# Patient Record
Sex: Male | Born: 1985
Health system: Southern US, Community
[De-identification: ages and names within clinical notes are randomized; demographics above are authoritative.]

## PROBLEM LIST (undated history)

## (undated) DIAGNOSIS — F250 Schizoaffective disorder, bipolar type: Secondary | ICD-10-CM

## (undated) DIAGNOSIS — F259 Schizoaffective disorder, unspecified: Secondary | ICD-10-CM

## (undated) DIAGNOSIS — K069 Disorder of gingiva and edentulous alveolar ridge, unspecified: Secondary | ICD-10-CM

## (undated) DIAGNOSIS — F329 Major depressive disorder, single episode, unspecified: Secondary | ICD-10-CM

## (undated) DIAGNOSIS — F32A Depression, unspecified: Secondary | ICD-10-CM

## (undated) HISTORY — PX: NO PAST SURGERIES: SHX2092

---
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
## 2005-12-18 ENCOUNTER — Emergency Department: Payer: Self-pay | Admitting: Emergency Medicine

## 2009-02-27 ENCOUNTER — Emergency Department (HOSPITAL_COMMUNITY): Admission: EM | Admit: 2009-02-27 | Discharge: 2009-02-27 | Payer: Self-pay | Admitting: Emergency Medicine

## 2009-09-02 ENCOUNTER — Emergency Department (HOSPITAL_COMMUNITY): Admission: EM | Admit: 2009-09-02 | Discharge: 2009-09-02 | Payer: Self-pay | Admitting: Emergency Medicine

## 2009-09-13 ENCOUNTER — Emergency Department (HOSPITAL_COMMUNITY): Admission: EM | Admit: 2009-09-13 | Discharge: 2009-09-13 | Payer: Self-pay | Admitting: Emergency Medicine

## 2011-11-03 ENCOUNTER — Emergency Department (HOSPITAL_COMMUNITY)
Admission: EM | Admit: 2011-11-03 | Discharge: 2011-11-03 | Disposition: A | Discharge status: Home / Self Care | Payer: Self-pay | Attending: Emergency Medicine | Admitting: Emergency Medicine

## 2011-11-03 ENCOUNTER — Emergency Department (HOSPITAL_COMMUNITY): Payer: Self-pay

## 2011-11-03 ENCOUNTER — Encounter (HOSPITAL_COMMUNITY): Payer: Self-pay | Admitting: *Deleted

## 2011-11-03 DIAGNOSIS — Z888 Allergy status to other drugs, medicaments and biological substances status: Secondary | ICD-10-CM | POA: Insufficient documentation

## 2011-11-03 DIAGNOSIS — L02519 Cutaneous abscess of unspecified hand: Secondary | ICD-10-CM | POA: Insufficient documentation

## 2011-11-03 DIAGNOSIS — L03019 Cellulitis of unspecified finger: Secondary | ICD-10-CM | POA: Insufficient documentation

## 2011-11-03 DIAGNOSIS — F17200 Nicotine dependence, unspecified, uncomplicated: Secondary | ICD-10-CM | POA: Insufficient documentation

## 2011-11-03 MED ORDER — HYDROCODONE-IBUPROFEN 7.5-200 MG PO TABS: 1.0000 | ORAL_TABLET | Freq: Four times a day (QID) | ORAL | Status: AC | PRN

## 2011-11-03 MED ORDER — CEPHALEXIN 500 MG PO CAPS: 500.0000 mg | ORAL_CAPSULE | Freq: Four times a day (QID) | ORAL | Status: AC

## 2011-11-03 MED ORDER — LIDOCAINE HCL (PF) 1 % IJ SOLN
INTRAMUSCULAR | Status: AC
  Filled 2011-11-03: qty 5

## 2011-11-03 NOTE — ED Notes (Signed)
Pt states that a piece of wood fell on left index finger on Sunday, pt entire left index finger swollen, redness noted to extend from the tip of finger to first knuckle area. Pt able to move finger slightly due to pain. Cap refill noted.

## 2011-11-03 NOTE — ED Provider Notes (Signed)
History     CSN: [id number]  Arrival date & time 11/03/11  1453   First MD Initiated Contact with Patient 11/03/11 1712      Chief Complaint  Patient presents with  . Finger Injury    (Consider location/radiation/quality/duration/timing/severity/associated sxs/prior treatment) HPI Comments: Dropped piece of wood on finger 2 days ago.  Now is red and swollen.  No fevers or chills.    The history is provided by the patient.    History reviewed. No pertinent past medical history.  History reviewed. No pertinent past surgical history.  No family history on file.  History  Substance Use Topics  . Smoking status: Current Everyday Smoker  . Smokeless tobacco: Not on file  . Alcohol Use: No      Review of Systems  All other systems reviewed and are negative.    Allergies  Other and Tylenol with codeine #3  Home Medications   Current Outpatient Rx  Name Route Sig Dispense Refill  . IBUPROFEN 200 MG PO TABS Oral Take 200-400 mg by mouth daily as needed. For pain      BP 140/83  Pulse 79  Temp 98.3 F (36.8 C)  Resp 20  Ht 5\' 9"  (1.753 m)  Wt 205 lb (92.987 kg)  BMI 30.27 kg/m2  SpO2 99%  Physical Exam  Nursing note and vitals reviewed. Constitutional: He is oriented to person, place, and time. He appears well-developed and well-nourished.  HENT:  Head: Normocephalic and atraumatic.  Musculoskeletal: Normal range of motion.       The left index finger is markedly erythematous and swollen.  There is a small scab on the dorsal aspect of the dip joint, but no obvious splinter or fb.  Neurological: He is alert and oriented to person, place, and time.  Skin: Skin is warm and dry.    ED Course  Procedures (including critical care time)  Labs Reviewed - No data to display Dg Finger Index Left  11/03/2011  *RADIOLOGY REPORT*  Clinical Data: Pain, swelling, redness, injury  LEFT INDEX FINGER 2+V  Comparison: None.  Findings: Diffuse left index finger soft  tissue swelling.  Normal alignment.  No fracture evident.  Preserved joint spaces.  No radiopaque foreign body.  IMPRESSION: Soft tissue swelling.  No acute osseous finding   Original Report Authenticated By: Judie Petit. Ruel Favors, M.D.      No diagnosis found.    MDM  I attempted to drain pus with lidocaine and a #11 blade, however none was expressed.  I doubt a foreign body, but there is definite cellulitis.  Will treat with keflex, lortab, return prn if worsens.        Geoffery Lyons, MD 11/03/11 [phone]

## 2012-06-29 LAB — SALICYLATE LEVEL: Salicylates, Serum: 3.4 mg/dL — ABNORMAL HIGH

## 2012-06-29 LAB — COMPREHENSIVE METABOLIC PANEL
Albumin: 4.4 g/dL (ref 3.4–5.0)
BUN: 13 mg/dL (ref 7–18)
Bilirubin,Total: 0.5 mg/dL (ref 0.2–1.0)
Calcium, Total: 9.4 mg/dL (ref 8.5–10.1)
Chloride: 107 mmol/L (ref 98–107)
EGFR (Non-African Amer.): >60
Potassium: 3.7 mmol/L (ref 3.5–5.1)
SGOT(AST): 33 U/L (ref 15–37)
Sodium: 139 mmol/L (ref 136–145)
Total Protein: 7.7 g/dL (ref 6.4–8.2)

## 2012-06-29 LAB — ETHANOL
Ethanol %: <0.003 % (ref 0.000–0.080)
Ethanol: <3 mg/dL

## 2012-06-29 LAB — CBC
HCT: 43 % (ref 40.0–52.0)
MCH: 31 pg (ref 26.0–34.0)
MCHC: 33.9 g/dL (ref 32.0–36.0)
MCV: 92 fL (ref 80–100)
WBC: 12.5 10*3/uL — ABNORMAL HIGH (ref 3.8–10.6)

## 2012-06-29 LAB — ACETAMINOPHEN LEVEL: Acetaminophen: <2 ug/mL

## 2012-06-29 LAB — TSH: Thyroid Stimulating Horm: 0.44 u[IU]/mL — ABNORMAL LOW

## 2012-06-30 ENCOUNTER — Inpatient Hospital Stay: Payer: Self-pay | Admitting: Psychiatry

## 2012-06-30 LAB — DRUG SCREEN, URINE
Amphetamines, Ur Screen: NEGATIVE (ref ?–1000)
Barbiturates, Ur Screen: NEGATIVE (ref ?–200)
Benzodiazepine, Ur Scrn: NEGATIVE (ref ?–200)
Cannabinoid 50 Ng, Ur ~~LOC~~: NEGATIVE (ref ?–50)
Cocaine Metabolite,Ur ~~LOC~~: NEGATIVE (ref ?–300)
Methadone, Ur Screen: NEGATIVE (ref ?–300)
Phencyclidine (PCP) Ur S: NEGATIVE (ref ?–25)
Tricyclic, Ur Screen: NEGATIVE (ref ?–1000)

## 2012-06-30 LAB — URINALYSIS, COMPLETE
Blood: NEGATIVE
Glucose,UR: NEGATIVE mg/dL (ref 0–75)
Leukocyte Esterase: NEGATIVE
Nitrite: NEGATIVE
Protein: 30
RBC,UR: NONE SEEN /HPF (ref 0–5)
Specific Gravity: 1.035 (ref 1.003–1.030)
Squamous Epithelial: 2

## 2012-07-01 LAB — BEHAVIORAL MEDICINE 1 PANEL
Albumin: 3.8 g/dL (ref 3.4–5.0)
Alkaline Phosphatase: 66 U/L (ref 50–136)
Anion Gap: 7 (ref 7–16)
BUN: 15 mg/dL (ref 7–18)
Basophil #: 0.1 10*3/uL (ref 0.0–0.1)
Basophil %: 1 %
Bilirubin,Total: 0.8 mg/dL (ref 0.2–1.0)
Calcium, Total: 9.1 mg/dL (ref 8.5–10.1)
Chloride: 104 mmol/L (ref 98–107)
Co2: 27 mmol/L (ref 21–32)
Creatinine: 0.78 mg/dL (ref 0.60–1.30)
EGFR (African American): >60
EGFR (Non-African Amer.): >60
Eosinophil #: 0.2 10*3/uL (ref 0.0–0.7)
Eosinophil %: 2.9 %
Glucose: 90 mg/dL (ref 65–99)
HCT: 42.6 % (ref 40.0–52.0)
HGB: 15 g/dL (ref 13.0–18.0)
Lymphocyte #: 2.5 10*3/uL (ref 1.0–3.6)
Lymphocyte %: 35.7 %
MCH: 32.2 pg (ref 26.0–34.0)
MCHC: 35.1 g/dL (ref 32.0–36.0)
MCV: 92 fL (ref 80–100)
Monocyte #: 0.8 x10 3/mm (ref 0.2–1.0)
Monocyte %: 12.3 %
Neutrophil #: 3.3 10*3/uL (ref 1.4–6.5)
Neutrophil %: 48.1 %
Osmolality: 276 (ref 275–301)
Platelet: 232 10*3/uL (ref 150–440)
Potassium: 4.2 mmol/L (ref 3.5–5.1)
RBC: 4.65 10*6/uL (ref 4.40–5.90)
RDW: 13.3 % (ref 11.5–14.5)
SGOT(AST): 42 U/L — ABNORMAL HIGH (ref 15–37)
SGPT (ALT): 24 U/L (ref 12–78)
Sodium: 138 mmol/L (ref 136–145)
Thyroid Stimulating Horm: 0.34 u[IU]/mL — ABNORMAL LOW
Total Protein: 6.9 g/dL (ref 6.4–8.2)
WBC: 6.9 10*3/uL (ref 3.8–10.6)

## 2012-07-01 LAB — URINALYSIS, COMPLETE
Bilirubin,UR: NEGATIVE
Blood: NEGATIVE
Glucose,UR: NEGATIVE mg/dL (ref 0–75)
Ketone: NEGATIVE
Leukocyte Esterase: NEGATIVE
Nitrite: NEGATIVE
Ph: 6 (ref 4.5–8.0)
Protein: 30
RBC,UR: 1 /HPF (ref 0–5)
Specific Gravity: 1.033 (ref 1.003–1.030)
Squamous Epithelial: NONE SEEN
WBC UR: 1 /HPF (ref 0–5)

## 2012-12-28 ENCOUNTER — Emergency Department (HOSPITAL_COMMUNITY)
Admission: EM | Admit: 2012-12-28 | Discharge: 2012-12-28 | Disposition: A | Discharge status: Home / Self Care | Payer: Self-pay | Attending: Emergency Medicine | Admitting: Emergency Medicine

## 2012-12-28 ENCOUNTER — Emergency Department (HOSPITAL_COMMUNITY): Payer: Self-pay

## 2012-12-28 ENCOUNTER — Encounter (HOSPITAL_COMMUNITY): Payer: Self-pay | Admitting: Emergency Medicine

## 2012-12-28 DIAGNOSIS — J3489 Other specified disorders of nose and nasal sinuses: Secondary | ICD-10-CM | POA: Insufficient documentation

## 2012-12-28 DIAGNOSIS — M542 Cervicalgia: Secondary | ICD-10-CM | POA: Insufficient documentation

## 2012-12-28 DIAGNOSIS — R0989 Other specified symptoms and signs involving the circulatory and respiratory systems: Secondary | ICD-10-CM

## 2012-12-28 DIAGNOSIS — F458 Other somatoform disorders: Secondary | ICD-10-CM | POA: Insufficient documentation

## 2012-12-28 DIAGNOSIS — F17200 Nicotine dependence, unspecified, uncomplicated: Secondary | ICD-10-CM | POA: Insufficient documentation

## 2012-12-28 DIAGNOSIS — Z8659 Personal history of other mental and behavioral disorders: Secondary | ICD-10-CM | POA: Insufficient documentation

## 2012-12-28 DIAGNOSIS — R09A2 Foreign body sensation, throat: Secondary | ICD-10-CM

## 2012-12-28 MED ORDER — LORAZEPAM 1 MG PO TABS
1.0000 mg | ORAL_TABLET | Freq: Once | ORAL | Status: AC
  Administered 2012-12-28: 1 mg via ORAL
  Filled 2012-12-28: qty 1

## 2012-12-28 MED ORDER — LORAZEPAM 1 MG PO TABS: 1.0000 mg | ORAL_TABLET | Freq: Two times a day (BID) | ORAL | Status: DC

## 2012-12-28 NOTE — ED Notes (Signed)
Pt brought to room, pt placed in gown, on monitor, continuous pulse oximetry and blood pressure cuff; family at bedside

## 2012-12-28 NOTE — ED Provider Notes (Signed)
CSN: [id number]     Arrival date & time 12/28/12  1348 History   First MD Initiated Contact with Patient 12/28/12 1506     Chief Complaint  Patient presents with  . Shortness of Breath  . Anxiety  . Neck Pain   (Consider location/radiation/quality/duration/timing/severity/associated sxs/prior Treatment) HPI Comments: Patient presents emergency department with chief complaint of neck tightness. Patient states the symptoms began approximately 3 days ago. States that he has had some shortness of breath. Says he feels like someone has a hand on his throat. Denies fever, cough, chest pain.  Endorses nasal congestion.  He has a history of depression and psychosis.  Believes the sensation in his throat is related to his medications.  The history is provided by the patient. No language interpreter was used.    History reviewed. No pertinent past medical history. History reviewed. No pertinent past surgical history. History reviewed. No pertinent family history. History  Substance Use Topics  . Smoking status: Current Every Day Smoker  . Smokeless tobacco: Not on file  . Alcohol Use: No    Review of Systems  All other systems reviewed and are negative.    Allergies  Other and Tylenol with codeine #3  Home Medications   Current Outpatient Rx  Name  Route  Sig  Dispense  Refill  . bismuth subsalicylate (PEPTO BISMOL) 262 MG/15ML suspension   Oral   Take 30 mLs by mouth every 6 (six) hours as needed for indigestion.         [name] ibuprofen (ADVIL,MOTRIN) 200 MG tablet   Oral   Take 200-400 mg by mouth daily as needed. For pain          BP 140/100  Pulse 75  Temp(Src) 97.9 F (36.6 C) (Oral)  Resp 20  Ht 5\' 9"  (1.753 m)  Wt 230 lb (104.327 kg)  BMI 33.95 kg/m2  SpO2 100% Physical Exam  Nursing note and vitals reviewed. Constitutional: He is oriented to person, place, and time. He appears well-developed and well-nourished.  HENT:  Head: Normocephalic and atraumatic.   Right Ear: External ear normal.  Left Ear: External ear normal.  Nose: Nose normal.  Mouth/Throat: Oropharynx is clear and moist. No oropharyngeal exudate.  Airway is clear No exudate or signs of abscess  Eyes: Conjunctivae and EOM are normal. Pupils are equal, round, and reactive to light. Right eye exhibits no discharge. Left eye exhibits no discharge. No scleral icterus.  Neck: Normal range of motion. Neck supple. No JVD present.  Cardiovascular: Normal rate, regular rhythm, normal heart sounds and intact distal pulses.  Exam reveals no gallop and no friction rub.   No murmur heard. Pulmonary/Chest: Effort normal and breath sounds normal. No respiratory distress. He has no wheezes. He has no rales. He exhibits no tenderness.  Abdominal: Soft. He exhibits no distension and no mass. There is no tenderness. There is no rebound and no guarding.  Musculoskeletal: Normal range of motion. He exhibits no edema and no tenderness.  Neurological: He is alert and oriented to person, place, and time. He has normal reflexes.  CN 3-12 intact  Skin: Skin is warm and dry.  Psychiatric: He has a normal mood and affect. His behavior is normal. Judgment and thought content normal.    ED Course  Procedures (including critical care time) No results found for this or any previous visit. Dg Chest 2 View  12/28/2012   CLINICAL DATA:  Difficulty breathing  EXAM: CHEST  2 VIEW  COMPARISON:  None.  FINDINGS: Lungs are clear. Heart size and pulmonary vascularity are normal. No adenopathy. No bone lesions.  IMPRESSION: No abnormality noted.   Electronically Signed   By: Bretta Bang M.D.   On: 12/28/2012 16:02      EKG Interpretation   None     ED ECG REPORT  I personally interpreted this EKG   Date: 12/28/2012   Rate: 73  Rhythm: normal sinus rhythm  QRS Axis: normal  Intervals: normal  ST/T Wave abnormalities: normal  Conduction Disutrbances:none  Narrative Interpretation:   Old EKG Reviewed:  none available    MDM   1. Globus sensation     Patient with throat tightness.  Believe this to be globus.  Will check CXR and give ativan.  Will re-evaluate.  4:20 PM Patient states that he feels very much improved.  Will discharge with a few ativan.  Recommend follow-up with [facility].  Patient and mother understand and agree with the plan.    Roxy Horseman, PA-C 12/28/12 [phone]

## 2012-12-28 NOTE — ED Notes (Signed)
Per pt sts for the last few days since stopping his depression meds he has been feeling tightness in neck, trouble swallowing and SOB. sts that when he feels like this it builds up anxiety. Pt in no acute distress at triage.

## 2012-12-28 NOTE — ED Notes (Signed)
Pt was seen at [facility] last week and placed on meds  Was sseen again on Monday and meds were changed when pt took the meds together like he was suppose to ,  Last  Night ,he felt tight in chest and got sob . Now he has not taken anything and mom was concerned .

## 2012-12-29 NOTE — ED Provider Notes (Signed)
Medical screening examination/treatment/procedure(s) were performed by non-physician practitioner and as supervising physician I was immediately available for consultation/collaboration.  Flint Melter, MD 12/29/12 [phone]

## 2014-01-24 ENCOUNTER — Emergency Department (HOSPITAL_COMMUNITY)
Admission: EM | Admit: 2014-01-24 | Discharge: 2014-01-25 | Disposition: A | Discharge status: Home / Self Care | Payer: Self-pay | Attending: Emergency Medicine | Admitting: Emergency Medicine

## 2014-01-24 ENCOUNTER — Encounter (HOSPITAL_COMMUNITY): Payer: Self-pay | Admitting: *Deleted

## 2014-01-24 DIAGNOSIS — Z79899 Other long term (current) drug therapy: Secondary | ICD-10-CM | POA: Insufficient documentation

## 2014-01-24 DIAGNOSIS — Z720 Tobacco use: Secondary | ICD-10-CM | POA: Insufficient documentation

## 2014-01-24 DIAGNOSIS — K029 Dental caries, unspecified: Secondary | ICD-10-CM | POA: Insufficient documentation

## 2014-01-24 DIAGNOSIS — T1490XA Injury, unspecified, initial encounter: Secondary | ICD-10-CM

## 2014-01-24 DIAGNOSIS — Y9389 Activity, other specified: Secondary | ICD-10-CM | POA: Insufficient documentation

## 2014-01-24 DIAGNOSIS — Y9289 Other specified places as the place of occurrence of the external cause: Secondary | ICD-10-CM | POA: Insufficient documentation

## 2014-01-24 DIAGNOSIS — L03031 Cellulitis of right toe: Secondary | ICD-10-CM | POA: Insufficient documentation

## 2014-01-24 DIAGNOSIS — W228XXA Striking against or struck by other objects, initial encounter: Secondary | ICD-10-CM | POA: Insufficient documentation

## 2014-01-24 DIAGNOSIS — Y998 Other external cause status: Secondary | ICD-10-CM | POA: Insufficient documentation

## 2014-01-24 NOTE — ED Notes (Signed)
The pt is c/o rt foot pain and swelling he injured it 2 days ago,.  Hyperventilating and nauseated.  He was wearing steel toed shoes when he was injured

## 2014-01-25 ENCOUNTER — Emergency Department (HOSPITAL_COMMUNITY): Payer: Self-pay

## 2014-01-25 MED ORDER — OXYCODONE-ACETAMINOPHEN 5-325 MG PO TABS: 2.0000 | ORAL_TABLET | ORAL | Status: DC | PRN

## 2014-01-25 MED ORDER — CEPHALEXIN 500 MG PO CAPS: 1000.0000 mg | ORAL_CAPSULE | Freq: Two times a day (BID) | ORAL | Status: DC

## 2014-01-25 MED ORDER — OXYCODONE-ACETAMINOPHEN 5-325 MG PO TABS
1.0000 | ORAL_TABLET | Freq: Once | ORAL | Status: AC
  Administered 2014-01-25: 1 via ORAL
  Filled 2014-01-25: qty 1

## 2014-01-25 MED ORDER — SULFAMETHOXAZOLE-TRIMETHOPRIM 800-160 MG PO TABS: 1.0000 | ORAL_TABLET | Freq: Two times a day (BID) | ORAL | Status: AC

## 2014-01-25 MED ORDER — KETOROLAC TROMETHAMINE 60 MG/2ML IM SOLN
60.0000 mg | Freq: Once | INTRAMUSCULAR | Status: AC
  Administered 2014-01-25: 60 mg via INTRAMUSCULAR
  Filled 2014-01-25: qty 2

## 2014-01-25 NOTE — ED Provider Notes (Signed)
CSN: [id number]     Arrival date & time 01/24/14  2338 History   First MD Initiated Contact with Patient 01/24/14 2355     Chief Complaint  Patient presents with  . Foot Injury     (Consider location/radiation/quality/duration/timing/severity/associated sxs/prior Treatment) HPI   28 year old male who presents for evaluation of right foot injury. Patient states 2 weeks ago he accidentally stubbed his right great toe against a dresser. He was wearing steel toe shoes at that time.  He denies any significant pain initially.  He has been outside chopping woods for the past few days.  Sts he is getting over a cold.  2 days ago he was slump down when his R feet was pressing against a wall when he notice pain to R foot.  Report sharp throbbing pain worsening with palpation or movement.  He has tried OTC medication but pain is worsen.  He denies fever, chills, numbness.  Denies hx of diabetes or gout.  No ankle or knee pain.   History reviewed. No pertinent past medical history. History reviewed. No pertinent past surgical history. No family history on file. History  Substance Use Topics  . Smoking status: Current Every Day Smoker  . Smokeless tobacco: Not on file  . Alcohol Use: No    Review of Systems  Constitutional: Negative for fever.  Musculoskeletal: Positive for arthralgias.  Skin: Positive for rash.  Neurological: Negative for numbness.      Allergies  Other and Tylenol with codeine #3  Home Medications   Prior to Admission medications   Medication Sig Start Date End Date Taking? Authorizing Provider  ARIPiprazole (ABILIFY) 15 MG tablet Take 15 mg by mouth daily.    Historical Provider, MD  benztropine (COGENTIN) 1 MG tablet Take 1 mg by mouth daily.    Historical Provider, MD  bismuth subsalicylate (PEPTO BISMOL) 262 MG/15ML suspension Take 30 mLs by mouth every 6 (six) hours as needed for indigestion.    Historical Provider, MD  ibuprofen (ADVIL,MOTRIN) 200 MG tablet Take  200-400 mg by mouth daily as needed. For pain    Historical Provider, MD  LORazepam (ATIVAN) 1 MG tablet Take 1 tablet (1 mg total) by mouth 2 (two) times daily. 12/28/12   Roxy Horsemanobert Browning, PA-C  lurasidone (LATUDA) 40 MG TABS tablet Take 40 mg by mouth daily with breakfast.    Historical Provider, MD  traZODone (DESYREL) 100 MG tablet Take 100 mg by mouth at bedtime.    Historical Provider, MD   BP 157/101 mmHg  Pulse 94  Temp(Src) 97.5 F (36.4 C)  Resp 16  Ht 5\' 9"  (1.753 m)  Wt 240 lb (108.863 kg)  BMI 35.43 kg/m2  SpO2 98% Physical Exam  Constitutional: He appears well-developed and well-nourished. No distress.  HENT:  Head: Atraumatic.  Poor dentition with significant dental decay throughout  Eyes: Conjunctivae are normal.  Neck: Normal range of motion. Neck supple.  Musculoskeletal: He exhibits tenderness (R foot: erythema and warmth noted to 1st-2nd-3rd toes involving metatarsals, exquisitely tender to palpation.  no nail involvement,  no gross deformity.  brisk cap refill).  Pedal pulses palpable bilat.  R ankle with FROM.   Neurological: He is alert.  Skin: No rash noted.  Psychiatric: He has a normal mood and affect.    ED Course  Procedures (including critical care time)  12:17 AM R foot infection, suspicious of cellulitis.  Small fissure noted to web of toe between great toe and second toe, likely portal of  entry.  Since pt report prior injury, i will obtain xray.  Pain medication given. Doubt gout or infected joint.    1:39 AM X-rays shows mild soft tissue swelling but no osteomyelitis or acute bony pathology.  Area of cellulitis was marked and dated.  Care instruction provide.  abx include keflex and bactrim along with pain medication were prescribed. Pt to f/u in 48 hrs for wound recheck, return sooner if worsen.    Labs Review Labs Reviewed - No data to display  Imaging Review Dg Foot Complete Right  01/25/2014   CLINICAL DATA:  Right foot injury with  subacute right foot pain and swelling. Initial encounter.  EXAM: RIGHT FOOT COMPLETE - 3+ VIEW  COMPARISON:  None  FINDINGS: There is no evidence of fracture, subluxation or dislocation.  There is no evidence of osteomyelitis.  Medial and dorsal soft tissue swelling is present.  IMPRESSION: Soft tissue swelling without bony abnormality.   Electronically Signed   By: Laveda AbbeJeff  Hu M.D.   On: 01/25/2014 01:27     EKG Interpretation None      MDM   Final diagnoses:  Cellulitis of toe of right foot    BP 157/101 mmHg  Pulse 94  Temp(Src) 97.5 F (36.4 C)  Resp 16  Ht 5\' 9"  (1.753 m)  Wt 240 lb (108.863 kg)  BMI 35.43 kg/m2  SpO2 98%  I have reviewed nursing notes and vital signs. I personally reviewed the imaging tests through PACS system  I reviewed available ER/hospitalization records thought the EMR     Fayrene HelperBowie Terrica Duecker, PA-C 01/25/14 0141  Loren Raceravid Yelverton, MD 01/25/14 [phone]

## 2014-01-25 NOTE — Discharge Instructions (Signed)
Please take both antibiotics for the full Duration. Take pain medication as needed. Keep foot elevated and soak in warm water with Dial antibacterial soap. Follow-up at urgent care center in 2 days for a wound recheck. Return sooner if you notice condition is worsening.  Cellulitis Cellulitis is an infection of the skin and the tissue beneath it. The infected area is usually red and tender. Cellulitis occurs most often in the arms and lower legs.  CAUSES  Cellulitis is caused by bacteria that enter the skin through cracks or cuts in the skin. The most common types of bacteria that cause cellulitis are staphylococci and streptococci. SIGNS AND SYMPTOMS   Redness and warmth.  Swelling.  Tenderness or pain.  Fever. DIAGNOSIS  Your health care provider can usually determine what is wrong based on a physical exam. Blood tests may also be done. TREATMENT  Treatment usually involves taking an antibiotic medicine. HOME CARE INSTRUCTIONS   Take your antibiotic medicine as directed by your health care provider. Finish the antibiotic even if you start to feel better.  Keep the infected arm or leg elevated to reduce swelling.  Apply a warm cloth to the affected area up to 4 times per day to relieve pain.  Take medicines only as directed by your health care provider.  Keep all follow-up visits as directed by your health care provider. SEEK MEDICAL CARE IF:   You notice red streaks coming from the infected area.  Your red area gets larger or turns dark in color.  Your bone or joint underneath the infected area becomes painful after the skin has healed.  Your infection returns in the same area or another area.  You notice a swollen bump in the infected area.  You develop new symptoms.  You have a fever. SEEK IMMEDIATE MEDICAL CARE IF:   You feel very sleepy.  You develop vomiting or diarrhea.  You have a general ill feeling (malaise) with muscle aches and pains. MAKE SURE YOU:     Understand these instructions.  Will watch your condition.  Will get help right away if you are not doing well or get worse. Document Released: 11/19/2004 Document Revised: 06/26/2013 Document Reviewed: 04/27/2011 [facility] Patient Information 2015 [employer], [place]. This information is not intended to replace advice given to you by your health care provider. Make sure you discuss any questions you have with your health care provider.

## 2014-02-10 ENCOUNTER — Emergency Department (HOSPITAL_COMMUNITY)
Admission: EM | Admit: 2014-02-10 | Discharge: 2014-02-10 | Disposition: A | Discharge status: Home / Self Care | Payer: Self-pay | Attending: Emergency Medicine | Admitting: Emergency Medicine

## 2014-02-10 ENCOUNTER — Encounter (HOSPITAL_COMMUNITY): Payer: Self-pay | Admitting: Emergency Medicine

## 2014-02-10 ENCOUNTER — Emergency Department (HOSPITAL_COMMUNITY): Payer: Self-pay

## 2014-02-10 DIAGNOSIS — R52 Pain, unspecified: Secondary | ICD-10-CM

## 2014-02-10 DIAGNOSIS — M109 Gout, unspecified: Secondary | ICD-10-CM

## 2014-02-10 DIAGNOSIS — M79674 Pain in right toe(s): Secondary | ICD-10-CM | POA: Insufficient documentation

## 2014-02-10 DIAGNOSIS — Z720 Tobacco use: Secondary | ICD-10-CM | POA: Insufficient documentation

## 2014-02-10 DIAGNOSIS — M79671 Pain in right foot: Secondary | ICD-10-CM | POA: Insufficient documentation

## 2014-02-10 DIAGNOSIS — R110 Nausea: Secondary | ICD-10-CM | POA: Insufficient documentation

## 2014-02-10 DIAGNOSIS — Z792 Long term (current) use of antibiotics: Secondary | ICD-10-CM | POA: Insufficient documentation

## 2014-02-10 DIAGNOSIS — M10071 Idiopathic gout, right ankle and foot: Secondary | ICD-10-CM | POA: Insufficient documentation

## 2014-02-10 DIAGNOSIS — Z79899 Other long term (current) drug therapy: Secondary | ICD-10-CM | POA: Insufficient documentation

## 2014-02-10 LAB — I-STAT CHEM 8, ED
BUN: 10 mg/dL (ref 6–23)
CALCIUM ION: 1.23 mmol/L (ref 1.12–1.23)
CREATININE: 0.9 mg/dL (ref 0.50–1.35)
Chloride: 104 mEq/L (ref 96–112)
Glucose, Bld: 120 mg/dL — ABNORMAL HIGH (ref 70–99)
HCT: 46 % (ref 39.0–52.0)
HEMOGLOBIN: 15.6 g/dL (ref 13.0–17.0)
Potassium: 4.1 mEq/L (ref 3.7–5.3)
Sodium: 142 mEq/L (ref 137–147)
TCO2: 22 mmol/L (ref 0–100)

## 2014-02-10 MED ORDER — COLCHICINE 0.6 MG PO TABS
1.2000 mg | ORAL_TABLET | ORAL | Status: AC
  Administered 2014-02-10: 1.2 mg via ORAL
  Filled 2014-02-10: qty 2

## 2014-02-10 MED ORDER — INDOMETHACIN 25 MG PO CAPS: 50.0000 mg | ORAL_CAPSULE | Freq: Three times a day (TID) | ORAL | Status: DC | PRN

## 2014-02-10 NOTE — ED Notes (Signed)
Pt. Stated, I have an infected foot.  I had a sore underneath my toe and the sweat and all caused it to get infected according to the doctor that was here.  Now its really hurting and infected. Its affected my whole foot.

## 2014-02-10 NOTE — ED Provider Notes (Signed)
CSN: [id number]     Arrival date & time 02/10/14  1434 History  This chart was scribed for non-physician practitioner working with Nelia Shiobert L Beaton, MD by Elveria Risingimelie Horne, ED Scribe. This patient was seen in room TR09C/TR09C and the patient's care was started at 2:48 PM.   Chief Complaint  Patient presents with  . Foot Pain   The history is provided by the patient. No language interpreter was used.   HPI Comments: Sheppard EvensJonathan L Kenealy is a 28 y.o. male who presents to the Emergency Department complaining of right foot pain. Patient reports a past callus that developed into an open wound. Patient states he was evaluated three weeks ago and treated for MRSA infection; patient discharged with 10 day course of Keflex. Patient endorses taking the antibiotics are directed. Today patient reports pain primarily located at his right great toe. Patient reports exacerbated pain with applied pressure and presents with his shoe removed. Patient denies heel, leg or calf involvement. Patient reports nausea, but denies vomiting or fever. Patient denies known kidney problems. Patient denies history of HTN or taking any medications regularly.  History reviewed. No pertinent past medical history. History reviewed. No pertinent past surgical history. No family history on file. History  Substance Use Topics  . Smoking status: Current Every Day Smoker  . Smokeless tobacco: Not on file  . Alcohol Use: No    Review of Systems  Constitutional: Negative for fever and chills.  Gastrointestinal: Positive for nausea. Negative for vomiting and diarrhea.  Musculoskeletal: Positive for arthralgias.       Joint pain  Skin: Positive for color change and wound.  Neurological: Negative for weakness and numbness.    Allergies  Other and Tylenol with codeine #3  Home Medications   Prior to Admission medications   Medication Sig Start Date End Date Taking? Authorizing Provider  ARIPiprazole (ABILIFY) 15 MG tablet Take 15 mg  by mouth daily.    Historical Provider, MD  benztropine (COGENTIN) 1 MG tablet Take 1 mg by mouth daily.    Historical Provider, MD  bismuth subsalicylate (PEPTO BISMOL) 262 MG/15ML suspension Take 30 mLs by mouth every 6 (six) hours as needed for indigestion.    Historical Provider, MD  cephALEXin (KEFLEX) 500 MG capsule Take 2 capsules (1,000 mg total) by mouth 2 (two) times daily. 01/25/14   Fayrene HelperBowie Tran, PA-C  ibuprofen (ADVIL,MOTRIN) 200 MG tablet Take 200-400 mg by mouth daily as needed. For pain    Historical Provider, MD  LORazepam (ATIVAN) 1 MG tablet Take 1 tablet (1 mg total) by mouth 2 (two) times daily. 12/28/12   Roxy Horsemanobert Browning, PA-C  lurasidone (LATUDA) 40 MG TABS tablet Take 40 mg by mouth daily with breakfast.    Historical Provider, MD  oxyCODONE-acetaminophen (PERCOCET/ROXICET) 5-325 MG per tablet Take 2 tablets by mouth every 4 (four) hours as needed for severe pain. 01/25/14   Fayrene HelperBowie Tran, PA-C  traZODone (DESYREL) 100 MG tablet Take 100 mg by mouth at bedtime.    Historical Provider, MD   Triage Vitals: BP 134/95 mmHg  Pulse 99  Temp(Src) 97.4 F (36.3 C) (Oral)  Resp 17  Ht 5\' 9"  (1.753 m)  Wt 240 lb (108.863 kg)  BMI 35.43 kg/m2  SpO2 100% Physical Exam  Constitutional: He is oriented to person, place, and time. He appears well-developed and well-nourished. No distress.  HENT:  Head: Normocephalic and atraumatic.  Eyes: EOM are normal.  Neck: Neck supple. No tracheal deviation present.  Cardiovascular: Normal  rate.   Pulmonary/Chest: Effort normal. No respiratory distress.  Musculoskeletal:       Feet:  Neurological: He is alert and oriented to person, place, and time.  Skin: Skin is warm and dry.  Psychiatric: He has a normal mood and affect. His behavior is normal.  Nursing note and vitals reviewed.   ED Course  Procedures (including critical care time)  COORDINATION OF CARE: 3:15 PM- Patient's pain consistent with gout, not an infection. patient will be  treated accordingly. Discussed treatment plan with patient at bedside and patient agreed to plan.   Labs Review Labs Reviewed - No data to display  Imaging Review Dg Foot Complete Right  02/10/2014   CLINICAL DATA:  Three-week history of pain and redness  EXAM: RIGHT FOOT COMPLETE - 3+ VIEW  COMPARISON:  January 25, 2014  FINDINGS: Frontal, oblique, and lateral views were obtained. There is no fracture or dislocation. Joint spaces appear intact. No erosive change or bony destruction. There is apparent soft tissue edema along the volar aspect of the proximal to mid foot. No demonstrable soft tissue abscess. There is a small bone island in the distal aspect of the first distal phalanx.  IMPRESSION: Soft tissue swelling along the volar aspect of the proximal to midfoot region. No abscess appreciable. No bony abnormality. In particular, no fracture or dislocation. No bony destruction. Joint spaces appear intact.   Electronically Signed   By: Bretta BangWilliam  Woodruff M.D.   On: 02/10/2014 16:32     EKG Interpretation None      MDM   Final diagnoses:  Pain  Acute gout of right foot, unspecified cause   28 yo with monoarticular pain, but only minimal swelling and erythema.  He was treated for cellulitis in the past but this seems resolved.  Due to the amount of pain ellicted in the right great toe without evidence of fracture on imaging, will treat as gout as I doubt any joint infection.  He is afebrile and stable. He has good renal function and no known peptic ulcer disease and not receiving concurrent treatment on warfarin. Colchicine given in the ED with instructions to start indomethacin. Discussed that pt should respond to treatment with in 24 hour of begining treatment & likely resolve in 2-3 days. Resources provided to establish care with PCP for follow-up.   I personally performed the services described in this documentation, which was scribed in my presence. The recorded information has been  reviewed and is accurate.  Filed Vitals:   02/10/14 1443 02/10/14 1628  BP: 134/95 138/86  Pulse: 99 86  Temp: 97.4 F (36.3 C) 98.9 F (37.2 C)  TempSrc: Oral Oral  Resp: 17 18  Height: 5\' 9"  (1.753 m)   Weight: 240 lb (108.863 kg)   SpO2: 100% 97%   Meds given in ED:  Medications  colchicine tablet 1.2 mg (1.2 mg Oral Given 02/10/14 1628)    Discharge Medication List as of 02/10/2014  4:34 PM     02/10/14 0000  indomethacin (INDOCIN) 25 MG capsule 3 times daily PRN Discontinue Reprint 02/10/14 1636           Harle BattiestElizabeth Rosalio Catterton, NP 02/12/14 [id number]  Nelia Shiobert L Beaton, MD 02/19/14 1511

## 2014-02-10 NOTE — Discharge Instructions (Signed)
Please follow directions provided. Use the referral or the resource guide provided to establish care with a primary care doctor so you can follow-up on this pain. Take the indomethacin as directed. Avoid aspirin, alcohol, or red meats as this may cause another flareup of this pain. Don't hesitate to return for any new, worsening, or concerning symptoms.  SEEK MEDICAL CARE IF:  You develop diarrhea, vomiting, or any side effects from medicines.  You do not feel better in 24 hours, or you are getting worse. SEEK IMMEDIATE MEDICAL CARE IF:  Your joint becomes suddenly more tender, and you have chills or a fever.   Emergency Department Resource Guide 1) Find a Doctor and Pay Out of Pocket Although you won't have to find out who is covered by your insurance plan, it is a good idea to ask around and get recommendations. You will then need to call the office and see if the doctor you have chosen will accept you as a new patient and what types of options they offer for patients who are self-pay. Some doctors offer discounts or will set up payment plans for their patients who do not have insurance, but you will need to ask so you aren't surprised when you get to your appointment.  2) Contact Your Local Health Department Not all health departments have doctors that can see patients for sick visits, but many do, so it is worth a call to see if yours does. If you don't know where your local health department is, you can check in your phone book. The CDC also has a tool to help you locate your state's health department, and many state websites also have listings of all of their local health departments.  3) Find a Walk-in Clinic If your illness is not likely to be very severe or complicated, you may want to try a walk in clinic. These are popping up all over the country in pharmacies, drugstores, and shopping centers. They're usually staffed by nurse practitioners or physician assistants that have been trained  to treat common illnesses and complaints. They're usually fairly quick and inexpensive. However, if you have serious medical issues or chronic medical problems, these are probably not your best option.  No Primary Care Doctor: - Call Health Connect at  [phone] - they can help you locate a primary care doctor that  accepts your insurance, provides certain services, etc. - Physician Referral Service- [phone]  Chronic Pain Problems: Organization         Address  Phone   Notes  [facility]  [phone] Patients need to be referred by their primary care doctor.   Medication Assistance: Organization         Address  Phone   Notes  [facility] Medication [facility] [address] [place], [place] [address] [phone] --Must be a resident of [facility] [place] -- Must have NO insurance coverage whatsoever (no Medicaid/ Medicare, etc.) -- The pt. MUST have a primary care doctor that directs their care regularly and follows them in the community   MedAssist  [phone]   [employer]  [phone]    Agencies that provide inexpensive medical care: Organization         Address  Phone   Notes  [facility]  [phone]   [facility]    [phone]   [facility] [address] [place], [place] [address] [phone]  [facility] [address] [place] [phone]   Planned Parenthood    [phone]   [facility]    [phone]   [place] [address], [place] Phone:  [phone], Fax:  [phone] Hours of Operation:  9 am - 6 pm, M-F.  Also accepts Medicaid/Medicare and self-pay.  [facility] for [place], [address], [place] Phone: [phone], Fax: [phone]. Hours of Operation:  8:30 am - 5:30 pm, M-F.  Also accepts Medicaid and self-pay.   [place] [address], [place] Phone: [phone]   [place], [place], [place] [phone], Ext. [phone] Mondays & Thursdays: 7-9 AM.  First 15 patients are seen on a first come, first serve basis.    [place] Providers:  Organization         Address  Phone   Notes  [facility] [address], [place] [phone] Also accepts self-pay patients.  [facility] [address], [place]  [phone]   [place], [address], [place] [phone]   [facility] [address] [place] [phone]   Lucianne Lei [address], [place]   [phone] Only accepts [place] Access [place] patients after they have their name applied to their card.   Self-Pay (no insurance) in [facility]:  Organization         Address  Phone   Notes  Sickle Cell Patients, [facility] [place] [phone]   [facility] [place] [phone]   [facility] [place]  [place], [place], [place] [phone]   [facility]/Dr. Osei-Bonsu  [address], [place] or [address], [place] [phone] Phone number for both [place] and [place] locations is the same.  [facility]gent [facility] and [facility] [address] [place] [phone]   [facility] [address], [place] or [address] [phone]   [facility] [address], [place] [phone], phone; [phone], fax Sees patients 1st and 3rd Saturday of every month.  Must not qualify for public or private insurance (i.e. Medicaid, Medicare, [place] Health Choice, Veterans' Benefits)  Household income should be no  more than 200% of the poverty level The clinic cannot treat you if you are pregnant or think you are pregnant  Sexually transmitted diseases are not treated at the clinic.    Dental Care: Organization         Address  Phone  Notes  [facility] of [facility] [phone] Accepts children up to age 58 who are enrolled in [place] or [place]; pregnant women with a Medicaid card; and children who have applied for Medicaid or  Health Choice, but were declined, whose parents can pay a reduced fee at time of service.  [facility] Department of [facility]  [address], [place] [phone] Accepts children up to age 85 who are enrolled in [place] or [place]; pregnant women with a Medicaid card; and  children who have applied for Medicaid or [place] Health Choice, but were declined, whose parents can pay a reduced fee at time of service.  [place] [facility] PRO[facility]  [place] [phone] Patients are seen by appointment only. Walk-ins are not accepted. [place] will see patients 78 years of age and older. Monday - Tuesday (8am-5pm) Most Wednesdays (8:30-5pm) $30 per visit, cash only  [facility] Access PROGRAM  [address], [facility] [phone] Patients are seen by appointment only. Walk-ins are not accepted. [place] will see patients 50 years of age and older. One Wednesday Evening (Monthly: Volunteer Based).  $30 per visit, cash only  [place]  [phone] for adults; Children under age 19, call Graduate Pediatric Dentistry at [phone]. Children aged 73-14, please call [phone] to request a pediatric application.  Dental services are provided in all areas of dental care including fillings, crowns and bridges, complete and partial dentures, implants, gum treatment, root canals,  and extractions. Preventive care is also provided. Treatment is provided to both adults and children. Patients are selected via a lottery and there is often a waiting list.   [facility] [address], [place]  [phone] [email or web address]   [facility] [address] [place], [place] [phone], Ext. [phone] Second and Fourth Thursday of each month, opens at 6:30 AM; Clinic ends at 9 AM.  Patients are seen on a first-come first-served basis, and a limited number are seen during each clinic.   [facility]  [address] Hillard Danker [place], [place] [phone]   Eligibility Requirements You must have lived in [place], [place], or [place] counties for at least the last three months.   You cannot be eligible for state or federal sponsored [employer], including [employer], [place], or [employer].   You generally cannot be eligible for healthcare insurance through your employer.    How to apply: Eligibility screenings are held every Tuesday and Wednesday afternoon from 1:00 pm until 4:00 pm. You do not need an appointment for the interview!  [facility] [address], [place], [place]   [place]  [place] Department  [place]  [phone]    Behavioral Health Resources in the Community: Intensive Outpatient Programs Organization         Address  Phone  Notes  [place]. [address] [place], [place] [phone]   [facility]patient [address] [place], [place]   ADS: Alcohol & Drug Svcs [address], [place], [place]   [place] [address]  [place], [place] or [phone]   Substance Abuse Resources Organization         Address  Phone  Notes  Alcohol and Drug Services  [phone]    [place]  [phone]   [facility] [place]   Chinita Pester  [phone]   Residential & Outpatient Substance Abuse Program  [phone]   Psychological Services Organization         Address  Phone  Notes  [facility] [place]  [place]  [phone]   [place] [address], [place] or [phone]    Mobile Crisis Teams Organization  Address  Phone  Notes  [facility], Mobile Crisis Care Unit  [phone]   [facility]  [address] [place], [place]   [facility] [address], [place] [phone]    Self-Help/Support Groups Organization         Address  Phone             Notes  [place]. of [place] - variety of support groups  [place] Call for more information  Narcotics Anonymous (NA), [facility] [address], [employer] [place]  2 meetings at this location   Special educational needs teacher         Address  Phone  Notes  [facility] Residential Treatment [place],    [place]  [phone]   [facility]  [address], [place] [address], [place], [place]   [place], [place] [phone] Admissions: 8am-3pm M-F  Incentives Substance [place] [address]    [place], [place] [phone]   [facility] [address] [place], [place], [place]   [facility] [address],  [place], [place]   [facility] - Intensive Outpatient [address], [place], [place]   [facility] ([place]  [place], [place] [phone] or [phone]   [facility] Services (RTS) [address], [place], [place] Accepts Medicaid  [facility] [place] [address],    [place] [phone] Substance Abuse/Addiction Treatment   [facility] Organization         Address  Phone  Notes  [facility]  [phone]   Domenic Schwab, PhD [address] Arlis [place], [place]   [phone] or [phone]   [place], [place] [phone]   [facility] [address], [place], [place] [phone] Insurance/Medicaid/sponsorship through [facility] and Families [address], [address] [place]                                    [place], [place] [phone] [place] [address], [place] [phone]    Dr. Adele Schilder  [phone]   [facility] Dept. 1) [address], [place] 2) [place] 3)  [address], [place] [phone]  [phone]   [place] [phone] or [phone] (After Hours)

## 2014-02-11 ENCOUNTER — Encounter (HOSPITAL_COMMUNITY): Payer: Self-pay

## 2014-02-11 ENCOUNTER — Emergency Department (HOSPITAL_COMMUNITY)
Admission: EM | Admit: 2014-02-11 | Discharge: 2014-02-11 | Disposition: A | Discharge status: Home / Self Care | Payer: Self-pay | Attending: Emergency Medicine | Admitting: Emergency Medicine

## 2014-02-11 DIAGNOSIS — M79671 Pain in right foot: Secondary | ICD-10-CM

## 2014-02-11 MED ORDER — KETOROLAC TROMETHAMINE 60 MG/2ML IM SOLN
30.0000 mg | Freq: Once | INTRAMUSCULAR | Status: AC
  Administered 2014-02-11: 30 mg via INTRAMUSCULAR
  Filled 2014-02-11: qty 2

## 2014-02-11 MED ORDER — TRAMADOL HCL 50 MG PO TABS: 50.0000 mg | ORAL_TABLET | Freq: Four times a day (QID) | ORAL | Status: DC | PRN

## 2014-02-11 NOTE — Discharge Instructions (Signed)
Please follow up with your primary care physician in 1-2 days. If you do not have one please call the [facility] number listed above. Please fill your Indomethacin prescription. Please take pain medication and/or muscle relaxants as prescribed and as needed for pain. Please do not drive on narcotic pain medication or on muscle relaxants.  Please read all discharge instructions and return precautions.   Gout Gout is an inflammatory arthritis caused by a buildup of uric acid crystals in the joints. Uric acid is a chemical that is normally present in the blood. When the level of uric acid in the blood is too high it can form crystals that deposit in your joints and tissues. This causes joint redness, soreness, and swelling (inflammation). Repeat attacks are common. Over time, uric acid crystals can form into masses (tophi) near a joint, destroying bone and causing disfigurement. Gout is treatable and often preventable. CAUSES  The disease begins with elevated levels of uric acid in the blood. Uric acid is produced by your body when it breaks down a naturally found substance called purines. Certain foods you eat, such as meats and fish, contain high amounts of purines. Causes of an elevated uric acid level include:  Being passed down from parent to child (heredity).  Diseases that cause increased uric acid production (such as obesity, psoriasis, and certain cancers).  Excessive alcohol use.  Diet, especially diets rich in meat and seafood.  Medicines, including certain cancer-fighting medicines (chemotherapy), water pills (diuretics), and aspirin.  Chronic kidney disease. The kidneys are no longer able to remove uric acid well.  Problems with metabolism. Conditions strongly associated with gout include:  Obesity.  High blood pressure.  High cholesterol.  Diabetes. Not everyone with elevated uric acid levels gets gout. It is not understood why some people get gout and  others do not. Surgery, joint injury, and eating too much of certain foods are some of the factors that can lead to gout attacks. SYMPTOMS   An attack of gout comes on quickly. It causes intense pain with redness, swelling, and warmth in a joint.  Fever can occur.  Often, only one joint is involved. Certain joints are more commonly involved:  Base of the big toe.  Knee.  Ankle.  Wrist.  Finger. Without treatment, an attack usually goes away in a few days to weeks. Between attacks, you usually will not have symptoms, which is different from many other forms of arthritis. DIAGNOSIS  Your caregiver will suspect gout based on your symptoms and exam. In some cases, tests may be recommended. The tests may include:  Blood tests.  Urine tests.  X-rays.  Joint fluid exam. This exam requires a needle to remove fluid from the joint (arthrocentesis). Using a microscope, gout is confirmed when uric acid crystals are seen in the joint fluid. TREATMENT  There are two phases to gout treatment: treating the sudden onset (acute) attack and preventing attacks (prophylaxis).  Treatment of an Acute Attack.  Medicines are used. These include anti-inflammatory medicines or steroid medicines.  An injection of steroid medicine into the affected joint is sometimes necessary.  The painful joint is rested. Movement can worsen the arthritis.  You may use warm or cold treatments on painful joints, depending which works best for you.  Treatment to Prevent Attacks.  If you suffer from frequent gout attacks, your caregiver may advise preventive medicine. These medicines are started after the acute attack subsides. These medicines either help your kidneys eliminate uric  acid from your body or decrease your uric acid production. You may need to stay on these medicines for a very long time.  The early phase of treatment with preventive medicine can be associated with an increase in acute gout attacks. For  this reason, during the first few months of treatment, your caregiver may also advise you to take medicines usually used for acute gout treatment. Be sure you understand your caregiver's directions. Your caregiver may make several adjustments to your medicine dose before these medicines are effective.  Discuss dietary treatment with your caregiver or dietitian. Alcohol and drinks high in sugar and fructose and foods such as meat, poultry, and seafood can increase uric acid levels. Your caregiver or dietitian can advise you on drinks and foods that should be limited. HOME CARE INSTRUCTIONS   Do not take aspirin to relieve pain. This raises uric acid levels.  Only take over-the-counter or prescription medicines for pain, discomfort, or fever as directed by your caregiver.  Rest the joint as much as possible. When in bed, keep sheets and blankets off painful areas.  Keep the affected joint raised (elevated).  Apply warm or cold treatments to painful joints. Use of warm or cold treatments depends on which works best for you.  Use crutches if the painful joint is in your leg.  Drink enough fluids to keep your urine clear or pale yellow. This helps your body get rid of uric acid. Limit alcohol, sugary drinks, and fructose drinks.  Follow your dietary instructions. Pay careful attention to the amount of protein you eat. Your daily diet should emphasize fruits, vegetables, whole grains, and fat-free or low-fat milk products. Discuss the use of coffee, vitamin C, and cherries with your caregiver or dietitian. These may be helpful in lowering uric acid levels.  Maintain a healthy body weight. SEEK MEDICAL CARE IF:   You develop diarrhea, vomiting, or any side effects from medicines.  You do not feel better in 24 hours, or you are getting worse. SEEK IMMEDIATE MEDICAL CARE IF:   Your joint becomes suddenly more tender, and you have chills or a fever. MAKE SURE YOU:   Understand these  instructions.  Will watch your condition.  Will get help right away if you are not doing well or get worse. Document Released: 02/07/2000 Document Revised: 06/26/2013 Document Reviewed: 09/23/2011 [facility] Patient Information 2015 [place], [place]. This information is not intended to replace advice given to you by your health care provider. Make sure you discuss any questions you have with your health care provider.

## 2014-02-11 NOTE — ED Notes (Signed)
Pt reports right foot pain and swelling.  Pt was seen in ED earlier today and given prescriptions that he hasn't gotten filled yet.

## 2014-02-11 NOTE — ED Provider Notes (Signed)
CSN: [id number]     Arrival date & time 02/10/14  2359 History   First MD Initiated Contact with Patient 02/11/14 0005     Chief Complaint  Patient presents with  . Foot Swelling     (Consider location/radiation/quality/duration/timing/severity/associated sxs/prior Treatment) HPI Comments: Patient is a 28 year old male past medical history significant for tobacco abuse presenting to the emergency department for reevaluation of right great toe pain after being discharged from the emergency department 8 hours prior. States he has not filled his pain medication, stating "I just went home to sleep instead." Denies any injuries or falls. Denies any fevers or chills. No other complaints.   History reviewed. No pertinent past medical history. History reviewed. No pertinent past surgical history. History reviewed. No pertinent family history. History  Substance Use Topics  . Smoking status: Current Every Day Smoker  . Smokeless tobacco: Not on file  . Alcohol Use: No    Review of Systems  Musculoskeletal: Positive for arthralgias.  All other systems reviewed and are negative.     Allergies  Other and Tylenol with codeine #3  Home Medications   Prior to Admission medications   Medication Sig Start Date End Date Taking? Authorizing Provider  ARIPiprazole (ABILIFY) 15 MG tablet Take 15 mg by mouth daily.    Historical Provider, MD  benztropine (COGENTIN) 1 MG tablet Take 1 mg by mouth daily.    Historical Provider, MD  bismuth subsalicylate (PEPTO BISMOL) 262 MG/15ML suspension Take 30 mLs by mouth every 6 (six) hours as needed for indigestion.    Historical Provider, MD  cephALEXin (KEFLEX) 500 MG capsule Take 2 capsules (1,000 mg total) by mouth 2 (two) times daily. 01/25/14   Fayrene HelperBowie Tran, PA-C  ibuprofen (ADVIL,MOTRIN) 200 MG tablet Take 200-400 mg by mouth daily as needed. For pain    Historical Provider, MD  indomethacin (INDOCIN) 25 MG capsule Take 2 capsules (50 mg total) by  mouth 3 (three) times daily as needed. 02/10/14   Harle BattiestElizabeth Tysinger, NP  LORazepam (ATIVAN) 1 MG tablet Take 1 tablet (1 mg total) by mouth 2 (two) times daily. 12/28/12   Roxy Horsemanobert Browning, PA-C  lurasidone (LATUDA) 40 MG TABS tablet Take 40 mg by mouth daily with breakfast.    Historical Provider, MD  oxyCODONE-acetaminophen (PERCOCET/ROXICET) 5-325 MG per tablet Take 2 tablets by mouth every 4 (four) hours as needed for severe pain. 01/25/14   Fayrene HelperBowie Tran, PA-C  traMADol (ULTRAM) 50 MG tablet Take 1 tablet (50 mg total) by mouth every 6 (six) hours as needed. 02/11/14   Ji Feldner L Daisha Filosa, PA-C  traZODone (DESYREL) 100 MG tablet Take 100 mg by mouth at bedtime.    Historical Provider, MD   BP 152/88 mmHg  Pulse 98  Temp(Src) 97.7 F (36.5 C) (Oral)  Resp 18  SpO2 96% Physical Exam  Constitutional: He is oriented to person, place, and time. He appears well-developed and well-nourished. No distress.  HENT:  Head: Normocephalic and atraumatic.  Right Ear: External ear normal.  Left Ear: External ear normal.  Nose: Nose normal.  Mouth/Throat: Oropharynx is clear and moist.  Eyes: Conjunctivae are normal.  Neck: Normal range of motion. Neck supple.  Cardiovascular: Normal rate, regular rhythm, normal heart sounds and intact distal pulses.   Pulmonary/Chest: Effort normal.  Abdominal: Soft.  Musculoskeletal: Normal range of motion. He exhibits tenderness.       Right ankle: Normal.       Left ankle: Normal.  Right foot: There is tenderness.       Left foot: Normal.  Tenderness to palpation. Worsening pain to right great toe.   Neurological: He is alert and oriented to person, place, and time.  Skin: Skin is warm and dry. He is not diaphoretic.  Psychiatric: He has a normal mood and affect.  Nursing note and vitals reviewed.   ED Course  Procedures (including critical care time) Medications  ketorolac (TORADOL) injection 30 mg (30 mg Intramuscular Given 02/11/14 0026)     Labs Review Labs Reviewed - No data to display  Imaging Review Dg Foot Complete Right  02/10/2014   CLINICAL DATA:  Three-week history of pain and redness  EXAM: RIGHT FOOT COMPLETE - 3+ VIEW  COMPARISON:  January 25, 2014  FINDINGS: Frontal, oblique, and lateral views were obtained. There is no fracture or dislocation. Joint spaces appear intact. No erosive change or bony destruction. There is apparent soft tissue edema along the volar aspect of the proximal to mid foot. No demonstrable soft tissue abscess. There is a small bone island in the distal aspect of the first distal phalanx.  IMPRESSION: Soft tissue swelling along the volar aspect of the proximal to midfoot region. No abscess appreciable. No bony abnormality. In particular, no fracture or dislocation. No bony destruction. Joint spaces appear intact.   Electronically Signed   By: Bretta BangWilliam  Woodruff M.D.   On: 02/10/2014 16:32     EKG Interpretation None      MDM   Final diagnoses:  Right foot pain    Filed Vitals:   02/11/14 0006  BP: 152/88  Pulse: 98  Temp: 97.7 F (36.5 C)  Resp: 18   Afebrile, NAD, non-toxic appearing, AAOx4.  Neurovascularly intact. Normal sensation. No evidence of compartment syndrome. Reviewed chart and images from earlier today. Discussed importance of taking medications prescribed to help symptoms. Advised PCP follow-up. Return precautions were discussed. Patient is agreeable to plan. Patient stable at time of discharge.    Jeannetta EllisJennifer L Landi Biscardi, PA-C 02/11/14 0101  Dione Boozeavid Glick, MD 02/11/14 [phone]

## 2014-03-08 ENCOUNTER — Ambulatory Visit: Payer: Self-pay

## 2014-06-15 NOTE — H&P (Signed)
PATIENT NAME:  SEAVER, MACHIA MR#:  [id number] DATE OF BIRTH:  1985/06/17  DATE OF ADMISSION:  06/30/2012  IDENTIFYING INFORMATION: A 29 year old man brought into the Emergency Room by his family with psychotic symptoms.   CHIEF COMPLAINT: "I've got issues some and stuff."   HISTORY OF PRESENT ILLNESS: Information was obtained from the patient and the chart. The patient was brought to the hospital yesterday by his family. The family gave the history that for about the last 5 days he had not been sleeping. He had been pacing in the house, praying openly, acting strangely, telling people not to get near him. He began in the middle of the night to talk about being possessed by a demon. There is no known precipitant for this. The patient denies any recent new trauma or stress in his life. There is no known history of substance abuse. No known medical problems. The patient is a poor historian right now. He says vague things such as that he has past issues and he needs to work through them. When I asked him if he could clarify some of that, he mumbles to himself and shows bizarre behaviors but does not give any details. The patient will not, in talking with me today, admit anything about having been possessed by a demon and or any of his hyper-religious thinking. I was present in the Emergency Room yesterday when he was brought in, and he was extraordinarily agitated at that time. He was screaming at the top of his lungs over and over, holding a Bible to his face, stated over and over that he was in hell or had recently been to hell and that there were demons possessing him. He required several police officers to restrain him to get medication administered yesterday.   PAST PSYCHIATRIC HISTORY: No known past psychiatric history. The patient denies ever having been in a psychiatric hospital. No history of psychiatric medication or diagnosis. He denies any history of suicide attempts or violence. I have tried to  reach the family and so far have not been able to get through to anyone.   SOCIAL HISTORY: The patient evidently is unmarried, says he has no children. He is living with his mother, just the two of them at home. He has not been working recently. 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 It is not clear why he has not been working recently. He has a revoked driver's license which he says is because of a failure to appear in court but did not have anything to do with a driving under the influence charge.   PAST MEDICAL HISTORY: Denies any history of any significant medical problems. No history of surgery.   SUBSTANCE ABUSE HISTORY: The patient says he used to drink, but he gave it up this last December. He never had any history that he describes of the detox problems anyway. He denies that he abuses any other drugs.   REVIEW OF SYSTEMS: The patient again is a very poor historian right now and has no specific complaints. He denies feeling depressed. He denies to me that he is having auditory or visual hallucinations. Denies suicidal or homicidal ideation. Denies any physical symptoms.   CURRENT MEDICATIONS: None.   ALLERGIES: No known drug allergies.   MENTAL STATUS EXAM: Currently, the patient is slightly disheveled but less so than he was yesterday. He is dressed in the behavioral health hospital pajamas. He was passively  cooperative with the interview. He made good eye contact. Psychomotor activity was very slow. He tended to sit very still throughout the interview. Affect was flat. Speech was decreased in amount. He made very minimal answers to questions, tended to mumble at times. The patient showed several bizarre behaviors during the interview. When he first sat down with me, he angrily told me that I needed to rearrange my body in several specific ways.  When I declined to do this, however, he withdrew and became flat again. At one point  during the interview, he reached over and gently swatted the back of my hand and at another point leaned over and seemed to pick something out of the air. It looked very clearly like someone who is having visual hallucinations, but he denied that that was what it was, and he could not provide any other explanation for it. He denies suicidal or homicidal ideation. He is denying any hallucinations. His thoughts appear to be disorganized, based on what we saw yesterday presumably having delusions and hallucinations. He is still not able to provide lucid speech at this time. Judgment and insight are poor, unable to gauge intelligence, although it probably is in the normal range.   PHYSICAL EXAMINATION: GENERAL: The patient does not appear to be in any physical distress.  SKIN: No skin lesions identified.  HEENT: Pupils are equal and reactive. Face appears symmetric. Oral mucosa normal. Dentition normal.  BACK AND NECK: Nontender to light touch. Gait within normal limits. Full range of motion at all extremities. Normal strength and reflexes throughout and symmetric. Cranial nerves symmetric and normal.  LUNGS: Clear with no wheezes.  HEART: Regular rate and rhythm.  ABDOMEN: Soft, nontender, normal bowel sounds.  VITAL SIGNS: Currently shows a temperature of 97.8, pulse 88, respirations 18, blood pressure 128/78.   LABORATORY RESULTS: His labs on admission were largely normal, nothing really remarkable. Chemistry panel all normal. Alcohol level undetectable.  TSH very slightly low at 0.44, which probably is an artifact. He has a white blood cell count of 12.5 but otherwise a normal CBC. Drug screen is all negative. Urinalysis is unremarkable. Acetaminophen and salicylates are nontoxic.   ASSESSMENT: A 29 year old man who presents with a new subacute onset set of psychotic symptoms with agitation, religious hallucinations and delusions, bizarre behavior. There is no past psychiatric history, no history known  of substance abuse or medical problems and no evidence of any neurologic problem. Given the lack of any other explanation, the most likely conclusion is that this is a new onset psychotic disorder, either schizophreniform disorder or bipolar disorder, although it is still possible that this could be a reaction to some unknown substances. The patient requires hospitalization because of his psychosis and inability to function at home.   TREATMENT PLAN: The patient was started on Haldol, Cogentin and Paxil on admission. Right now the psychosis is so overwhelming I do not think we can judge a mood disorder, and I am discontinuing the Paxil as being unnecessary. Continue the Haldol and Cogentin. I have done some psychoeducation with the patient, gently telling him what my conclusions were and why he was on the medicine. I encouraged him to let staff know if he had any concerns at all. I encouraged him to try to get out of his room when possible and interact in groups. I have tried to get through to his mother by the telephone number in the chart, and so far the number has been unreachable for some reason.  I will keep trying to reach someone if I get other numbers. Hopefully, family will come in to visit him.   DIAGNOSIS, PRINCIPAL AND PRIMARY:  AXIS I: Schizophreniform disorder.   SECONDARY DIAGNOSES: AXIS I: No further diagnosis.   AXIS II: No diagnosis.   AXIS III: No diagnosis.   AXIS IV: Moderate from lack of work and restricted social situation.   AXIS V: Functioning at time of evaluation 29.   ____________________________ Audery AmelJohn T. Ryu Cerreta, MD jtc:cb D: 06/30/2012 17:34:41 ET T: 06/30/2012 17:45:51 ET JOB#: [id number]  cc: Audery AmelJohn T. Joya Willmott, MD, <Dictator> Audery AmelJOHN T Cynitha Berte MD ELECTRONICALLY SIGNED 07/01/2012 23:54

## 2014-06-15 NOTE — Consult Note (Signed)
PATIENT NAME:  Shane Allen, Shane Allen MR#:  [id number] DATE OF BIRTH:  Jul 27, 1985  DATE OF CONSULTATION:  06/30/2012  REFERRING PHYSICIAN:  Bayard Males, MD  CONSULTING PHYSICIAN:  Ardeen Fillers. Garnetta Buddy, MD  REASON FOR CONSULTATION:  Psychosis and bizarre behavior.   HISTORY OF PRESENT ILLNESS: The patient is a 29 year old male who was brought to the ED by his mother and brother. According to the initial history, the patient has not slept in the past 5 days and has been restless, pacing and praying in the living room telling his mother not to come near him.  The patient has been up since 4:00 a.m. telling the mother that he was possessed by a demon.  The patient has been responding to internal stimuli. He has been isolating himself in his room, paranoid, and was initially agitated and was unable to provide any history.   During my interview, the patient was calm and reported that he has past problems and he has to deal with them.  He reported that he is living his past on that. He appeared somewhat circumstantial and stated that he has things that needs to be forgotten.  He was listening to preacher and the pastor on the television. The patient  was noted to be holding a Bible in his hand. He reported that he feels strange on a daily basis. He stated that he needs forgiveness. He reported that he sleeps several hours during the daytime. Sometimes he wakes up early in the morning and sometimes he sleeps for several hours. He reported that he is unable to maintain any meaningful conversation. He appeared to be responding to internal stimuli, but he declined having any auditory or visual hallucinations. His family reported that the patient has been experiencing auditory and visual hallucinations and has been paranoid and obsessed with religious preoccupations and thoughts. He also believes that he is possessed by a demon and was trying to get relief of them.  His father was trying to find him a job, but he was unable  to work with him. The patient reported that he does not have any  motivation and he is unable to find any work at this time. He currently lives with his mother. He has never been admitted to a psychiatric inpatient or outpatient facility and has never taken any medications for any depression, anxiety or paranoia in the past. He does not have any thoughts to harm himself. However, the patient is unable to contract for safety as he appeared very disheveled, responding to internal stimuli, and appeared disorganized.   PAST PSYCHIATRIC HISTORY: The patient does not have any history of previous suicide attempts or inpatient hospitalizations. He has never taken any psychotropic medications. He remains a risk to himself at this time.   PAST MEDICAL HISTORY: The patient denied any history of medical problems at this time.   ALLERGIES: Tylenol #3.   CURRENT MEDICATIONS: None. The patient denied any medical problems or medical history at this time.   FAMILY HISTORY: The patient reported that he does not have any history of psychiatric illness in his family.   SOCIAL HISTORY: The patient has never been married, does not have any children. He currently lives with his family members who are very supportive.   REVIEW OF SYSTEMS:  CONSTITUTIONAL: Denies any fever, chills, weight loss or weight gain.  EYES: Denied any double or blurred vision.  ENT: Denied having any hair loss, discharge. RESPIRATORY: Denies any cough, wheezing or chest pain.  CARDIOVASCULAR: Denies  in any chest pain, orthopnea or dizziness.  GASTROINTESTINAL: No nausea, vomiting or diarrhea noted.  GENITOURINARY:  No dysuria or hematuria noted.  ENDOCRINE: No nocturia, thyroid problems or increased sweating noted.  NEUROLOGIC: No numbness, weakness are dysarthria noted.  VITAL SIGNS: Temperature 98.2, pulse 93, respirations 18, blood pressure 122/59.   LABORATORY DATA: Glucose 93, BUN 13, creatinine 0.99, sodium 139, potassium 3.7,  chloride 107, bicarbonate 126, anion gap 6, osmolality 277, and calcium 9.4. Blood alcohol less than 3. Protein 7.7, albumin 4.4, bilirubin 0.5, alkaline phosphatase 69, AST 33, ALT 21. Urine drug screen was negative.   MENTAL STATUS EXAMINATION: The patient is a disheveled-appearing male with poorly groomed hair with a ponytail.  He maintained fair eye contact. His speech was low in tone and volume. Mood was depressed. Affect was blunted.  Thought process was tangential. He appeared to be responding to internal stimuli. He was unable to contract for safety. He demonstrated poor insight and judgment.   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  AXIS II: Rule out schizoid personality disorder. AXIS III: None.  AXIS IV: Severe mental illness.  AXIS V: Current GAF 25.  TREATMENT PLAN: 1.  The patient is currently under involuntary commitment.  2.  He will be admitted to the inpatient behavioral health unit for stabilization and safety.  3.  I will start him on Haldol 5 mg p.o. b.i.d.  4.  I will also start him on Cogentin 0.5 mg p.o. b.i.d.  5.  I will start him on Paxil 10 mg p.o. b.i.d. for depressive symptoms.  6.  He will be given trazodone 100 mg p.o. at bedtime p.r.n. for insomnia. He will be monitored closely with by the treatment team group and will attend group and milieu therapy for stabilization.  Thank you for allowing me to participate in the care of this patient.  ____________________________ Ardeen FillersUzma S. Garnetta BuddyFaheem, MD usf:sb D: 06/30/2012 10:26:31 ET T: 06/30/2012 10:59:39 ET JOB#: [id number]  cc: Ardeen FillersUzma S. Garnetta BuddyFaheem, MD, <Dictator> Rhunette CroftUZMA S Kewanda Poland MD ELECTRONICALLY SIGNED 07/01/2012 9:06

## 2014-06-15 NOTE — Discharge Summary (Signed)
PATIENT NAME:  Shane Allen, Shane Allen MR#:  [id number] DATE OF BIRTH:  1985-08-15  DATE OF ADMISSION:  06/30/2012 DATE OF DISCHARGE:  07/04/2012  HOSPITAL COURSE: See dictated history and physical for details of admission. This 29 year old man was admitted to the hospital in a state of acute psychosis and agitation. He was grossly delusional and agitated in the Emergency Room and initially required forced medication and restraint in the Emergency Room. History from the family was that the patient had had some symptoms of emotional disengagement and withdrawal and possible disorganized thinking and paranoia that had been evident for years without ever erupting into obvious psychosis. The current episode was of fairly acute onset without a clear precipitant to it. In the hospital, the patient was initially bizarre in his behavior and withdrawn, but was cooperative with oral medication after the first day. Once he began taking medication, he very rapidly improved in his mental state. He became much more appropriately interactive, stopped acting in a bizarre manner, stopped voicing delusional and hyperreligious ideas and interacted appropriately with his peers. All in all, it was a very rapid improvement. At no time did the patient express any suicidal ideas or act in a violent manner in the hospital. Family visited on several occasions and participated in education about mental health issues. The patient did not appear to have a substance abuse problem. It was emphasized to the patient and his family that he needed to stay on medication and follow up with outpatient treatment as a long-term course was unpredictable, that there was a high risk of relapse of psychosis. He agreed to the treatment plan.   The patient did appear to have a dystonic reaction on his second hospital day, possibly related to haloperidol. Once medicine was changed to Navane with added Cogentin, he did not have a return of this symptom.    LABORATORY RESULTS: Admission labs show acetaminophen undetectable, chemistries entirely normal, alcohol undetected, CBC only with a slightly elevated white count at 12.5. Salicylates slightly elevated 3.4. TSH low at 0.4. Drug screen negative. Urinalysis positive for protein.  Followup urinalysis unremarkable.   DISCHARGE MEDICATIONS: Trazodone 100 mg at night as needed for sleep, Navane 2 mg 2 times a day, Cogentin 0.5 mg 2 times a day.   MENTAL STATUS EXAMINATION AT DISCHARGE: Casually dressed, reasonably well groomed young man, looks his stated age, cooperative with the interview. Good eye contact, normal psychomotor activity. Speech is still somewhat slow and decreased in amount, but affect is a little bit blunted. Mood is stated as good. Thoughts appear to be simple and slow, but lucid. No bizarre thinking. No evidence of delusions. No hyperreligious statements. Denies hallucinations. Denies suicidal or homicidal ideation. Shows improved insight and judgment and is agreeable to the outpatient treatment plan.   DISPOSITION: The patient is discharged home with his family and is referred to [facility] for outpatient treatment.   DIAGNOSES, PRINCIPAL AND PRIMARY:  AXIS I: Schizophreniform disorder.  AXIS II: Deferred.  AXIS III: No diagnosis.  AXIS IV: Moderate chronic stress from lack of resources, lack of a job, decreased educational attainment.  AXIS V: Functioning at time of discharge 54.   ____________________________ Audery AmelJohn T. Clapacs, MD jtc:dmm D: 07/18/2012 22:00:55 ET T: 07/18/2012 22:38:22 ET JOB#: [id number]  cc: Audery AmelJohn T. Clapacs, MD, <Dictator> Audery AmelJOHN T CLAPACS MD ELECTRONICALLY SIGNED 07/19/2012 9:26

## 2014-09-18 ENCOUNTER — Emergency Department (HOSPITAL_COMMUNITY)
Admission: EM | Admit: 2014-09-18 | Discharge: 2014-09-19 | Disposition: A | Discharge status: Other Acute Inpatient Hospital | Payer: Self-pay | Attending: Emergency Medicine | Admitting: Emergency Medicine

## 2014-09-18 ENCOUNTER — Encounter (HOSPITAL_COMMUNITY): Payer: Self-pay | Admitting: *Deleted

## 2014-09-18 DIAGNOSIS — F209 Schizophrenia, unspecified: Secondary | ICD-10-CM | POA: Insufficient documentation

## 2014-09-18 DIAGNOSIS — Z792 Long term (current) use of antibiotics: Secondary | ICD-10-CM | POA: Insufficient documentation

## 2014-09-18 DIAGNOSIS — Z720 Tobacco use: Secondary | ICD-10-CM | POA: Insufficient documentation

## 2014-09-18 DIAGNOSIS — Z79899 Other long term (current) drug therapy: Secondary | ICD-10-CM | POA: Insufficient documentation

## 2014-09-18 HISTORY — DX: Schizoaffective disorder, unspecified: F25.9

## 2014-09-18 HISTORY — DX: Schizoaffective disorder, bipolar type: F25.0

## 2014-09-18 LAB — COMPREHENSIVE METABOLIC PANEL
ALK PHOS: 66 U/L (ref 38–126)
ALT: 23 U/L (ref 17–63)
ANION GAP: 13 (ref 5–15)
AST: 25 U/L (ref 15–41)
Albumin: 4.6 g/dL (ref 3.5–5.0)
BUN: 10 mg/dL (ref 6–20)
CALCIUM: 9.6 mg/dL (ref 8.9–10.3)
CHLORIDE: 103 mmol/L (ref 101–111)
CO2: 22 mmol/L (ref 22–32)
Creatinine, Ser: 1.11 mg/dL (ref 0.61–1.24)
GFR calc Af Amer: >60 mL/min (ref >60)
GFR calc non Af Amer: >60 mL/min (ref >60)
Glucose, Bld: 136 mg/dL — ABNORMAL HIGH (ref 65–99)
POTASSIUM: 3.6 mmol/L (ref 3.5–5.1)
Sodium: 138 mmol/L (ref 135–145)
Total Bilirubin: 0.6 mg/dL (ref 0.3–1.2)
Total Protein: 7.1 g/dL (ref 6.5–8.1)

## 2014-09-18 LAB — CBC
HCT: 45.1 % (ref 39.0–52.0)
Hemoglobin: 15.5 g/dL (ref 13.0–17.0)
MCH: 32.4 pg (ref 26.0–34.0)
MCHC: 34.4 g/dL (ref 30.0–36.0)
MCV: 94.4 fL (ref 78.0–100.0)
PLATELETS: 300 10*3/uL (ref 150–400)
RBC: 4.78 MIL/uL (ref 4.22–5.81)
RDW: 13.3 % (ref 11.5–15.5)
WBC: 10.8 10*3/uL — ABNORMAL HIGH (ref 4.0–10.5)

## 2014-09-18 LAB — RAPID URINE DRUG SCREEN, HOSP PERFORMED
AMPHETAMINES: NOT DETECTED
BARBITURATES: NOT DETECTED
Benzodiazepines: NOT DETECTED
Cocaine: NOT DETECTED
Opiates: NOT DETECTED
Tetrahydrocannabinol: NOT DETECTED

## 2014-09-18 LAB — ETHANOL: Alcohol, Ethyl (B): <5 mg/dL (ref <5)

## 2014-09-18 MED ORDER — LORAZEPAM 1 MG PO TABS
1.0000 mg | ORAL_TABLET | Freq: Once | ORAL | Status: AC
  Administered 2014-09-18: 1 mg via ORAL
  Filled 2014-09-18: qty 1

## 2014-09-18 MED ORDER — LORAZEPAM 1 MG PO TABS
1.0000 mg | ORAL_TABLET | Freq: Three times a day (TID) | ORAL | Status: DC | PRN
  Administered 2014-09-18: 1 mg via ORAL
  Filled 2014-09-18 (×2): qty 1

## 2014-09-18 MED ORDER — NICOTINE 21 MG/24HR TD PT24
21.0000 mg | MEDICATED_PATCH | Freq: Every day | TRANSDERMAL | Status: DC
  Administered 2014-09-18 (×2): 21 mg via TRANSDERMAL
  Filled 2014-09-18 (×2): qty 1

## 2014-09-18 NOTE — ED Notes (Signed)
Pt. Brought back to POD C with mother. This RN explained to mother the visitation policy. Mother reluctant to leave. This RN handed mother the welcome to POD C sheet and reexplained the rules and mother was more willing to leave. Pt. Mother left with all pt. Belongings

## 2014-09-18 NOTE — ED Notes (Signed)
Pt updated and informed of acceptance into [facility], calm and cooperative and still agreeing to go voluntary.

## 2014-09-18 NOTE — ED Notes (Signed)
Pt currently resting and in nad. Sitter at bedside.
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
## 2014-09-18 NOTE — ED Notes (Addendum)
Shane Allen called to ask for update on pt behavior and status. Will call back with update on whether pt has been accepted or not.

## 2014-09-18 NOTE — ED Notes (Signed)
Pt. Placed in C22 to be in a camera room. Pt has tried to leave once while this RN was giving meds to another pt. Pt. Was caught by staff in POD B and redirected to POD C. Pt. Is in room at this time.

## 2014-09-18 NOTE — Progress Notes (Signed)
Patient has been referred to [facility], per Morrie Sheldon, fax referral for review.  Melbourne Abts, LCSWA Disposition staff 09/18/2014 8:51 PM

## 2014-09-18 NOTE — ED Notes (Signed)
Pt mother called and wishing to speak with pt, due to pt being transferred in the morning this RN allowed pt to speak with mother briefly and update her on transfer to [facility].
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
## 2014-09-18 NOTE — Progress Notes (Signed)
Seeking inpatient psych placement for pt.  Referred to [facility]- per [place]- no beds today but can be reviewed for waitlist [facility]- per [facility]- per [facility]- per Annabelle Harman  At capacity: [facility] [place], MSW, [employer] Clinical Social Work, Disposition  09/18/2014 [phone]

## 2014-09-18 NOTE — ED Notes (Signed)
Pt currently making hand gestures to the sky and continues to pace in the corner of the room. This RN went to introduce herself and pt is calm and cooperative, pt states "I feel like I have personality disorder, I just keep hearing people say mhmmm and yes and I can't stop it." Pt admits he has not been taking his medications but this RN unsure of exact cause, pt unable to provide clear answer. Sitter at bedside.

## 2014-09-18 NOTE — ED Notes (Signed)
Patient here with family member. Patient reluctant to speak in triage. Mother reports that patient has been acting unusual for a couple days. Reports history of psychotic behavior with poor management via medications. Patient noted to have ears plugged in triage and reacting to internal stimuli at moments. When asked about hearing voices patient states "I don't want to talk about it right now". Adamantly denies SI/HI.

## 2014-09-18 NOTE — ED Notes (Signed)
Pt on 1st phone call with brother, pt calm and talking normal. Pt asking if his mother is okay. Pt now resting comfortably.

## 2014-09-18 NOTE — ED Notes (Signed)
Pt currently laying in bed and calm. Sitter at bedside.

## 2014-09-18 NOTE — ED Notes (Signed)
Pt stating he is going to try and get some rest pt requesting "the medicine I  Had earlier that helped me not think about the little things. "

## 2014-09-18 NOTE — ED Notes (Signed)
RN watching pt. On camera. Pt. Seems to be having active visual and auditory hallucinations. Pt looks to be fighting and arguing with unseen persons. Pt. Not trying to harm himself or others at this time.

## 2014-09-18 NOTE — ED Notes (Signed)
Dinner ordered 

## 2014-09-18 NOTE — ED Notes (Addendum)
Pt requesting to have new nicotine patch applied, pt is pleasant at this time and interacting appropriately with the sitter and staff. Pt states "I feel pretty good and much better right now." pt apologetic about taking off previous applied nicotine patch administered by previous RN pt in nad.

## 2014-09-18 NOTE — ED Notes (Signed)
TTS being completed at this time. ?

## 2014-09-18 NOTE — ED Notes (Addendum)
Shane Allen from [facility] assessment called to report that pt has been accepted into [facility], and pt is able to be transported tomorrow after 7am to     [employer] report to: [phone]    Accepting MD: Dr. Robet Leu *Per Morrie Sheldon at [facility]

## 2014-09-18 NOTE — ED Notes (Signed)
Pt eating lunch, pt noted to be staring at the wall entire time pt is eating food. Pt calm.

## 2014-09-18 NOTE — BHH Counselor (Signed)
This Clinical research associate spoke with ED M.D. Cartner staffing Pt.'s case and agreed to possible IVC if Pt. Was not willing to self admit on a voluntary bases.

## 2014-09-18 NOTE — BHH Counselor (Signed)
BHH Assessment Progress Note  Counselor spoke with EDP Cartner concerning patient prior to assessment. He shared that pt had been diagnosed w/ schizophrenia 3 years ago and was following up with [facility] and was doing well. EDP continued that pt had a psychotic break @ 2 years ago and was given Haldol. Pt had a bad reaction to the medication and stopped taking meds after that as a result of the bad reaction he suffered. EDP shared that pt's mom indicated that he has been talking to ppl that are not there. EDP also noted that pt has "rapid jerks and twitches", that he is not violent, and that he does not have a s/a history.

## 2014-09-18 NOTE — BH Assessment (Signed)
Accepted to [facility] per Morrie Sheldon, to arrive after 7 am 09-19-14, to [facility] building, under the care of Dr. Robet Leu, report to be called to [phone].   Informed Danielle RN she will inform pt, and EDP.   Clista Bernhardt, [facility] Triage Specialist 09/18/2014 8:39 PM

## 2014-09-18 NOTE — ED Notes (Signed)
Pt. Sitting in recliner in room and still talking and fighting with things unseen. Pt then removed nicotine patch and threw it on the ground

## 2014-09-18 NOTE — ED Notes (Signed)
Pt attempting to go outside to smoke, pt advised that it is not allowed per policy, pt continues to be agitated, pt asking if rooms are empty, pt advised that his room is available & that other pts are in the other rooms, pt reluctant but is cooperative at this time

## 2014-09-18 NOTE — ED Provider Notes (Signed)
CSN: [id number]     Arrival date & time 09/18/14  4098 History   First MD Initiated Contact with Patient 09/18/14 0617     Chief Complaint  Patient presents with  . Medical Clearance     (Consider location/radiation/quality/duration/timing/severity/associated sxs/prior Treatment) HPI Shane Allen is a 29 y.o. male with a reported history of schizophrenia. Patient is accompanied by mother who contributes history of present illness. Mother states patient was diagnosed with schizophrenia 3 years ago, had a psychotic break down and was treated with Haldol at a hospital and patient had a bad reaction to medication and has been resistant to taking medications since that time. Reports patient has not taken medications for the past 2 years, but has been doing well overall. 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 Mom believes that this was a trigger to current psychosis tendencies. Mom states 3 days ago patient begins talking to himself and other people that are not there. She reports she is putting toilet paper in his ears to drown out voices. Mom and patient denied any suicidal or homicidal ideation. Patient believes he may be possessed with demons, but repeatedly states "I do not want to talk about it". Denies any other medical problems.  Past Medical History  Diagnosis Date  . Schizo affective schizophrenia    No past surgical history on file. No family history on file. History  Substance Use Topics  . Smoking status: Current Every Day Smoker  . Smokeless tobacco: Not on file  . Alcohol Use: No    Review of Systems A 10 point review of systems was completed and was negative except for pertinent positives and negatives as mentioned in the history of present illness     Allergies  Other and Tylenol with codeine #3  Home Medications   Prior to Admission medications   Medication Sig Start Date End  Date Taking? Authorizing Provider  ARIPiprazole (ABILIFY) 15 MG tablet Take 15 mg by mouth daily.    Historical Provider, MD  benztropine (COGENTIN) 1 MG tablet Take 1 mg by mouth daily.    Historical Provider, MD  bismuth subsalicylate (PEPTO BISMOL) 262 MG/15ML suspension Take 30 mLs by mouth every 6 (six) hours as needed for indigestion.    Historical Provider, MD  cephALEXin (KEFLEX) 500 MG capsule Take 2 capsules (1,000 mg total) by mouth 2 (two) times daily. 01/25/14   Fayrene Helper, PA-C  ibuprofen (ADVIL,MOTRIN) 200 MG tablet Take 200-400 mg by mouth daily as needed. For pain    Historical Provider, MD  indomethacin (INDOCIN) 25 MG capsule Take 2 capsules (50 mg total) by mouth 3 (three) times daily as needed. 02/10/14   Harle Battiest, NP  LORazepam (ATIVAN) 1 MG tablet Take 1 tablet (1 mg total) by mouth 2 (two) times daily. 12/28/12   Roxy Horseman, PA-C  lurasidone (LATUDA) 40 MG TABS tablet Take 40 mg by mouth daily with breakfast.    Historical Provider, MD  oxyCODONE-acetaminophen (PERCOCET/ROXICET) 5-325 MG per tablet Take 2 tablets by mouth every 4 (four) hours as needed for severe pain. 01/25/14   Fayrene Helper, PA-C  traMADol (ULTRAM) 50 MG tablet Take 1 tablet (50 mg total) by mouth every 6 (six) hours as needed. 02/11/14   Jennifer Piepenbrink, PA-C  traZODone (DESYREL) 100 MG tablet Take 100 mg by mouth at bedtime.    Historical Provider, MD  BP 133/88 mmHg  Pulse 96  Temp(Src) 97.8 F (36.6 C) (Oral)  Resp 16  SpO2 98% Physical Exam  Constitutional: He is oriented to person, place, and time. He appears well-developed and well-nourished.  HENT:  Head: Normocephalic and atraumatic.  Mouth/Throat: Oropharynx is clear and moist.  Eyes: Conjunctivae are normal. Pupils are equal, round, and reactive to light. Right eye exhibits no discharge. Left eye exhibits no discharge. No scleral icterus.  Neck: Neck supple.  Cardiovascular: Normal rate, regular rhythm and normal heart  sounds.   Pulmonary/Chest: Effort normal and breath sounds normal. No respiratory distress. He has no wheezes. He has no rales.  Abdominal: Soft. There is no tenderness.  Musculoskeletal: He exhibits no tenderness.  Neurological: He is alert and oriented to person, place, and time.  Cranial Nerves II-XII grossly intact  Skin: Skin is warm and dry. No rash noted.  Psychiatric: He has a normal mood and affect.  Patient is somewhat evasive with questioning. He does have rapid jerking ticks intermittently.  Nursing note and vitals reviewed.   ED Course  Procedures (including critical care time) Labs Review Labs Reviewed  CBC - Abnormal; Notable for the following:    WBC 10.8 (*)    All other components within normal limits  COMPREHENSIVE METABOLIC PANEL - Abnormal; Notable for the following:    Glucose, Bld 136 (*)    All other components within normal limits  URINE RAPID DRUG SCREEN, HOSP PERFORMED  ETHANOL    Imaging Review No results found.   EKG Interpretation None     Meds given in ED:  Medications  LORazepam (ATIVAN) tablet 1 mg (not administered)  nicotine (NICODERM CQ - dosed in mg/24 hours) patch 21 mg (21 mg Transdermal Patch Removed 09/18/14 1058)  LORazepam (ATIVAN) tablet 1 mg (1 mg Oral Given 09/18/14 0836)    New Prescriptions   No medications on file   Filed Vitals:   09/18/14 0452  BP: 133/88  Pulse: 96  Temp: 97.8 F (36.6 C)  TempSrc: Oral  Resp: 16  SpO2: 98%    MDM  Vitals stable - WNL -afebrile Pt resting comfortably in ED. PE--physical exam as above it is not concerning. Labwork-labs are noncontributory.  DDX--patient with previous history of schizophrenia, nonmedicated, recent psychotic activity. No suicidal or homicidal ideation. Patient is stable and medically cleared at this time. Pending TTS evaluation. Spoke with TTS, they recommended inpatient hospitalization and are actively seeking a bed. Also spoke to Onalee Hua who reports that  patient has experienced rapid deterioration over the past week secondary to medication noncompliance, has increased agitation and is concerned if he is released from the hospital that it can lead to harm to either the patient or other people. At this time the patient is noncompliant with staying, Behavioral Health recommends IVC. Psych hold orders placed, patient moved to holding area. I discussed all relevant lab findings and imaging results with pt and they verbalized understanding. Discussed f/u with PCP within 48 hrs and return precautions, pt very amenable to plan.  Final diagnoses:  Schizophrenia, unspecified type        Joycie Peek, PA-C 09/18/14 1444  Marisa Severin, MD 09/18/14 1724

## 2014-09-19 MED ORDER — LORAZEPAM 2 MG/ML IJ SOLN
1.0000 mg | Freq: Once | INTRAMUSCULAR | Status: AC
  Administered 2014-09-19: 1 mg via INTRAMUSCULAR
  Filled 2014-09-19: qty 1

## 2014-09-19 NOTE — ED Provider Notes (Signed)
Patient is alert and ambulates without difficulty Glasgow Coma Score score 15 pleasant and cooperative. Stable for transfer to [facility] [address] . Robet Leu is accepting physician. Results for orders placed or performed during the hospital encounter of 09/18/14  CBC  Result Value Ref Range   WBC 10.8 (H) 4.0 - 10.5 K/uL   RBC 4.78 4.22 - 5.81 MIL/uL   Hemoglobin 15.5 13.0 - 17.0 g/dL   HCT 16.1 09.6 - 04.5 %   MCV 94.4 78.0 - 100.0 fL   MCH 32.4 26.0 - 34.0 pg   MCHC 34.4 30.0 - 36.0 g/dL   RDW 40.9 81.1 - 91.4 %   Platelets 300 150 - 400 K/uL  Urine rapid drug screen (hosp performed) (Not at [facility])  Result Value Ref Range   Opiates NONE DETECTED NONE DETECTED   Cocaine NONE DETECTED NONE DETECTED   Benzodiazepines NONE DETECTED NONE DETECTED   Amphetamines NONE DETECTED NONE DETECTED   Tetrahydrocannabinol NONE DETECTED NONE DETECTED   Barbiturates NONE DETECTED NONE DETECTED  Ethanol (ETOH)  Result Value Ref Range   Alcohol, Ethyl (B) <5 <5 mg/dL  Comprehensive metabolic panel  Result Value Ref Range   Sodium 138 135 - 145 mmol/L   Potassium 3.6 3.5 - 5.1 mmol/L   Chloride 103 101 - 111 mmol/L   CO2 22 22 - 32 mmol/L   Glucose, Bld 136 (H) 65 - 99 mg/dL   BUN 10 6 - 20 mg/dL   Creatinine, Ser 7.82 0.61 - 1.24 mg/dL   Calcium 9.6 8.9 - 95.6 mg/dL   Total Protein 7.1 6.5 - 8.1 g/dL   Albumin 4.6 3.5 - 5.0 g/dL   AST 25 15 - 41 U/L   ALT 23 17 - 63 U/L   Alkaline Phosphatase 66 38 - 126 U/L   Total Bilirubin 0.6 0.3 - 1.2 mg/dL   GFR calc non Af Amer >60 >60 mL/min   GFR calc Af Amer >60 >60 mL/min   Anion gap 13 5 - 15   No results found.   Doug Sou, MD 09/19/14 [phone]

## 2014-10-26 ENCOUNTER — Emergency Department
Admission: EM | Admit: 2014-10-26 | Discharge: 2014-10-26 | Disposition: A | Discharge status: Other Acute Inpatient Hospital | Payer: Medicaid Other | Attending: Emergency Medicine | Admitting: Emergency Medicine

## 2014-10-26 ENCOUNTER — Inpatient Hospital Stay
Admission: EM | Admit: 2014-10-26 | Discharge: 2014-10-30 | DRG: 885 | Disposition: A | Discharge status: Home / Self Care | Payer: No Typology Code available for payment source | Source: Intra-hospital | Attending: Psychiatry | Admitting: Psychiatry

## 2014-10-26 ENCOUNTER — Encounter: Payer: Self-pay | Admitting: Behavioral Health

## 2014-10-26 DIAGNOSIS — F1020 Alcohol dependence, uncomplicated: Secondary | ICD-10-CM | POA: Diagnosis present

## 2014-10-26 DIAGNOSIS — G253 Myoclonus: Secondary | ICD-10-CM | POA: Diagnosis present

## 2014-10-26 DIAGNOSIS — F17210 Nicotine dependence, cigarettes, uncomplicated: Secondary | ICD-10-CM | POA: Diagnosis present

## 2014-10-26 DIAGNOSIS — G4700 Insomnia, unspecified: Secondary | ICD-10-CM | POA: Diagnosis present

## 2014-10-26 DIAGNOSIS — F209 Schizophrenia, unspecified: Secondary | ICD-10-CM | POA: Diagnosis present

## 2014-10-26 DIAGNOSIS — Z888 Allergy status to other drugs, medicaments and biological substances status: Secondary | ICD-10-CM

## 2014-10-26 DIAGNOSIS — F200 Paranoid schizophrenia: Secondary | ICD-10-CM | POA: Insufficient documentation

## 2014-10-26 DIAGNOSIS — H532 Diplopia: Secondary | ICD-10-CM | POA: Diagnosis present

## 2014-10-26 DIAGNOSIS — Z599 Problem related to housing and economic circumstances, unspecified: Secondary | ICD-10-CM | POA: Diagnosis not present

## 2014-10-26 DIAGNOSIS — F10939 Alcohol use, unspecified with withdrawal, unspecified: Secondary | ICD-10-CM

## 2014-10-26 DIAGNOSIS — R441 Visual hallucinations: Secondary | ICD-10-CM | POA: Diagnosis present

## 2014-10-26 DIAGNOSIS — F10239 Alcohol dependence with withdrawal, unspecified: Secondary | ICD-10-CM

## 2014-10-26 DIAGNOSIS — Z9114 Patient's other noncompliance with medication regimen: Secondary | ICD-10-CM | POA: Diagnosis present

## 2014-10-26 DIAGNOSIS — R440 Auditory hallucinations: Secondary | ICD-10-CM

## 2014-10-26 DIAGNOSIS — Z720 Tobacco use: Secondary | ICD-10-CM | POA: Insufficient documentation

## 2014-10-26 DIAGNOSIS — F17200 Nicotine dependence, unspecified, uncomplicated: Secondary | ICD-10-CM

## 2014-10-26 DIAGNOSIS — F203 Undifferentiated schizophrenia: Secondary | ICD-10-CM | POA: Diagnosis not present

## 2014-10-26 HISTORY — DX: Disorder of gingiva and edentulous alveolar ridge, unspecified: K06.9

## 2014-10-26 LAB — CBC
HCT: 48.8 % (ref 40.0–52.0)
HEMOGLOBIN: 16.4 g/dL (ref 13.0–18.0)
MCH: 31.4 pg (ref 26.0–34.0)
MCHC: 33.7 g/dL (ref 32.0–36.0)
MCV: 93.4 fL (ref 80.0–100.0)
Platelets: 300 10*3/uL (ref 150–440)
RBC: 5.23 MIL/uL (ref 4.40–5.90)
RDW: 13.2 % (ref 11.5–14.5)
WBC: 8.4 10*3/uL (ref 3.8–10.6)

## 2014-10-26 LAB — COMPREHENSIVE METABOLIC PANEL
ALBUMIN: 4.7 g/dL (ref 3.5–5.0)
ALT: 22 U/L (ref 17–63)
AST: 23 U/L (ref 15–41)
Alkaline Phosphatase: 79 U/L (ref 38–126)
Anion gap: 7 (ref 5–15)
BUN: 13 mg/dL (ref 6–20)
CHLORIDE: 104 mmol/L (ref 101–111)
CO2: 27 mmol/L (ref 22–32)
Calcium: 9.5 mg/dL (ref 8.9–10.3)
Creatinine, Ser: 0.99 mg/dL (ref 0.61–1.24)
GFR calc non Af Amer: >60 mL/min (ref >60)
Glucose, Bld: 101 mg/dL — ABNORMAL HIGH (ref 65–99)
Potassium: 3.9 mmol/L (ref 3.5–5.1)
Sodium: 138 mmol/L (ref 135–145)
Total Bilirubin: 0.7 mg/dL (ref 0.3–1.2)
Total Protein: 7.5 g/dL (ref 6.5–8.1)

## 2014-10-26 LAB — LIPID PANEL
Cholesterol: 184 mg/dL (ref 0–200)
HDL: 53 mg/dL (ref >40)
LDL CALC: 110 mg/dL — AB (ref 0–99)
TRIGLYCERIDES: 107 mg/dL (ref <150)
Total CHOL/HDL Ratio: 3.5 RATIO
VLDL: 21 mg/dL (ref 0–40)

## 2014-10-26 LAB — URINE DRUG SCREEN, QUALITATIVE (ARMC ONLY)
Amphetamines, Ur Screen: NOT DETECTED
Barbiturates, Ur Screen: NOT DETECTED
Benzodiazepine, Ur Scrn: NOT DETECTED
CANNABINOID 50 NG, UR ~~LOC~~: NOT DETECTED
COCAINE METABOLITE, UR ~~LOC~~: NOT DETECTED
MDMA (ECSTASY) UR SCREEN: NOT DETECTED
Methadone Scn, Ur: NOT DETECTED
OPIATE, UR SCREEN: NOT DETECTED
Phencyclidine (PCP) Ur S: NOT DETECTED
TRICYCLIC, UR SCREEN: NOT DETECTED

## 2014-10-26 LAB — SALICYLATE LEVEL

## 2014-10-26 LAB — ACETAMINOPHEN LEVEL: Acetaminophen (Tylenol), Serum: <10 ug/mL — ABNORMAL LOW (ref 10–30)

## 2014-10-26 LAB — ETHANOL: Alcohol, Ethyl (B): <5 mg/dL (ref <5)

## 2014-10-26 LAB — TSH: TSH: 0.911 u[IU]/mL (ref 0.350–4.500)

## 2014-10-26 MED ORDER — RISPERIDONE 1 MG PO TABS: 1.0000 mg | ORAL_TABLET | Freq: Two times a day (BID) | ORAL | Status: DC

## 2014-10-26 MED ORDER — PALIPERIDONE ER 3 MG PO TB24
3.0000 mg | ORAL_TABLET | Freq: Two times a day (BID) | ORAL | Status: DC
  Administered 2014-10-26: 3 mg via ORAL

## 2014-10-26 MED ORDER — NICOTINE 10 MG IN INHA
RESPIRATORY_TRACT | Status: AC
  Administered 2014-10-26: 1 via RESPIRATORY_TRACT
  Filled 2014-10-26: qty 36

## 2014-10-26 MED ORDER — NICOTINE 21 MG/24HR TD PT24
21.0000 mg | MEDICATED_PATCH | Freq: Every day | TRANSDERMAL | Status: DC
  Administered 2014-10-27 – 2014-10-28 (×2): 21 mg via TRANSDERMAL
  Filled 2014-10-26 (×2): qty 1

## 2014-10-26 MED ORDER — NICOTINE 10 MG IN INHA
1.0000 | RESPIRATORY_TRACT | Status: DC | PRN
  Administered 2014-10-26: 1 via RESPIRATORY_TRACT

## 2014-10-26 MED ORDER — MAGNESIUM HYDROXIDE 400 MG/5ML PO SUSP: 30.0000 mL | Freq: Every day | ORAL | Status: DC | PRN

## 2014-10-26 MED ORDER — INFLUENZA VAC SPLIT QUAD 0.5 ML IM SUSY
0.5000 mL | PREFILLED_SYRINGE | INTRAMUSCULAR | Status: AC
  Administered 2014-10-27: 0.5 mL via INTRAMUSCULAR
  Filled 2014-10-26: qty 0.5

## 2014-10-26 MED ORDER — ACETAMINOPHEN 325 MG PO TABS: 650.0000 mg | ORAL_TABLET | Freq: Four times a day (QID) | ORAL | Status: DC | PRN

## 2014-10-26 MED ORDER — PALIPERIDONE ER 3 MG PO TB24
3.0000 mg | ORAL_TABLET | Freq: Two times a day (BID) | ORAL | Status: DC
  Administered 2014-10-27: 3 mg via ORAL
  Filled 2014-10-26: qty 1

## 2014-10-26 MED ORDER — LORAZEPAM 2 MG PO TABS
2.0000 mg | ORAL_TABLET | Freq: Once | ORAL | Status: AC
  Administered 2014-10-26: 2 mg via ORAL
  Filled 2014-10-26: qty 1

## 2014-10-26 MED ORDER — PALIPERIDONE ER 3 MG PO TB24
ORAL_TABLET | ORAL | Status: AC
  Administered 2014-10-26: 3 mg via ORAL
  Filled 2014-10-26: qty 1

## 2014-10-26 MED ORDER — BENZTROPINE MESYLATE 1 MG PO TABS: 0.5000 mg | ORAL_TABLET | Freq: Two times a day (BID) | ORAL | Status: DC

## 2014-10-26 MED ORDER — PNEUMOCOCCAL VAC POLYVALENT 25 MCG/0.5ML IJ INJ
0.5000 mL | INJECTION | INTRAMUSCULAR | Status: AC
  Administered 2014-10-27: 0.5 mL via INTRAMUSCULAR
  Filled 2014-10-26: qty 0.5

## 2014-10-26 MED ORDER — ALUM & MAG HYDROXIDE-SIMETH 200-200-20 MG/5ML PO SUSP: 30.0000 mL | ORAL | Status: DC | PRN

## 2014-10-26 MED ORDER — BENZTROPINE MESYLATE 1 MG PO TABS
0.5000 mg | ORAL_TABLET | Freq: Two times a day (BID) | ORAL | Status: DC
  Administered 2014-10-26 – 2014-10-27 (×2): 0.5 mg via ORAL
  Filled 2014-10-26 (×2): qty 1
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
## 2014-10-26 NOTE — Consult Note (Signed)
[facility] Consult   Reason for Consult:  Consult for this 29 year old man with a history of schizophrenia who is brought to the hospital with complaints about "double vision" and hallucinations Referring Physician:  Jimmye Norman Patient Identification: Shane Allen MRN:  [id number] Principal Diagnosis: Schizophrenia Diagnosis:   Patient Active Problem List   Diagnosis Date Noted  . Schizophrenia [F20.9] 10/26/2014  . Noncompliance [Z91.19] 10/26/2014    Total Time spent with patient: 1 hour  Subjective:   Shane Allen is a 29 y.o. male patient admitted with "I just need to get my days and nights right and my medicine right".  HPI:  Information from patient and chart. Also information from the patient's mother. Patient came voluntarily to the emergency room today. His complaint to me is that he is having what he calls "double vision". When I ask him to describe that he says that it occurs when he looks away from an object that it will seem to move in his line of sight. He is not really describing what would typically be thought of as double vision. Additionally he talks about having "hallucinations". He uses the word several times but is nonspecific about what he means by it. He also complains of sleeping difficulty. He says his days and nights are wrong. I asked him if this meant was he sleeping during the daytime and up at night and he told me that in fact he was up at night and up during the daytime. Patient is not currently taking any psychiatric medicine. Sounds like he's been off of all psychiatric medicine for about a month since he's been home from his last hospitalization. He claims that he does drink several times a week. He's very vague about how much she is drinking. It so confusing it's not very reliable. Denies that he is using other drugs. Patient says he has stress from financial problems and from his symptoms of hallucinations.  Patient has a history of  psychotic symptoms. He was admitted to our hospital a couple years ago with an acute psychosis that resolved fairly quickly on medicine. According to the mother he has continued to have what sounds like a lot of negative symptoms of psychosis. He has had probably 2 other psychiatric hospitalizations. He says that he was at [facility] just last month. At first he told us that he didn't take any medicine while he was there but then he clarified that in fact he did take Abilify. Has not been taking medicine since he's been home. It doesn't appear that he has been seeing an outpatient provider at any point for psychiatric treatment. No clear history of substance abuse problems. No known history of suicide attempts or violence. He had a bad reaction to Haldol when he was hospitalized here with a lot of dystonia but tolerated Navane. He says that he's been treated with Abilify more recently.  Social history: Lives with his mother. Not working. Says he spends most of his time just laying around the house not doing much. Not married. No children.  Medical history: Patient has no significant ongoing medical problems that we know of for certain. Based on his medicine looks like he may have been treated for gastric reflux or for Adam Phenix a back to her at some point.  Substance abuse history: Patient's history about this is somewhat unreliable at like to get some collateral. He claimed to me that he drinks 4 times a week and drinks up to 12-18 beers. However,  the way that he described it was so odd and disorganized I'm not sure what to make of it. He denies that he is using any other drugs.  Family history: No family history of mental illness known. HPI Elements:   Quality:  Confusion hallucinations psychotic symptoms. Severity:  Moderate to severe and worsening. Very dysfunctional.. Timing:  Chronic it seems like although may be getting worse. Duration:  Ongoing symptoms of hallucinations. Context:  Medicine  noncompliance possible substance abuse.  Past Medical History:  Past Medical History  Diagnosis Date  . Schizo affective schizophrenia    History reviewed. No pertinent past surgical history. Family History: No family history on file. Social History:  History  Alcohol Use  . Yes     History  Drug Use No    Social History   Social History  . Marital Status: Single    Spouse Name: N/A  . Number of Children: N/A  . Years of Education: N/A   Social History Main Topics  . Smoking status: Current Every Day Smoker    Types: Cigarettes  . Smokeless tobacco: Never Used  . Alcohol Use: Yes  . Drug Use: No  . Sexual Activity: Yes   Other Topics Concern  . None   Social History Narrative   Additional Social History:                          Allergies:   Allergies  Allergen Reactions  . Haldol [Haloperidol Lactate] Other (See Comments)    Uncontrolled muscle movement  . Tylenol With Codeine #3 [Acetaminophen-Codeine] Palpitations    Labs:  Results for orders placed or performed during the hospital encounter of 10/26/14 (from the past 48 hour(s))  Comprehensive metabolic panel     Status: Abnormal   Collection Time: 10/26/14  1:46 PM  Result Value Ref Range   Sodium 138 135 - 145 mmol/L   Potassium 3.9 3.5 - 5.1 mmol/L   Chloride 104 101 - 111 mmol/L   CO2 27 22 - 32 mmol/L   Glucose, Bld 101 (H) 65 - 99 mg/dL   BUN 13 6 - 20 mg/dL   Creatinine, Ser 0.99 0.61 - 1.24 mg/dL   Calcium 9.5 8.9 - 10.3 mg/dL   Total Protein 7.5 6.5 - 8.1 g/dL   Albumin 4.7 3.5 - 5.0 g/dL   AST 23 15 - 41 U/L   ALT 22 17 - 63 U/L   Alkaline Phosphatase 79 38 - 126 U/L   Total Bilirubin 0.7 0.3 - 1.2 mg/dL   GFR calc non Af Amer >60 >60 mL/min   GFR calc Af Amer >60 >60 mL/min    Comment: (NOTE) The eGFR has been calculated using the CKD EPI equation. This calculation has not been validated in all clinical situations. eGFR's persistently <60 mL/min signify possible Chronic  Kidney Disease.    Anion gap 7 5 - 15  Ethanol (ETOH)     Status: None   Collection Time: 10/26/14  1:46 PM  Result Value Ref Range   Alcohol, Ethyl (B) <5 <5 mg/dL    Comment:        LOWEST DETECTABLE LIMIT FOR SERUM ALCOHOL IS 5 mg/dL FOR MEDICAL PURPOSES ONLY   Salicylate level     Status: None   Collection Time: 10/26/14  1:46 PM  Result Value Ref Range   Salicylate Lvl <7.4 2.8 - 30.0 mg/dL  Acetaminophen level     Status: Abnormal  Collection Time: 10/26/14  1:46 PM  Result Value Ref Range   Acetaminophen (Tylenol), Serum <10 (L) 10 - 30 ug/mL    Comment:        THERAPEUTIC CONCENTRATIONS VARY SIGNIFICANTLY. A RANGE OF 10-30 ug/mL MAY BE AN EFFECTIVE CONCENTRATION FOR MANY PATIENTS. HOWEVER, SOME ARE BEST TREATED AT CONCENTRATIONS OUTSIDE THIS RANGE. ACETAMINOPHEN CONCENTRATIONS >150 ug/mL AT 4 HOURS AFTER INGESTION AND >50 ug/mL AT 12 HOURS AFTER INGESTION ARE OFTEN ASSOCIATED WITH TOXIC REACTIONS.   CBC     Status: None   Collection Time: 10/26/14  1:46 PM  Result Value Ref Range   WBC 8.4 3.8 - 10.6 K/uL   RBC 5.23 4.40 - 5.90 MIL/uL   Hemoglobin 16.4 13.0 - 18.0 g/dL   HCT 48.8 40.0 - 52.0 %   MCV 93.4 80.0 - 100.0 fL   MCH 31.4 26.0 - 34.0 pg   MCHC 33.7 32.0 - 36.0 g/dL   RDW 13.2 11.5 - 14.5 %   Platelets 300 150 - 440 K/uL  Urine Drug Screen, Qualitative ([facility] only)     Status: None   Collection Time: 10/26/14  1:46 PM  Result Value Ref Range   Tricyclic, Ur Screen NONE DETECTED NONE DETECTED   Amphetamines, Ur Screen NONE DETECTED NONE DETECTED   MDMA (Ecstasy)Ur Screen NONE DETECTED NONE DETECTED   Cocaine Metabolite,Ur [place] NONE DETECTED NONE DETECTED   Opiate, Ur Screen NONE DETECTED NONE DETECTED   Phencyclidine (PCP) Ur S NONE DETECTED NONE DETECTED   Cannabinoid 50 Ng, Ur [place] NONE DETECTED NONE DETECTED   Barbiturates, Ur Screen NONE DETECTED NONE DETECTED   Benzodiazepine, Ur Scrn NONE DETECTED NONE DETECTED   Methadone Scn, Ur NONE  DETECTED NONE DETECTED    Comment: (NOTE) 397  Tricyclics, urine               Cutoff 1000 ng/mL 200  Amphetamines, urine             Cutoff 1000 ng/mL 300  MDMA (Ecstasy), urine           Cutoff 500 ng/mL 400  Cocaine Metabolite, urine       Cutoff 300 ng/mL 500  Opiate, urine                   Cutoff 300 ng/mL 600  Phencyclidine (PCP), urine      Cutoff 25 ng/mL 700  Cannabinoid, urine              Cutoff 50 ng/mL 800  Barbiturates, urine             Cutoff 200 ng/mL 900  Benzodiazepine, urine           Cutoff 200 ng/mL 1000 Methadone, urine                Cutoff 300 ng/mL 1100 1200 The urine drug screen provides only a preliminary, unconfirmed 1300 analytical test result and should not be used for non-medical 1400 purposes. Clinical consideration and professional judgment should 1500 be applied to any positive drug screen result due to possible 1600 interfering substances. A more specific alternate chemical method 1700 must be used in order to obtain a confirmed analytical result.  1800 Gas chromato graphy / mass spectrometry (GC/MS) is the preferred 1900 confirmatory method.     Vitals: Blood pressure 154/84, pulse 80, temperature 98 F (36.7 C), temperature source Oral, resp. rate 18, height $RemoveBe'5\' 10"'tsHpdZcij$  (1.778 m), weight 97.07 kg (214 lb), SpO2 97 %.  Risk to Self: Is patient at risk for suicide?: No Risk to Others:   Prior Inpatient Therapy:   Prior Outpatient Therapy:    Current Facility-Administered Medications  Medication Dose Route Frequency Provider Last Rate Last Dose  . benztropine (COGENTIN) tablet 0.5 mg  0.5 mg Oral BID Gonzella Lex, MD      . risperiDONE (RISPERDAL) tablet 1 mg  1 mg Oral BID Gonzella Lex, MD       Current Outpatient Prescriptions  Medication Sig Dispense Refill  . ARIPiprazole (ABILIFY) 15 MG tablet Take 15 mg by mouth daily.    . benztropine (COGENTIN) 1 MG tablet Take 1 mg by mouth daily.    [name] bismuth subsalicylate (PEPTO BISMOL) 262  MG/15ML suspension Take 30 mLs by mouth every 6 (six) hours as needed for indigestion.    . cephALEXin (KEFLEX) 500 MG capsule Take 2 capsules (1,000 mg total) by mouth 2 (two) times daily. 40 capsule 0  . ibuprofen (ADVIL,MOTRIN) 200 MG tablet Take 200-400 mg by mouth daily as needed. For pain    . indomethacin (INDOCIN) 25 MG capsule Take 2 capsules (50 mg total) by mouth 3 (three) times daily as needed. 30 capsule 0  . LORazepam (ATIVAN) 1 MG tablet Take 1 tablet (1 mg total) by mouth 2 (two) times daily. 10 tablet 0  . lurasidone (LATUDA) 40 MG TABS tablet Take 40 mg by mouth daily with breakfast.    . oxyCODONE-acetaminophen (PERCOCET/ROXICET) 5-325 MG per tablet Take 2 tablets by mouth every 4 (four) hours as needed for severe pain. 15 tablet 0  . traMADol (ULTRAM) 50 MG tablet Take 1 tablet (50 mg total) by mouth every 6 (six) hours as needed. 15 tablet 0  . traZODone (DESYREL) 100 MG tablet Take 100 mg by mouth at bedtime.      Musculoskeletal: Strength & Muscle Tone: within normal limits Gait & Station: normal Patient leans: N/A  Psychiatric Specialty Exam: Physical Exam  Nursing note and vitals reviewed. Constitutional: He appears well-developed and well-nourished.  HENT:  Head: Normocephalic and atraumatic.  Eyes: Conjunctivae are normal. Pupils are equal, round, and reactive to light.  Neck: Normal range of motion.  Cardiovascular: Normal heart sounds.   Respiratory: Effort normal.  GI: Soft.  Musculoskeletal: Normal range of motion.  Neurological: He is alert.  Skin: Skin is warm and dry.  Psychiatric: His affect is blunt. His speech is delayed. He is slowed. Cognition and memory are impaired. He expresses inappropriate judgment. He exhibits abnormal remote memory.  Patient presents as a somewhat withdrawn and blunt gentleman. Impoverished speech. Thought blocking. Not expressing obvious delusions but does have odd physical complaints and appears to be very confused.     Review of Systems  Constitutional: Negative.   HENT: Negative.   Eyes: Positive for double vision.  Respiratory: Negative.   Cardiovascular: Negative.   Gastrointestinal: Negative.   Musculoskeletal: Negative.   Skin: Negative.   Neurological: Negative.   Psychiatric/Behavioral: Positive for hallucinations and memory loss. Negative for depression, suicidal ideas and substance abuse. The patient is nervous/anxious and has insomnia.     Blood pressure 154/84, pulse 80, temperature 98 F (36.7 C), temperature source Oral, resp. rate 18, height $RemoveBe'5\' 10"'mIPDTTFOE$  (1.778 m), weight 97.07 kg (214 lb), SpO2 97 %.Body mass index is 30.71 kg/(m^2).  General Appearance: Casual  Eye Contact::  Good  Speech:  Slow  Volume:  Decreased  Mood:  Anxious  Affect:  Flat  Thought Process:  Disorganized  Orientation:  Full (Time, Place, and Person)  Thought Content:  Hallucinations: Visual  Suicidal Thoughts:  No  Homicidal Thoughts:  No  Memory:  Immediate;   Fair Recent;   Fair Remote;   Poor  Judgement:  Impaired  Insight:  Lacking  Psychomotor Activity:  Normal  Concentration:  Fair  Recall:  Poor  Fund of Knowledge:Fair  Language: Fair  Akathisia:  No  Handed:  Right  AIMS (if indicated):     Assets:  Desire for Improvement Housing Physical Health Social Support  ADL's:  Intact  Cognition: Impaired,  Mild  Sleep:      Medical Decision Making: New problem, with additional work up planned, Review of Psycho-Social Stressors (1), Review or order clinical lab tests (1), Review and summation of old records (2), Established Problem, Worsening (2), Review or order medicine tests (1) and Review of New Medication or Change in Dosage (2)  Treatment Plan Summary: Daily contact with patient to assess and evaluate symptoms and progress in treatment, Medication management and Plan This is a 29 year old man with a history of what I think is almost certainly schizophrenia. He has been chronically noncompliant  and probably undertreated for his symptoms. Currently he is disorganized in his thinking and having hallucinations. He is not making any threatening or violent statements. Nevertheless the degree of psychotic symptoms I think makes inpatient hospitalization the correct procedure. His history of noncompliance means he is unlikely to follow-up if simply directed to outpatient treatment. All of this was explained to the patient and he agreed to voluntarily come into the hospital. I'm going to start him on in [place] with the idea that we may be able to at some point get him towards a long-acting injectable medication. Cogentin when necessary. Educational counseling. Reviewed all labs. Nothing else that needs immediate attention.  Plan:  Recommend psychiatric Inpatient admission when medically cleared. Supportive therapy provided about ongoing stressors. Discussed crisis plan, support from social network, calling 911, coming to the Emergency Department, and calling Suicide Hotline. Disposition: Admit to psychiatry voluntarily  Alethia Berthold 10/26/2014 4:24 PM

## 2014-10-26 NOTE — ED Notes (Signed)
BEHAVIORAL HEALTH ROUNDING  Patient sleeping: No.  Patient alert and oriented: Yes. Behavior appropriate: Yes. ; If no, describe:  Nutrition and fluids offered: No  Toileting and hygiene offered: Yes  Sitter present: no  Law enforcement present: Yes   ENVIRONMENTAL ASSESSMENT  Potentially harmful objects out of patient reach: Yes.  Personal belongings secured: Yes.  Patient dressed in hospital provided attire only: Yes.  Plastic bags out of patient reach: Yes.  Patient care equipment (cords, cables, call bells, lines, and drains) shortened, removed, or accounted for: Yes.  Equipment and supplies removed from bottom of stretcher: Yes.  Potentially toxic materials out of patient reach: Yes.  Sharps container removed or out of patient reach: Yes.

## 2014-10-26 NOTE — ED Notes (Signed)
Intake Shane Allen at bedside 

## 2014-10-26 NOTE — Tx Team (Signed)
Initial Interdisciplinary Treatment Plan   PATIENT STRESSORS: Financial difficulties Medication change or noncompliance Occupational concerns Substance abuse   PATIENT STRENGTHS: Ability for insight Average or above average intelligence Motivation for treatment/growth Physical Health Supportive family/friends   PROBLEM LIST: Problem List/Patient Goals Date to be addressed Date deferred Reason deferred Estimated date of resolution  Mental Illness/Halluciantions 10/26/2014   10/31/2014  Substance Abuse 10/26/2014   10/31/2014  Financial Stressors 10/26/2014   10/31/2014  Medication Non-Compliance 10/26/2014   10/31/2014                                 DISCHARGE CRITERIA:  Improved stabilization in mood, thinking, and/or behavior Verbal commitment to aftercare and medication compliance Withdrawal symptoms are absent or subacute and managed without 24-hour nursing intervention  PRELIMINARY DISCHARGE PLAN: Outpatient therapy Return to previous living arrangement  PATIENT/FAMIILY INVOLVEMENT: This treatment plan has been presented to and reviewed with the patient, Shane Allen, and/or family member.  The patient and family have been given the opportunity to ask questions and make suggestions.  [name] Shari Prows 10/26/2014, 11:16 PM

## 2014-10-26 NOTE — ED Notes (Signed)

## 2014-10-26 NOTE — Progress Notes (Signed)
This is a 29 year old male admitted voluntarily for visual hallucinations and to receive assistance in resuming his medication regimin. Pt was recently discharged from [facility] and states that he could not afford his medications. Pt money went to cigarettes, gas, ETOH and other living expenses. Financial stressors are the primary reason for medication non-compliance, according to the patient. Pt reports an increase in visual hallucinations as well as insomna, and uses ETOH to help him sleep at times. Pt states that his vision is "slow," sees trails behind moving objects and has "double vision." Pt mood is sullen and his affect is flat, but he is pleasant and cooperative with staff. Pt denies SI on admission and wants assistance with getting his medications after discharge. Pt skin is unremarkable and no paraphernalia was found in his belongings. Writer oriented pt to the milieu and 15 minute checks iniiated for safety.

## 2014-10-26 NOTE — ED Notes (Signed)
Pt to [facility] from [facility].. Introduced self, explained policies. Pt verbalized understanding. States that he feels ok at this time.

## 2014-10-26 NOTE — BHH Counselor (Signed)
[employer] Enrollment completed and submitted by [employer] supervisor Theodoro Grist [phone]).  Authorization Request for Psych Inpt. Treatment from [employer] completed and submitted. TAR# [id number].

## 2014-10-26 NOTE — ED Notes (Signed)
Pt belongings given to the mother Shirlean Mylar [phone]

## 2014-10-26 NOTE — [facility] Counselor (Signed)
Pt. is to be admitted to [facility] by Dr. Toni Amend. Attending Physician will be Dr. Jennet Maduro.  Pt. has been assigned to room 305-A, by [facility] Charge Nurse Edwena Bunde.  Intake Paper Work has been signed and placed on pt. chart. ER staff [facility] ER Sect.; Dr. Mayford Knife, ER MD; Annette Stable Patient's Nurse & Renee Patient Access) have been made aware of the admission.

## 2014-10-26 NOTE — ED Notes (Signed)
MD at bedside., Dr.Williams  

## 2014-10-26 NOTE — ED Notes (Signed)
Patient assigned to appropriate care area. Patient oriented to unit/care area: Informed that, for their safety, care areas are designed for safety and monitored by security cameras at all times; and visiting hours explained to patient. Patient verbalizes understanding, and verbal contract for safety obtained. 

## 2014-10-26 NOTE — ED Notes (Signed)
Pt here with mother, pt states he is having visual hallucinations. Denies SI/HI.the patient has some jerking movements in triage but will not explain.

## 2014-10-26 NOTE — ED Notes (Signed)
BEHAVIORAL HEALTH ROUNDING Patient sleeping: No. Patient alert and oriented: yes Behavior appropriate: Yes.  ; If no, describe:  Nutrition and fluids offered: No Toileting and hygiene offered: Yes  Sitter present: no Law enforcement present: Yes  

## 2014-10-26 NOTE — ED Provider Notes (Signed)
[facility] Emergency Department Provider Note     Time seen: ----------------------------------------- 2:29 PM on 10/26/2014 -----------------------------------------    I have reviewed the triage vital signs and the nursing notes.   HISTORY  Chief Complaint Psychiatric Evaluation    HPI Shane Allen is a 29 y.o. male who presents ER with auditory and visual hallucinations. Patient denies suicidal or homicidal ideations. [facility]. Mom states when he left [facility] he was worse often when he entered. He has been admitted here before for paranoid schizophrenia, they're not notify medication that can help him. Patient is still hallucinating, otherwise denies complaints.   Past Medical History  Diagnosis Date  . Schizo affective schizophrenia     There are no active problems to display for this patient.   History reviewed. No pertinent past surgical history.  Allergies Haldol and Tylenol with codeine #3  Social History Social History  Substance Use Topics  . Smoking status: Current Every Day Smoker    Types: Cigarettes  . Smokeless tobacco: Never Used  . Alcohol Use: Yes    Review of Systems Constitutional: Negative for fever. Eyes: Negative for visual changes. ENT: Negative for sore throat. Cardiovascular: Negative for chest pain. Respiratory: Negative for shortness of breath. Gastrointestinal: Negative for abdominal pain, vomiting and diarrhea. Genitourinary: Negative for dysuria. Musculoskeletal: Negative for back pain. Skin: Negative for rash. Neurological: Negative for headaches, focal weakness or numbness. Psychiatric: Patient admits to auditory hallucinations at this time.  10-point ROS otherwise negative.  ____________________________________________   PHYSICAL EXAM:  VITAL SIGNS: ED Triage Vitals  Enc Vitals Group     BP 10/26/14 1341 154/84 mmHg   Pulse Rate 10/26/14 1341 80     Resp 10/26/14 1341 18     Temp 10/26/14 1341 98 F (36.7 C)     Temp Source 10/26/14 1341 Oral     SpO2 10/26/14 1341 97 %     Weight 10/26/14 1341 214 lb (97.07 kg)     Height 10/26/14 1341  (1.778 m)     Head Cir --      Peak Flow --      Pain Score --      Pain Loc --      Pain Edu? --      Excl. in GC? --     Constitutional: Alert and oriented. Well appearing and in no distress. Eyes: Conjunctivae are normal. PERRL. Normal extraocular movements. ENT   Head: Normocephalic and atraumatic.   Nose: No congestion/rhinnorhea.   Mouth/Throat: Mucous membranes are moist.   Neck: No stridor. Cardiovascular: Normal rate, regular rhythm. Normal and symmetric distal pulses are present in all extremities. No murmurs, rubs, or gallops. Respiratory: Normal respiratory effort without tachypnea nor retractions. Breath sounds are clear and equal bilaterally. No wheezes/rales/rhonchi. Gastrointestinal: Soft and nontender. No distention. No abdominal bruits.  Musculoskeletal: Nontender with normal range of motion in all extremities. No joint effusions.  No lower extremity tenderness nor edema. Neurologic:  Normal speech and language. No gross focal neurologic deficits are appreciated. Speech is normal. No gait instability. Skin:  Skin is warm, dry and intact. No rash noted. Psychiatric: Mood and affect are normal. Speech and behavior are normal. Patient exhibits appropriate insight and judgment. ____________________________________________  ED COURSE:  Pertinent labs & imaging results that were available during my care of the patient were reviewed by me and considered in my medical decision making (see chart  for details). Patient is in no acute distress, does appear to be responding to internal stimuli. ____________________________________________    LABS (pertinent positives/negatives)  Labs Reviewed  CBC  URINE DRUG SCREEN, QUALITATIVE  ([facility] ONLY)  COMPREHENSIVE METABOLIC PANEL  ETHANOL  SALICYLATE LEVEL  ACETAMINOPHEN LEVEL    ____________________________________________  FINAL ASSESSMENT AND PLAN  Paranoid schizophrenia  Plan: Patient with labs and imaging as dictated above. Patient with poorly functioning and possibly poorly managed schizophrenia. We'll benefit from psychiatric evaluation and possible medication adjustment. Mom states she is not taking medications because he said several adverse side effects including dystonic reactions.   Emily Filbert, MD   Emily Filbert, MD 10/26/14 [phone]

## 2014-10-26 NOTE — ED Notes (Signed)
Patient denies pain and is resting comfortably.  

## 2014-10-26 NOTE — ED Notes (Signed)
BEHAVIORAL HEALTH ROUNDING Patient sleeping: No. Patient alert and oriented: yes Behavior appropriate: Yes.  ; If no, describe:  Nutrition and fluids offered: Yes  Toileting and hygiene offered: Yes  Sitter present: no Law enforcement present: Yes, [employer] security officer 

## 2014-10-27 DIAGNOSIS — F10939 Alcohol use, unspecified with withdrawal, unspecified: Secondary | ICD-10-CM

## 2014-10-27 DIAGNOSIS — F17200 Nicotine dependence, unspecified, uncomplicated: Secondary | ICD-10-CM

## 2014-10-27 DIAGNOSIS — F10239 Alcohol dependence with withdrawal, unspecified: Secondary | ICD-10-CM

## 2014-10-27 DIAGNOSIS — F209 Schizophrenia, unspecified: Principal | ICD-10-CM

## 2014-10-27 DIAGNOSIS — F1020 Alcohol dependence, uncomplicated: Secondary | ICD-10-CM

## 2014-10-27 LAB — HEMOGLOBIN A1C: Hgb A1c MFr Bld: 5.2 % (ref 4.0–6.0)

## 2014-10-27 MED ORDER — AMANTADINE HCL 100 MG PO CAPS
100.0000 mg | ORAL_CAPSULE | Freq: Two times a day (BID) | ORAL | Status: DC
  Administered 2014-10-27 – 2014-10-30 (×7): 100 mg via ORAL
  Filled 2014-10-27 (×7): qty 1

## 2014-10-27 MED ORDER — LORAZEPAM 2 MG PO TABS
2.0000 mg | ORAL_TABLET | Freq: Every day | ORAL | Status: DC
  Administered 2014-10-27 – 2014-10-29 (×3): 2 mg via ORAL
  Filled 2014-10-27 (×3): qty 1

## 2014-10-27 MED ORDER — LORAZEPAM 2 MG PO TABS: 2.0000 mg | ORAL_TABLET | Freq: Three times a day (TID) | ORAL | Status: DC | PRN

## 2014-10-27 MED ORDER — PALIPERIDONE ER 3 MG PO TB24
6.0000 mg | ORAL_TABLET | Freq: Every day | ORAL | Status: DC
  Administered 2014-10-27 – 2014-10-29 (×3): 6 mg via ORAL
  Filled 2014-10-27 (×3): qty 2

## 2014-10-27 MED ORDER — LORAZEPAM 1 MG PO TABS
1.0000 mg | ORAL_TABLET | Freq: Every day | ORAL | Status: DC
  Administered 2014-10-27 – 2014-10-29 (×3): 1 mg via ORAL
  Filled 2014-10-27 (×3): qty 1

## 2014-10-27 NOTE — [facility] Group Notes (Signed)
[facility] LCSW Group Therapy  10/27/2014 3:09 PM  Type of Therapy:  Group Therapy  Participation Level:  Did Not Attend  Modes of Intervention:  Discussion, Education, Socialization and Support  Summary of Progress/Problems:Pt will identify unhealthy thoughts and how they impact their emotions and behavior. Pt will be encouraged to discuss these thoughts, emotions and behaviors with the group.   Turhan Chill L Cristal Howatt MSW, LCSWA  10/27/2014, 3:09 PM 

## 2014-10-27 NOTE — Plan of Care (Signed)
Problem: Ineffective individual coping Goal: STG: Patient will remain free from self harm Outcome: Progressing No self harm reported or observed     

## 2014-10-27 NOTE — [facility] Group Notes (Signed)
[facility] Group Notes:  (Nursing/MHT/Case Management/Adjunct)  Date:  10/27/2014  Time:  8:47 AM  Type of Therapy:  Group Therapy  Participation Level:  Did Not Attend  Summary of Progress/Problems:  Danea Manter De'Chelle Zanna Hawn 10/27/2014, 8:47 AM

## 2014-10-27 NOTE — H&P (Addendum)
Psychiatric Admission Assessment Adult  Patient Identification: Shane Allen MRN:  [id number] Date of Evaluation:  10/27/2014 Chief Complaint:  schizophrenia Principal Diagnosis: Schizophrenia, unspecified type Diagnosis:   Patient Active Problem List   Diagnosis Date Noted  . Schizophrenia, unspecified type [F20.9] 10/27/2014  . Tobacco use disorder [Z72.0] 10/27/2014  . Alcohol use disorder, moderate, dependence [F10.20] 10/27/2014   History of Present Illness: Shane Allen is a 29 y.o. male with h/o schizophrenia who presented to our ER with auditory and visual hallucinations.  [facility]. Mom states when he left [facility] he was worse often when he entered.  Patient is still hallucinating  Per ER psychiatrist: Patient came voluntarily to the emergency room on 9/2. His complaint to me is that he is having what he calls "double vision". When I ask him to describe that he says that it occurs when he looks away from an object that it will seem to move in his line of sight. He is not really describing what would typically be thought of as double vision. Additionally he talks about having "hallucinations". He uses the word several times but is nonspecific about what he means by it. He also complains of sleeping difficulty. He says his days and nights are wrong. I asked him if this meant was he sleeping during the daytime and up at night and he told me that in fact he was up at night and up during the daytime. Patient is not currently taking any psychiatric medicine. Sounds like he's been off of all psychiatric medicine for about a month since he's been home from his last hospitalization. He claims that he does drink several times a week. He's very vague about how much she is drinking. It so confusing it's not very reliable. Denies that he is using other drugs. Patient says he has stress from financial problems and from his symptoms of  hallucinations.  Today the patient was interviewed; he was transferred last night from the emergency department to our unit.  Patient appears an unreliable historian. There is thought blocking, some of his answers were not related to the questions. The patient was easily distractible and maintened poor eye contact. Patient is states he has not been taking his medications for about 3 months. He reports being prescribed with Abilify and is states he did not have the money to buy it. He is states he has been seen at [facility] in [place] where he is seeing for double vision.He denies SI, HI or having auditory or visual hallucinations.  Denies problems with appetite, energy and mood or concentration. States that the night prior to arrival to the emergency department he did not sleep at all.  He stated that sometimes when he doesn't sleep that he will develop hallucinations. Patient says he was admitted at [name] a few months ago.  Patient made the statements about the content of caffeine in his Abilify.  I suspect the patient developed akathisia from Abilify for what I can understand.  Substance abuse history: Patient's states he drinks about 6 beers a day but claims that he does not drink every day. He denies past history of withdrawals, blackouts or alcohol withdrawal seizures. Looks like when he was initially evaluated in the emergency department he had reported drinking up to 18 beers a day. The patient states he used to smoke marijuana but has not smoked marijuana in 2 years. He currently smokes cigarettes about 1-1/2  pack a day.   HPI Elements: Quality: Confusion hallucinations psychotic symptoms. Severity: Moderate to severe and worsening. Very dysfunctional.. Timing: Chronic it seems like although may be getting worse. Duration: Ongoing symptoms of hallucinations. Context: Medicine noncompliance possible substance abuse.   Total Time spent with patient: 1 hour   Past psychiatric  history:   He was admitted to our hospital in 2014 with an acute psychosis that resolved fairly quickly on medicine (diagnosed with schizophreniform disorder). According to the mother he has continued to have what sounds like a lot of negative symptoms of psychosis. He has had probably 2 other psychiatric hospitalizations. He says that he was at [facility] just last month. At first he told us that he didn't take any medicine while he was there but then he clarified that in fact he did take Abilify. Has not been taking medicine since he's been home. It doesn't appear that he has been seeing an outpatient provider at any point for psychiatric treatment. No clear history of substance abuse problems. No known history of suicide attempts or violence. He had a bad reaction to Haldol when he was hospitalized here with a lot of dystonia but tolerated Navane. He says that he's been treated with Abilify more recently.  Past Medical History: Patient has no significant ongoing medical problems that we know of for certain. Based on his medicine looks like he may have been treated for gastric reflux. Past Medical History  Diagnosis Date  . Schizo affective schizophrenia   . 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   History reviewed. No pertinent past surgical history.  Family History: History reviewed. No pertinent family history. patient denies having any relatives with mental illness, substance abuse, alcoholism or suicides in his family.  Social History: Lives with his mother in [place]. Not working. Says he spends most of his time just laying around the house not doing much. Not married. No children. Denies having medical insurance.  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  Patient has a ninth-grade education he dropped out of school due to having to find a job to help his  mother financially. The patient has 3 brothers and 2 sisters and reports having a good relationship with them.  As far as his legal history he denies ever having any legal trouble. History  Alcohol Use  . Yes    Comment: 4 to 5 beers a day every other week for sleep     History  Drug Use No    Social History   Social History  . Marital Status: Single    Spouse Name: N/A  . Number of Children: N/A  . Years of Education: N/A   Social History Main Topics  . Smoking status: Current Every Day Smoker -- 1.50 packs/day for 10 years    Types: Cigarettes  . Smokeless tobacco: Never Used  . Alcohol Use: Yes     Comment: 4 to 5 beers a day every other week for sleep  . Drug Use: No  . Sexual Activity: Yes    Birth Control/ Protection: None   Other Topics Concern  . None   Social History Narrative     Psychiatric Specialty Exam: Physical Exam  Review of Systems  Constitutional: Negative.   HENT: Negative.   Eyes: Negative.   Respiratory: Negative.   Cardiovascular: Negative.  Gastrointestinal: Negative.   Genitourinary: Negative.   Musculoskeletal: Negative.   Skin: Negative.   Neurological: Negative.   Endo/Heme/Allergies: Negative.   Psychiatric/Behavioral: Negative.     Blood pressure 117/77, pulse 70, temperature 98 F (36.7 C), temperature source Oral, resp. rate 18, height 5\' 10"  (1.778 m), weight 97.07 kg (214 lb), SpO2 100 %.Body mass index is 30.71 kg/(m^2).  General Appearance: Disheveled  Eye Contact::  Poor  Speech:  Slow  Volume:  Decreased  Mood:  Dysphoric  Affect:  Flat  Thought Process:  Loose  Orientation:  Full (Time, Place, and Person)  Thought Content:  Hallucinations: None  Suicidal Thoughts:  No  Homicidal Thoughts:  No  Memory:  Immediate;   Fair Recent;   Fair Remote;   Fair  Judgement:  Impaired  Insight:  Lacking  Psychomotor Activity:  Decreased  Concentration:  Poor  Recall:  NA  Fund of Knowledge:Fair  Language: Fair   Akathisia:  No  Handed:    AIMS (if indicated):     Assets:  Housing Physical Health Social Support  ADL's:  Intact  Cognition: WNL  Sleep:  Number of Hours: 6.25   Physical examination per ER: Constitutional: Alert and oriented. Well appearing and in no distress. Eyes: Conjunctivae are normal. PERRL. Normal extraocular movements. ENT   Head: Normocephalic and atraumatic.   Nose: No congestion/rhinnorhea.   Mouth/Throat: Mucous membranes are moist.   Neck: No stridor. Cardiovascular: Normal rate, regular rhythm. Normal and symmetric distal pulses are present in all extremities. No murmurs, rubs, or gallops. Respiratory: Normal respiratory effort without tachypnea nor retractions. Breath sounds are clear and equal bilaterally. No wheezes/rales/rhonchi. Gastrointestinal: Soft and nontender. No distention. No abdominal bruits.  Musculoskeletal: Nontender with normal range of motion in all extremities. No joint effusions. No lower extremity tenderness nor edema. Neurologic: Normal speech and language. No gross focal neurologic deficits are appreciated. Speech is normal. No gait instability. Skin: Skin is warm, dry and intact. No rash noted. Psychiatric: Mood and affect are normal. Speech and behavior are normal. Patient exhibits appropriate insight and judgment. Allergies:   Allergies  Allergen Reactions  . Haldol [Haloperidol Lactate] Other (See Comments)    Uncontrolled muscle movement  . Maalox [Calcium Carbonate Antacid] Other (See Comments)    Thinks this medication makes his face droop, but it could be another medication.  . Tylenol With Codeine #3 [Acetaminophen-Codeine] Palpitations   Lab Results:  Results for orders placed or performed during the hospital encounter of 10/26/14 (from the past 48 hour(s))  Lipid panel, fasting     Status: Abnormal   Collection Time: 10/26/14  1:46 PM  Result Value Ref Range   Cholesterol 184 0 - 200 mg/dL   Triglycerides  161 <096 mg/dL   HDL 53 >04 mg/dL   Total CHOL/HDL Ratio 3.5 RATIO   VLDL 21 0 - 40 mg/dL   LDL Cholesterol 540 (H) 0 - 99 mg/dL    Comment:        Total Cholesterol/HDL:CHD Risk Coronary Heart Disease Risk Table                     Men   Women  1/2 Average Risk   3.4   3.3  Average Risk       5.0   4.4  2 X Average Risk   9.6   7.1  3 X Average Risk  23.4   11.0        Use the calculated  Patient Ratio above and the CHD Risk Table to determine the patient's CHD Risk.        ATP III CLASSIFICATION (LDL):  <100     mg/dL   Optimal  161-096  mg/dL   Near or Above                    Optimal  130-159  mg/dL   Borderline  045-409  mg/dL   High  >811     mg/dL   Very High   TSH     Status: None   Collection Time: 10/26/14  1:46 PM  Result Value Ref Range   TSH 0.911 0.350 - 4.500 uIU/mL   Current Medications: Current Facility-Administered Medications  Medication Dose Route Frequency Provider Last Rate Last Dose  . amantadine (SYMMETREL) capsule 100 mg  100 mg Oral BID Jimmy Footman, MD      . LORazepam (ATIVAN) tablet 2 mg  2 mg Oral QHS Jimmy Footman, MD      . magnesium hydroxide (MILK OF MAGNESIA) suspension 30 mL  30 mL Oral Daily PRN Audery Amel, MD      . nicotine (NICODERM CQ - dosed in mg/24 hours) patch 21 mg  21 mg Transdermal Daily Shari Prows, MD   21 mg at 10/27/14 0814  . paliperidone (INVEGA) 24 hr tablet 6 mg  6 mg Oral QHS Jimmy Footman, MD       PTA Medications: No prescriptions prior to admission     Results for orders placed or performed during the hospital encounter of 10/26/14 (from the past 72 hour(s))  Lipid panel, fasting     Status: Abnormal   Collection Time: 10/26/14  1:46 PM  Result Value Ref Range   Cholesterol 184 0 - 200 mg/dL   Triglycerides 914 <782 mg/dL   HDL 53 >95 mg/dL   Total CHOL/HDL Ratio 3.5 RATIO   VLDL 21 0 - 40 mg/dL   LDL Cholesterol 621 (H) 0 - 99 mg/dL    Comment:         Total Cholesterol/HDL:CHD Risk Coronary Heart Disease Risk Table                     Men   Women  1/2 Average Risk   3.4   3.3  Average Risk       5.0   4.4  2 X Average Risk   9.6   7.1  3 X Average Risk  23.4   11.0        Use the calculated Patient Ratio above and the CHD Risk Table to determine the patient's CHD Risk.        ATP III CLASSIFICATION (LDL):  <100     mg/dL   Optimal  308-657  mg/dL   Near or Above                    Optimal  130-159  mg/dL   Borderline  846-962  mg/dL   High  >952     mg/dL   Very High   TSH     Status: None   Collection Time: 10/26/14  1:46 PM  Result Value Ref Range   TSH 0.911 0.350 - 4.500 uIU/mL     Treatment Plan Summary: Daily contact with patient to assess and evaluate symptoms and progress in treatment and Medication management   29 year old with past history of schizophrenia. Admitted after patient brought himself to  the emergency department reporting bizarre symptoms. Mother reported that the patient has been hallucinating. During assessment today patient appears to have negative symptoms of schizophrenia such as poor eye contact, flat affect, poverty of thought, thought blocking.  Schizophrenia: Patient was restarted in the emergency department on Invega 3 mg twice a day. I will change the Invega to 9 mg at bedtime.  EPS: During assessment there was evidence of myoclonic that could be secondary to treatment with anti-psychotics. He had a past history of having a severe dystonic reaction on haloperidol.   I will restart him on amantadine 100 mg by mouth twice a day.  Insomnia: For now continue Ativan 2 mg by mouth daily at bedtime.  Alcohol abuse-withdrawal: Vital signs appear to be within the normal limits. Patient is not tachycardic. For now we'll continue Ativan 1 q am and 2 qhs.  CIWA will be checked q 8 h.  Ativan prn will be offered if CIWA >15   Tobacco use disorder: Patient will be offered nicotine patch 21 mg a  day  Labs: TSH was within normal limits.  Lipid panel showed mildly increased LDL. Hemoglobin A1c is pending.  Precautions: Continue every 15 minute checks  Discharge disposition: I will discuss case with social worker as this patient is does not have medical insurance will need to apply for Medicaid.  This patient will benefit from long-acting injectable prior to discharge.    Medical Decision Making:  Established Problem, Worsening (2)  I certify that inpatient services furnished can reasonably be expected to improve the patient's condition.   Jimmy Footman 9/3/20161:16 PM

## 2014-10-27 NOTE — [facility] Suicide Risk Assessment (Signed)
[facility] Admission Suicide Risk Assessment   Nursing information obtained from:  Patient Demographic factors:  Male, Caucasian, Adolescent or young adult, Unemployed, Low socioeconomic status Current Mental Status:  NA Loss Factors:  Decrease in vocational status, Financial problems / change in socioeconomic status Historical Factors:  NA (does not wish to say) Risk Reduction Factors:  Sense of responsibility to family, Living with another person, especially a relative, Positive social support Total Time spent with patient: 1 hour Principal Problem: Schizophrenia, unspecified type Diagnosis:   Patient Active Problem List   Diagnosis Date Noted  . Schizophrenia, unspecified type [F20.9] 10/27/2014  . Tobacco use disorder [Z72.0] 10/27/2014  . Alcohol use disorder, moderate, dependence [F10.20] 10/27/2014     Continued Clinical Symptoms:    The "Alcohol Use Disorders Identification Test", Guidelines for Use in Primary Care, Second Edition.  World Science writer [facility]). Score between 0-7:  no or low risk or alcohol related problems. Score between 8-15:  moderate risk of alcohol related problems. Score between 16-19:  high risk of alcohol related problems. Score 20 or above:  warrants further diagnostic evaluation for alcohol dependence and treatment.   CLINICAL FACTORS:   Alcohol/Substance Abuse/Dependencies Schizophrenia:   Paranoid or undifferentiated type Currently Psychotic Previous Psychiatric Diagnoses and Treatments    Psychiatric Specialty Exam: Physical Exam  ROS   COGNITIVE FEATURES THAT CONTRIBUTE TO RISK:  Closed-mindedness    SUICIDE RISK:   Moderate:  Frequent suicidal ideation with limited intensity, and duration, some specificity in terms of plans, no associated intent, good self-control, limited dysphoria/symptomatology, some risk factors present, and identifiable protective factors, including available and accessible social support.  PLAN OF CARE: admit to  [facility]  Medical Decision Making:  Established Problem, Worsening (2)  I certify that inpatient services furnished can reasonably be expected to improve the patient's condition.   Shane Allen 10/27/2014, 1:15 PM

## 2014-10-27 NOTE — Progress Notes (Signed)
Patient has been pleasant and cooperative today. He shows psychomotor slowing but follows directions when given time. He showered and shaved today and has taken meds. Appetite is good. Denies SI/HI. No evidence of psychosis but he reports vague visual hallucinations and double vision. No acute distress.

## 2014-10-28 MED ORDER — NICOTINE POLACRILEX 2 MG MT GUM
2.0000 mg | CHEWING_GUM | OROMUCOSAL | Status: DC | PRN
  Administered 2014-10-28 – 2014-10-30 (×5): 2 mg via ORAL
  Filled 2014-10-28 (×11): qty 1

## 2014-10-28 NOTE — Progress Notes (Signed)
Patient ID: Shane Allen, male   DOB: [birth year], 30 y.o.   MRN: [id number]  CSW attempted to complete assessment. Pt was disorganized and appeared to be thought blocking. He gave permission to contact mother, Shirlean Mylar [phone]). CSW left message requesting call back.   Daisy Floro Dushawn Pusey MSW, LCSWA  10/28/2014 4:04 PM

## 2014-10-28 NOTE — Progress Notes (Signed)
Was in room resting at onset of shift. Did come out for evening snack. Was medication compliant and denied SI, HI, AVH. Was pleasant to interact with. Shane Allen had an uneventful night.

## 2014-10-28 NOTE — Progress Notes (Signed)
Pt visible on the unit. In good spirits. Denies hallucinations at this time. Pt states depression better and when he feels sad he tries to focus on something else.

## 2014-10-28 NOTE — Progress Notes (Signed)
[facility] MD Progress Note  10/28/2014 7:13 AM Shane Allen  MRN:  [id number] Subjective:  Patient reports doing well. He states he was able to sleep well last night. Complains that the medications are making him feel tired and he is still sleepy. He denies having hallucinations, suicidality, homicidality or problems with appetite, energy, or concentration. Other than sedation denies other side effects. Denies having any physical complaints.    Patient appears to be tolerating well invega.  No evidence of side effects.  Patient has less thought blocking today.  Latency of responses is shorter.  Per nursing: Patient has been pleasant and cooperative today. He shows psychomotor slowing but follows directions when given time. He showered and shaved today and has taken meds. Appetite is good. Denies SI/HI. No evidence of psychosis but he reports vague visual hallucinations and double vision. No acute distress.  Principal Problem: Schizophrenia, unspecified type Diagnosis:   Patient Active Problem List   Diagnosis Date Noted  . Schizophrenia, unspecified type [F20.9] 10/27/2014  . Tobacco use disorder [Z72.0] 10/27/2014  . Alcohol use disorder, moderate, dependence [F10.20] 10/27/2014  . Alcohol withdrawal [F10.239] 10/27/2014   Total Time spent with patient: 30 minutes   Past Medical History:  Past Medical History  Diagnosis Date  . Schizo affective schizophrenia   . 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   History reviewed. No pertinent past surgical history. Family History: History reviewed. No pertinent family history. Social History:  History  Alcohol Use  . Yes    Comment: 4 to 5 beers a day every other week for sleep     History  Drug Use No    Social History   Social History  . Marital Status: Single    Spouse Name: N/A  . Number of Children: N/A  . Years of Education: N/A   Social History Main Topics  . Smoking  status: Current Every Day Smoker -- 1.50 packs/day for 10 years    Types: Cigarettes  . Smokeless tobacco: Never Used  . Alcohol Use: Yes     Comment: 4 to 5 beers a day every other week for sleep  . Drug Use: No  . Sexual Activity: Yes    Birth Control/ Protection: None   Other Topics Concern  . None   Social History Narrative   Additional History:    Sleep: Good  Appetite:  Good   Assessment:   Musculoskeletal: Strength & Muscle Tone: within normal limits Gait & Station: normal Patient leans: N/A   Psychiatric Specialty Exam: Physical Exam  Review of Systems  HENT: Negative.   Eyes: Negative.   Respiratory: Negative.   Cardiovascular: Negative.   Gastrointestinal: Negative.   Genitourinary: Negative.   Musculoskeletal: Negative.   Skin: Negative.   Neurological: Positive for weakness.  Endo/Heme/Allergies: Negative.   Psychiatric/Behavioral: Negative for depression, suicidal ideas, hallucinations and substance abuse. The patient is not nervous/anxious and does not have insomnia.     Blood pressure 111/74, pulse 102, temperature 97.9 F (36.6 C), temperature source Oral, resp. rate 20, height 5\' 10"  (1.778 m), weight 97.07 kg (214 lb), SpO2 100 %.Body mass index is 30.71 kg/(m^2).  General Appearance: Disheveled  Eye Solicitor::  Fair  Speech:  Normal Rate  Volume:  Normal  Mood:  Dysphoric  Affect:  Flat  Thought Process:  Loose  Orientation:  Full (Time, Place, and Person)  Thought Content:  Hallucinations: None  Suicidal Thoughts:  No  Homicidal Thoughts:  No  Memory:  Immediate;   Fair Recent;   Fair Remote;   Fair  Judgement:  Impaired  Insight:  Lacking  Psychomotor Activity:  Decreased  Concentration:  NA  Recall:  NA  Fund of Knowledge:Fair  Language: Fair  Akathisia:  No  Handed:    AIMS (if indicated):     Assets:  Games developer Social Support  ADL's:  Intact  Cognition: WNL  Sleep:  Number of Hours:  6.75     Current Medications: Current Facility-Administered Medications  Medication Dose Route Frequency Provider Last Rate Last Dose  . amantadine (SYMMETREL) capsule 100 mg  100 mg Oral BID Jimmy Footman, MD   100 mg at 10/27/14 2115  . LORazepam (ATIVAN) tablet 1 mg  1 mg Oral Daily Jimmy Footman, MD   1 mg at 10/27/14 1450  . LORazepam (ATIVAN) tablet 2 mg  2 mg Oral QHS Jimmy Footman, MD   2 mg at 10/27/14 2115  . LORazepam (ATIVAN) tablet 2 mg  2 mg Oral Q8H PRN Jimmy Footman, MD      . magnesium hydroxide (MILK OF MAGNESIA) suspension 30 mL  30 mL Oral Daily PRN Audery Amel, MD      . nicotine (NICODERM CQ - dosed in mg/24 hours) patch 21 mg  21 mg Transdermal Daily Shari Prows, MD   21 mg at 10/27/14 0814  . paliperidone (INVEGA) 24 hr tablet 6 mg  6 mg Oral QHS Jimmy Footman, MD   6 mg at 10/27/14 2115    Lab Results:  Results for orders placed or performed during the hospital encounter of 10/26/14 (from the past 48 hour(s))  Hemoglobin A1c     Status: None   Collection Time: 10/26/14  1:46 PM  Result Value Ref Range   Hgb A1c MFr Bld 5.2 4.0 - 6.0 %  Lipid panel, fasting     Status: Abnormal   Collection Time: 10/26/14  1:46 PM  Result Value Ref Range   Cholesterol 184 0 - 200 mg/dL   Triglycerides 295 <621 mg/dL   HDL 53 >30 mg/dL   Total CHOL/HDL Ratio 3.5 RATIO   VLDL 21 0 - 40 mg/dL   LDL Cholesterol 865 (H) 0 - 99 mg/dL    Comment:        Total Cholesterol/HDL:CHD Risk Coronary Heart Disease Risk Table                     Men   Women  1/2 Average Risk   3.4   3.3  Average Risk       5.0   4.4  2 X Average Risk   9.6   7.1  3 X Average Risk  23.4   11.0        Use the calculated Patient Ratio above and the CHD Risk Table to determine the patient's CHD Risk.        ATP III CLASSIFICATION (LDL):  <100     mg/dL   Optimal  784-696  mg/dL   Near or Above                    Optimal   130-159  mg/dL   Borderline  295-284  mg/dL   High  >132     mg/dL   Very High   TSH     Status: None   Collection Time: 10/26/14  1:46 PM  Result Value Ref Range   TSH 0.911 0.350 - 4.500 uIU/mL    Physical Findings: AIMS: Facial and Oral Movements Muscles of Facial Expression: None, normal Lips and Perioral Area: None, normal Jaw: None, normal Tongue: None, normal,Extremity Movements Upper (arms, wrists, hands, fingers): None, normal Lower (legs, knees, ankles, toes): None, normal, Trunk Movements Neck, shoulders, hips: None, normal, Overall Severity Severity of abnormal movements (highest score from questions above): None, normal Incapacitation due to abnormal movements: None, normal, Dental Status Current problems with teeth and/or dentures?: Yes Does patient usually wear dentures?: No  CIWA:  CIWA-Ar Total: 0 COWS:     Treatment Plan Summary: Daily contact with patient to assess and evaluate symptoms and progress in treatment   29 year old with past history of schizophrenia. Admitted after patient brought himself to the emergency department reporting bizarre symptoms. Mother reported that the patient has been hallucinating. During assessment today patient appears to have negative symptoms of schizophrenia such as poor eye contact, flat affect, poverty of thought, thought blocking.  Schizophrenia: Patient was restarted in the emergency department on Invega 3 mg twice a day.  Hinda Glatter has been increased to 9 mg at bedtime.  EPS: During assessment  On 9/3 there was evidence of myoclonic jerks that could be secondary to treatment with anti-psychotics. He had a past history of having a severe dystonic reaction on haloperidol. Pt was started on amantadine 100 mg by mouth twice a day.  Insomnia: For now continue Ativan 2 mg by mouth daily at bedtime.  Alcohol abuse-withdrawal: Vital signs appear to be within the normal limits. Patient is  tachycardic. For now we'll continue Ativan  1 q am and 2 qhs. CIWA has been 0.  Tobacco use disorder: Patient will be offered nicotine patch 21 mg a day  Labs: TSH was within normal limits. Lipid panel showed mildly increased LDL. Hemoglobin A1c is also wnl  Precautions: Continue every 15 minute checks  Social worker will contact patient's mother for collateral information.  Discharge disposition: I will discuss case with social worker as this patient is does not have medical insurance will need to apply for Medicaid. This patient will benefit from long-acting injectable prior to discharge.  Medical Decision Making:  Established Problem, Stable/Improving (1)     Jimmy Footman 10/28/2014, 7:13 AM

## 2014-10-28 NOTE — Progress Notes (Signed)
Patient has been pleasant and cooperative todayt follows directions. and has taken meds. Appetite is good. Denies SI/HI. No evidence of psychosis but he reports vague visual hallucinations and double vision. No acute distress.

## 2014-10-28 NOTE — [facility] Group Notes (Signed)
[facility] Group Notes:  (Nursing/MHT/Case Management/Adjunct)  Date:  10/28/2014  Time:  8:49 AM  Type of Therapy:  Group Therapy  Participation Level:  Did Not Attend  Summary of Progress/Problems:  Akacia Boltz De'Chelle Bjorn Hallas 10/28/2014, 8:49 AM

## 2014-10-28 NOTE — [facility] Group Notes (Signed)
[facility] LCSW Group Therapy  10/28/2014 3:15 PM  Type of Therapy: Group Therapy  Participation Level: Minimal   Participation Quality: Attentive  Affect: Flat   Cognitive: Disorganized   Insight: Limited  Engagement in Therapy: Limited  Modes of Intervention: Discussion, Education, Socialization and Support  Summary of Progress/Problems: Todays topic: Grudges Patients will be encouraged to discuss their thoughts, feelings, and behaviors as to why one holds on to grudges and reasons why people have grudges. Patients will process the impact of grudges on their daily lives and identify thoughts and feelings related to holding grudges. Patients will identify feelings and thoughts related to what life would look like without grudges. Brnadon attended group and stayed the entire time. He sat quietly and listened to other group members   [employer] MSW, LCSWA  10/28/2014, 3:15 PM

## 2014-10-29 NOTE — Progress Notes (Signed)
[facility] MD Progress Note  10/29/2014 1:41 PM Shane Allen  MRN:  [id number] Subjective:  Patient reports doing well. He states he was able to sleep well last night. Patient denies having SI, HI or auditory or visual hallucinations. He denies having side effects from medications today. He denies having any physical complaints today. Denies major problems with sleep appetite, energy or concentration.  Not interested in [place] at this time.  Patient appears to be tolerating well invega.  No evidence of side effects.  Patient has less thought blocking today.  Latency of responses is shorter.  Patient has been attending groups.  Per nursing: Pt visible on the unit. In good spirits. Denies hallucinations at this time. Pt states depression better and when he feels sad he tries to focus on something else.  Principal Problem: Schizophrenia, unspecified type Diagnosis:   Patient Active Problem List   Diagnosis Date Noted  . Schizophrenia, unspecified type [F20.9] 10/27/2014  . Tobacco use disorder [Z72.0] 10/27/2014  . Alcohol use disorder, moderate, dependence [F10.20] 10/27/2014  . Alcohol withdrawal [F10.239] 10/27/2014   Total Time spent with patient: 30 minutes   Past Medical History:  Past Medical History  Diagnosis Date  . Schizo affective schizophrenia   . 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   History reviewed. No pertinent past surgical history. Family History: History reviewed. No pertinent family history. Social History:  History  Alcohol Use  . Yes    Comment: 4 to 5 beers a day every other week for sleep     History  Drug Use No    Social History   Social History  . Marital Status: Single    Spouse Name: N/A  . Number of Children: N/A  . Years of Education: N/A   Social History Main Topics  . Smoking status: Current Every Day Smoker -- 1.50 packs/day for 10 years    Types: Cigarettes  . Smokeless  tobacco: Never Used  . Alcohol Use: Yes     Comment: 4 to 5 beers a day every other week for sleep  . Drug Use: No  . Sexual Activity: Yes    Birth Control/ Protection: None   Other Topics Concern  . None   Social History Narrative   Additional History:    Sleep: Good  Appetite:  Good   Assessment:   Musculoskeletal: Strength & Muscle Tone: within normal limits Gait & Station: normal Patient leans: N/A   Psychiatric Specialty Exam: Physical Exam   Review of Systems  HENT: Negative.   Eyes: Negative.   Respiratory: Negative.   Cardiovascular: Negative.   Gastrointestinal: Negative.   Genitourinary: Negative.   Musculoskeletal: Negative.   Skin: Negative.   Neurological: Positive for weakness.  Endo/Heme/Allergies: Negative.   Psychiatric/Behavioral: Negative for depression, suicidal ideas, hallucinations and substance abuse. The patient is not nervous/anxious and does not have insomnia.     Blood pressure 115/81, pulse 66, temperature 98.2 F (36.8 C), temperature source Oral, resp. rate 20, height  (1.778 m), weight 97.07 kg (214 lb), SpO2 100 %.Body mass index is 30.71 kg/(m^2).  General Appearance: Disheveled  Eye Solicitor::  Fair  Speech:  Normal Rate  Volume:  Normal  Mood:  Dysphoric  Affect:  Flat  Thought Process:  Loose  Orientation:  Full (Time, Place, and Person)  Thought Content:  Hallucinations: None  Suicidal Thoughts:  No  Homicidal Thoughts:  No  Memory:  Immediate;   Fair Recent;   Fair Remote;   Fair  Judgement:  Impaired  Insight:  Lacking  Psychomotor Activity:  Decreased  Concentration:  NA  Recall:  NA  Fund of Knowledge:Fair  Language: Fair  Akathisia:  No  Handed:    AIMS (if indicated):     Assets:  Games developer Social Support  ADL's:  Intact  Cognition: WNL  Sleep:  Number of Hours: 7.5     Current Medications: Current Facility-Administered Medications  Medication Dose Route  Frequency Provider Last Rate Last Dose  . amantadine (SYMMETREL) capsule 100 mg  100 mg Oral BID Jimmy Footman, MD   100 mg at 10/29/14 0818  . LORazepam (ATIVAN) tablet 2 mg  2 mg Oral QHS Jimmy Footman, MD   2 mg at 10/28/14 2108  . magnesium hydroxide (MILK OF MAGNESIA) suspension 30 mL  30 mL Oral Daily PRN Audery Amel, MD      . nicotine polacrilex (NICORETTE) gum 2 mg  2 mg Oral PRN Jimmy Footman, MD   2 mg at 10/29/14 1237  . paliperidone (INVEGA) 24 hr tablet 6 mg  6 mg Oral QHS Jimmy Footman, MD   6 mg at 10/28/14 2108    Lab Results:  No results found for this or any previous visit (from the past 48 hour(s)).  Physical Findings: AIMS: Facial and Oral Movements Muscles of Facial Expression: None, normal Lips and Perioral Area: None, normal Jaw: None, normal Tongue: None, normal,Extremity Movements Upper (arms, wrists, hands, fingers): None, normal Lower (legs, knees, ankles, toes): None, normal, Trunk Movements Neck, shoulders, hips: None, normal, Overall Severity Severity of abnormal movements (highest score from questions above): None, normal Incapacitation due to abnormal movements: None, normal, Dental Status Current problems with teeth and/or dentures?: Yes Does patient usually wear dentures?: No  CIWA:  CIWA-Ar Total: 0 COWS:     Treatment Plan Summary: Daily contact with patient to assess and evaluate symptoms and progress in treatment   29 year old with past history of schizophrenia. Admitted after patient brought himself to the emergency department reporting bizarre symptoms. Mother reported that the patient has been hallucinating. During assessment today patient appears to have negative symptoms of schizophrenia such as poor eye contact, flat affect, poverty of thought, thought blocking.  Schizophrenia: Patient was restarted in the emergency department on Invega 3 mg twice a day.  Hinda Glatter has been changed to 6 mg by  mouth daily at bedtime. Appears affective in patient has been tolerating it well.  EPS: During assessment  On 9/3 there was evidence of myoclonic jerks that could be secondary to treatment with anti-psychotics. He had a past history of having a severe dystonic reaction on haloperidol. Pt was started on amantadine 100 mg by mouth twice a day.  Insomnia: For now continue Ativan 2 mg by mouth daily at bedtime.  Alcohol abuse-withdrawal: No evidence of alcohol withdrawal at this time  Tobacco use disorder: Patient will be offered nicotine patch 21 mg a day  Labs: TSH was within normal limits. Lipid panel showed mildly increased LDL. Hemoglobin A1c is also wnl  Precautions: Continue every 15 minute checks  Social worker will contact patient's mother for collateral information.  Discharge disposition: I will discuss case with social worker as this patient is does not have medical insurance will need to apply for Medicaid. This patient will benefit from long-acting injectable prior to discharge.  Patient will potentially be discharge in the next day  or 2.  Medical Decision Making:  Established Problem, Stable/Improving (1)     Jimmy Footman 10/29/2014, 1:41 PM

## 2014-10-29 NOTE — Progress Notes (Signed)
Recreation Therapy Notes  Date: 09.05.16 Time: 3:00 pm Location: Craft Room  Group Topic: Wellness  Goal Area(s) Addresses:  Patient will identify at least one item per dimension of health. Patient will examine areas they are deficient in.  Behavioral Response: Arrived late, Attentive  Intervention: 6 Dimensions of Health  Activity: Patients were given a worksheet with the definitions of the 6 dimensions of health. Patients were given a worksheet with the 6 dimensions on it and instructed to list 2-3 things per each item.   Education: LRT educated patients on how the activity was related to their admission and d/c.   Education Outcome: Patient arrived at the end of group.  Clinical Observations/Feedback: Patient arrived to group at approximately 3:37 pm. LRT gave patient worksheet and explained activity. Patient did not work on activity. Patient did not contribute to group discussion.  Jacquelynn Cree, LRT/CTRS 10/29/2014 4:16 PM

## 2014-10-29 NOTE — Progress Notes (Signed)
Pt has been pleasant and cooperative with care. Med and group compliant. Nicotine gum given with good relief. Visible in mileu. Denies SI, HI. Endorses visual hallucinations. Encouragement and support offered. Pt receptive. Remains safe on unit with 15 min checks.

## 2014-10-29 NOTE — Plan of Care (Signed)
Problem: Ineffective individual coping Goal: LTG: Patient will report a decrease in negative feelings Outcome: Progressing Pt reports feeling better Goal: STG: Patient will remain free from self harm Outcome: Progressing No self harm reported or observed

## 2014-10-29 NOTE — [facility] Group Notes (Signed)
[facility] LCSW Aftercare Discharge Planning Group Note   10/29/2014 11:44 AM  Participation Quality:  Patient participated in group appropriately and shared that his SMART goal is to "develop new things to learn about myself, how to deal with the past and to discharge". Patient shared that he struggled with the past but did not open up with any specific stressors in his life.   Mood/Affect:  Depressed   Thoughts of Suicide:  No Will you contract for safety?   NA  Current AVH:  No    Lulu Riding, MSW, [employer]

## 2014-10-29 NOTE — [facility] Group Notes (Signed)
[facility] LCSW Group Therapy  10/29/2014 2:40 PM  Type of Therapy:  Group Therapy  Participation Level:  Active  Participation Quality:  Appropriate and Attentive  Affect:  Appropriate  Cognitive:  Alert, Appropriate and Oriented  Insight:  Improving  Engagement in Therapy:  Improving  Modes of Intervention:  Socialization and Support  Summary of Progress/Problems: Patient attended and shared that his dream vacation would be "[place] to see where He walked and [place]". Patient was able to relate to struggles with substance use and shared that he has struggled with prescriptions meds and would go to the dentist just to get a prescription as he was abusing them.   Lulu Riding, MSW, LCSWA 10/29/2014, 2:40 PM

## 2014-10-29 NOTE — Plan of Care (Signed)
Problem: Ineffective individual coping Goal: STG: Patient will remain free from self harm Outcome: Progressing Pt safe on the unit at this time   Problem: Alteration in mood & ability to function due to Goal: LTG-Pt reports reduction in suicidal thoughts (Patient reports reduction in suicidal thoughts and is able to verbalize a safety plan for whenever patient is feeling suicidal)  Outcome: Progressing Pt denies SI at this time      

## 2014-10-29 NOTE — Progress Notes (Signed)
Recreation Therapy Notes  INPATIENT RECREATION THERAPY ASSESSMENT  Patient Details Name: Shane Allen MRN: [id number] DOB: 1985-11-28 Today's Date: 10/29/2014  Patient Stressors:  Patient reported no stressors  Coping Skills:   Exercise, Art/Dance, Talking, Music, Sports, Other (Comment) (Deep breathing)  Personal Challenges: Anger, Trusting Others  Leisure Interests (2+):  Therapist, art, Music - Recruitment consultant Resources:  Yes  Community Resources:  Research scientist (physical sciences), Other (Comment) [facility])  Current Use: Yes  If no, Barriers?:    Patient Strengths:  Determined, loving  Patient Identified Areas of Improvement:  Mathematics  Current Recreation Participation:  Nothing  Patient Goal for Hospitalization:  To get back on track with normal life, get a job, and do day to day normal things  [place] of Residence:  [place] of Residence:  [place]   Current SI (including self-harm):  No  Current HI:  No  Consent to Intern Participation: N/A   Jacquelynn Cree, LRT/CTRS 10/29/2014, 4:45 PM

## 2014-10-29 NOTE — Progress Notes (Signed)
D: Pt denies SI/HI/AVH. Pt is pleasant and cooperative. Pt appears to be in denial about his situation. Pt stated his only problem that led him to coming in the hospital was he was having hallucinations only due to lack of sleep. Pt forwarded little to this writer, but stated he would take his medications.   A: Pt was offered support and encouragement. Pt was given scheduled medications. Pt was encourage to attend groups. Q 15 minute checks were done for safety.   R:Pt attends groups and interacts well with peers and staff. Pt is taking medication. Pt has no complaints at this time .Pt receptive to treatment and safety maintained on unit.

## 2014-10-30 ENCOUNTER — Encounter: Payer: Self-pay | Admitting: Psychiatry

## 2014-10-30 MED ORDER — AMANTADINE HCL 100 MG PO CAPS: 100.0000 mg | ORAL_CAPSULE | Freq: Two times a day (BID) | ORAL | Status: DC

## 2014-10-30 MED ORDER — PALIPERIDONE ER 6 MG PO TB24: 6.0000 mg | ORAL_TABLET | Freq: Every day | ORAL | Status: DC

## 2014-10-30 MED ORDER — LORAZEPAM 2 MG PO TABS: 2.0000 mg | ORAL_TABLET | Freq: Every day | ORAL | Status: DC

## 2014-10-30 MED ORDER — NICOTINE POLACRILEX 2 MG MT GUM
2.0000 mg | CHEWING_GUM | OROMUCOSAL | Status: DC | PRN
  Administered 2014-10-30: 2 mg via ORAL

## 2014-10-30 MED ORDER — PALIPERIDONE PALMITATE 156 MG/ML IM SUSP: 156.0000 mg | Freq: Once | INTRAMUSCULAR | Status: DC

## 2014-10-30 MED ORDER — PALIPERIDONE PALMITATE 234 MG/1.5ML IM SUSP
234.0000 mg | Freq: Once | INTRAMUSCULAR | Status: AC
  Administered 2014-10-30: 234 mg via INTRAMUSCULAR
  Filled 2014-10-30: qty 1.5

## 2014-10-30 NOTE — Progress Notes (Signed)
Recreation Therapy Notes  INPATIENT RECREATION TR PLAN  Patient Details Name: TAAJ HURLBUT MRN: [id number] DOB: 1986-02-11 Today's Date: 10/30/2014  Rec Therapy Plan Is patient appropriate for Therapeutic Recreation?: Yes Treatment times per week: At least once a week TR Treatment/Interventions: 1:1 session, Group participation (Comment) (Appropriate participation in daily recreation therapy tx)  Discharge Criteria Pt will be discharged from therapy if:: Discharged Treatment plan/goals/alternatives discussed and agreed upon by:: Patient/family  Discharge Summary Short term goals set: See Care Plan Short term goals met: Complete Progress toward goals comments: One-to-one attended Which groups?: Wellness, Other (Comment) (Self-expression) One-to-one attended: Anger management Reason goals not met: N/A Therapeutic equipment acquired: None Reason patient discharged from therapy: Discharge from hospital Pt/family agrees with progress & goals achieved: Yes Date patient discharged from therapy: 10/30/14   Leon[facility], LRT/CTRS 10/30/2014, 5:34 PM

## 2014-10-30 NOTE — [facility] Suicide Risk Assessment (Signed)
[facility] INPATIENT:  Family/Significant Other Suicide Prevention Education  Suicide Prevention Education:  Education Completed; Shirlean Mylar (mother) [phone] has been identified by the patient as the family member/significant other with whom the patient will be residing, and identified as the person(s) who will aid the patient in the event of a mental health crisis (suicidal ideations/suicide attempt).  With written consent from the patient, the family member/significant other has been provided the following suicide prevention education, prior to the and/or following the discharge of the patient.  The suicide prevention education provided includes the following:  Suicide risk factors  Suicide prevention and interventions  National Suicide Hotline telephone number  [facility] assessment telephone number  [facility] Emergency Assistance 911  [facility] and/or Residential Mobile Crisis Unit telephone number  Request made of family/significant other to:  Remove weapons (e.g., guns, rifles, knives), all items previously/currently identified as safety concern.    Remove drugs/medications (over-the-counter, prescriptions, illicit drugs), all items previously/currently identified as a safety concern.  The family member/significant other verbalizes understanding of the suicide prevention education information provided.  The family member/significant other agrees to remove the items of safety concern listed above.  Lulu Riding , MSW, LCSWA  10/30/2014, 4:31 PM

## 2014-10-30 NOTE — [facility] Suicide Risk Assessment (Signed)
[facility] Discharge Suicide Risk Assessment   Demographic Factors:  Male and Adolescent or young adult  Total Time spent with patient: 30 minutes  Musculoskeletal: Strength & Muscle Tone: within normal limits Gait & Station: normal Patient leans: N/A  Psychiatric Specialty Exam: Physical Exam  Nursing note and vitals reviewed.   Review of Systems  All other systems reviewed and are negative.   Blood pressure 115/79, pulse 97, temperature 98 F (36.7 C), temperature source Oral, resp. rate 20, height  (1.778 m), weight 97.07 kg (214 lb), SpO2 100 %.Body mass index is 30.71 kg/(m^2).  General Appearance: Casual  Eye Contact::  Good  Speech:  Clear and Coherent409  Volume:  Normal  Mood:  Euthymic  Affect:  Appropriate  Thought Process:  Goal Directed  Orientation:  Full (Time, Place, and Person)  Thought Content:  WDL  Suicidal Thoughts:  No  Homicidal Thoughts:  No  Memory:  Immediate;   Fair Recent;   Fair Remote;   Fair  Judgement:  Fair  Insight:  Fair  Psychomotor Activity:  Normal  Concentration:  Fair  Recall:  [employer] of Knowledge:Fair  Language: Fair  Akathisia:  No  Handed:  Right  AIMS (if indicated):     Assets:  Communication Skills Desire for Improvement Financial Resources/Insurance  Sleep:  Number of Hours: 6.45  Cognition: WNL  ADL's:  Intact   Have you used any form of tobacco in the last 30 days? (Cigarettes, Smokeless Tobacco, Cigars, and/or Pipes): Yes  Has this patient used any form of tobacco in the last 30 days? (Cigarettes, Smokeless Tobacco, Cigars, and/or Pipes) Yes, A prescription for an FDA-approved tobacco cessation medication was offered at discharge and the patient refused  Mental Status Per Nursing Assessment::   On Admission:  NA  Current Mental Status by Physician: NA  Loss Factors: NA  Historical Factors: NA  Risk Reduction Factors:   Sense of responsibility to family and Positive social support  Continued  Clinical Symptoms:  Schizophrenia:   Depressive state  Cognitive Features That Contribute To Risk:  None    Suicide Risk:  Minimal: No identifiable suicidal ideation.  Patients presenting with no risk factors but with morbid ruminations; may be classified as minimal risk based on the severity of the depressive symptoms  Principal Problem: Schizophrenia, unspecified type Discharge Diagnoses:  Patient Active Problem List   Diagnosis Date Noted  . Schizophrenia, unspecified type [F20.9] 10/27/2014  . Tobacco use disorder [Z72.0] 10/27/2014  . Alcohol use disorder, moderate, dependence [F10.20] 10/27/2014  . Alcohol withdrawal [F10.239] 10/27/2014    Follow-up Information    Follow up with [facility].   Specialty:  [facility] information:   [address] [place] [address] [phone]       Plan Of Care/Follow-up recommendations:  Activity:  as tolerated. Diet:  regular. Other:  to follow-up appointments.  Is patient on multiple antipsychotic therapies at discharge:  No   Has Patient had three or more failed trials of antipsychotic monotherapy by history:  No  Recommended Plan for Multiple Antipsychotic Therapies: NA    Dhana Totton 10/30/2014, 11:45 AM

## 2014-10-30 NOTE — Progress Notes (Signed)
D:Patient aware of discharge this shift . Patient returning home . Patient had no  belonging locked up . Patient denies  Suicidal  And homicidal ideations  .  A: Writer instructed on discharge criteria  . Informed of 7 day supply  and prescriptions  given to group home staff . Aware  Of follow up appointment . R: Patient left unit with no questions  Or concerns  With mother

## 2014-10-30 NOTE — Progress Notes (Signed)
  [facility] Adult Case Management Discharge Plan :  Will you be returning to the same living situation after discharge:  Yes,  home with mother At discharge, do you have transportation home?: Yes,  mom will pick up at discharge Do you have the ability to pay for your medications: Yes,  patient is connected with [facility] for medication assistance  Release of information consent forms completed and in the chart;  Patient's signature needed at discharge.  Patient to Follow up at: Follow-up Information    Follow up with [facility]. Go on 11/01/2014.   Why:  For follow-up care appointment Thursday 11/01/14 at 8:00am; (walk in hours M-F 8am-4pm)   Contact information:   [address] [place], [place] Ph [phone] Fax [phone]       Patient denies SI/HI: Yes,  patient denies SI/HI    Safety Planning and Suicide Prevention discussed: Yes,  SPE discussed with patient and his mother Shirlean Mylar [phone]  Have you used any form of tobacco in the last 30 days? (Cigarettes, Smokeless Tobacco, Cigars, and/or Pipes): Yes  Has patient been referred to the Quitline?: Patient refused referral  Lulu Riding, MSW, LCSWA 10/30/2014, 4:32 PM

## 2014-10-30 NOTE — Plan of Care (Signed)
Problem: Alteration in thought process Goal: LTG-Patient has not harmed self or others in at least 2 days Outcome: Progressing Denies suicidal ideations , no harm to self

## 2014-10-30 NOTE — Discharge Summary (Signed)
Physician Discharge Summary Note  Patient:  Shane Allen is an 29 y.o., male MRN:  [id number] DOB:  1985/09/17 Patient phone:  [phone] (home)  Patient address:   [address] [place] [address],  Total Time spent with patient: 30 minutes  Date of Admission:  10/26/2014 Date of Discharge: 10/30/2014  Reason for Admission:  Insomnia and double vision.  Shane Allen is a 29 y.o. male with h/o schizophrenia who presented to our ER with auditory and visual hallucinations. [facility]. Mom states when he left [facility] he was worse often when he entered. Patient is still hallucinating  Per ER psychiatrist: Patient came voluntarily to the emergency room on 9/2. His complaint to me is that he is having what he calls "double vision". When I ask him to describe that he says that it occurs when he looks away from an object that it will seem to move in his line of sight. He is not really describing what would typically be thought of as double vision. Additionally he talks about having "hallucinations". He uses the word several times but is nonspecific about what he means by it. He also complains of sleeping difficulty. He says his days and nights are wrong. I asked him if this meant was he sleeping during the daytime and up at night and he told me that in fact he was up at night and up during the daytime. Patient is not currently taking any psychiatric medicine. Sounds like he's been off of all psychiatric medicine for about a month since he's been home from his last hospitalization. He claims that he does drink several times a week. He's very vague about how much she is drinking. It so confusing it's not very reliable. Denies that he is using other drugs. Patient says he has stress from financial problems and from his symptoms of hallucinations.  Substance abuse history: Patient's states he drinks about 6 beers a day but claims  that he does not drink every day. He denies past history of withdrawals, blackouts or alcohol withdrawal seizures. Looks like when he was initially evaluated in the emergency department he had reported drinking up to 18 beers a day. The patient states he used to smoke marijuana but has not smoked marijuana in 2 years. He currently smokes cigarettes about 1-1/2 pack a day.   HPI Elements: Quality: Confusion hallucinations psychotic symptoms. Severity: Moderate to severe and worsening. Very dysfunctional.. Timing: Chronic it seems like although may be getting worse. Duration: Ongoing symptoms of hallucinations. Context: Medicine noncompliance possible substance abuse.  Past psychiatric history: He was admitted to our hospital in 2014 with an acute psychosis that resolved fairly quickly on medicine (diagnosed with schizophreniform disorder). According to the mother he has continued to have what sounds like a lot of negative symptoms of psychosis. He has had probably 2 other psychiatric hospitalizations. He says that he was at [facility] just last month. At first he told us that he didn't take any medicine while he was there but then he clarified that in fact he did take Abilify. Has not been taking medicine since he's been home. It doesn't appear that he has been seeing an outpatient provider at any point for psychiatric treatment. No clear history of substance abuse problems. No known history of suicide attempts or violence. He had a bad reaction to Haldol when he was hospitalized here with a lot of dystonia but tolerated  Navane. He says that he's been treated with Abilify more recently.  Past Medical History: Patient has no significant ongoing medical problems that we know of for certain. Based on his medicine looks like he may have been treated for gastric reflux.                   History reviewed. No pertinent past surgical history.  Family History: History reviewed. No pertinent  family history. patient denies having any relatives with mental illness, substance abuse, alcoholism or suicides in his family.  Social History: Lives with his mother in [place]. Not working. Says he spends most of his time just laying around the house not doing much. Not married. No children. Denies having medical insurance. 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 Patient has a ninth-grade education he dropped out of school due to having to find a job to help his mother financially. The patient has 3 brothers and 2 sisters and reports having a good relationship with them. As far as his legal history he denies ever having any legal trouble.     Principal Problem: Schizophrenia, unspecified type Discharge Diagnoses: Patient Active Problem List   Diagnosis Date Noted  . Schizophrenia, unspecified type [F20.9] 10/27/2014  . Tobacco use disorder [Z72.0] 10/27/2014  . Alcohol use disorder, moderate, dependence [F10.20] 10/27/2014  . Alcohol withdrawal [F10.239] 10/27/2014    Musculoskeletal: Strength & Muscle Tone: within normal limits Gait & Station: normal Patient leans: N/A  Psychiatric Specialty Exam: Physical Exam  Nursing note and vitals reviewed.   Review of Systems  All other systems reviewed and are negative.   Blood pressure 115/79, pulse 97, temperature 98 F (36.7 C), temperature source Oral, resp. rate 20, height  (1.778 m), weight 97.07 kg (214 lb), SpO2 100 %.Body mass index is 30.71 kg/(m^2).  See SRA.                                                  Sleep:  Number of Hours: 6.45   Have you used any form of tobacco in the last 30 days? (Cigarettes, Smokeless Tobacco, Cigars, and/or Pipes): Yes  Has this patient used any form of tobacco in the last 30 days? (Cigarettes, Smokeless Tobacco, Cigars, and/or Pipes) Yes, A prescription for an FDA-approved tobacco  cessation medication was offered at discharge and the patient refused  Past Medical History:  Past Medical History  Diagnosis Date  . Schizo affective schizophrenia   . 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   History reviewed. No pertinent past surgical history. Family History: History reviewed. No pertinent family history. Social History:  History  Alcohol Use  . Yes    Comment: 4 to 5 beers a day every other week for sleep     History  Drug Use No    Social History   Social History  . Marital Status: Single    Spouse Name: N/A  . Number of Children: N/A  . Years of Education: N/A   Social History Main Topics  . Smoking status: Current Every Day Smoker -- 1.50 packs/day for 10 years    Types: Cigarettes  . Smokeless tobacco: Never Used  . Alcohol Use: Yes  Comment: 4 to 5 beers a day every other week for sleep  . Drug Use: No  . Sexual Activity: Yes    Birth Control/ Protection: None   Other Topics Concern  . None   Social History Narrative    Past Psychiatric History: Hospitalizations:  Outpatient Care:  Substance Abuse Care:  Self-Mutilation:  Suicidal Attempts:  Violent Behaviors:   Risk to Self: Is patient at risk for suicide?: No Risk to Others:   Prior Inpatient Therapy:   Prior Outpatient Therapy:    Level of Care:  OP  Hospital Course:    29 year old with past history of schizophrenia. Admitted after patient brought himself to the emergency department reporting bizarre symptoms. Mother reported that the patient has been hallucinating. During assessment today patient appears to have negative symptoms of schizophrenia such as poor eye contact, flat affect, poverty of thought, thought blocking.  1. Psychosis. Patient was restarted in the emergency department on Invega 3 mg twice a day. Hinda Glatter has been changed to 6 mg by mouth daily at bedtime. Appears affective in patient has been  tolerating it well.  2. EPS: During assessment On 9/3 there was evidence of myoclonic jerks that could be secondary to treatment with anti-psychotics. He had a past history of having a severe dystonic reaction on haloperidol. Pt was started on amantadine 100 mg by mouth twice a day.  3. Insomnia: For now continue Ativan 2 mg by mouth daily at bedtime.  4. Alcohol abuse-withdrawal: No evidence of alcohol withdrawal at this time  5. Tobacco use disorder: Patient will be offered nicotine patch 21 mg a day  6. Labs: TSH was within normal limits. Lipid panel showed mildly increased LDL. Hemoglobin A1c is not elevated.  7. Discharge planning. He was discharged to home with his mother. The patient has no pertinent insurance. He will follow up with [facility].  Consults:  None  Significant Diagnostic Studies:  None  Discharge Vitals:   Blood pressure 115/79, pulse 97, temperature 98 F (36.7 C), temperature source Oral, resp. rate 20, height 5\' 10"  (1.778 m), weight 97.07 kg (214 lb), SpO2 100 %. Body mass index is 30.71 kg/(m^2). Lab Results:   No results found for this or any previous visit (from the past 72 hour(s)).  Physical Findings: AIMS: Facial and Oral Movements Muscles of Facial Expression: None, normal Lips and Perioral Area: None, normal Jaw: None, normal Tongue: None, normal,Extremity Movements Upper (arms, wrists, hands, fingers): None, normal Lower (legs, knees, ankles, toes): None, normal, Trunk Movements Neck, shoulders, hips: None, normal, Overall Severity Severity of abnormal movements (highest score from questions above): None, normal Incapacitation due to abnormal movements: None, normal, Dental Status Current problems with teeth and/or dentures?: Yes Does patient usually wear dentures?: No  CIWA:  CIWA-Ar Total: 0 COWS:      See Psychiatric Specialty Exam and Suicide Risk Assessment completed by Attending Physician prior to discharge.  Discharge destination:   Home  Is patient on multiple antipsychotic therapies at discharge:  No   Has Patient had three or more failed trials of antipsychotic monotherapy by history:  No    Recommended Plan for Multiple Antipsychotic Therapies: NA  Discharge Instructions    Diet - low sodium heart healthy    Complete by:  As directed      Increase activity slowly    Complete by:  As directed             Medication List    TAKE these medications  Indication   amantadine 100 MG capsule  Commonly known as:  SYMMETREL  Take 1 capsule (100 mg total) by mouth 2 (two) times daily.   Indication:  Extrapyramidal Reaction caused by Medications     paliperidone 6 MG 24 hr tablet  Commonly known as:  INVEGA  Take 1 tablet (6 mg total) by mouth at bedtime.   Indication:  Schizophrenia           Follow-up Information    Follow up with [facility].   Specialty:  [facility] information:   [address] [place] [address] [phone]       Follow-up recommendations:  Activity:  as tolerated. Diet:  regular. Other:  keep follow-up appointments.  Comments:    Total Discharge Time: 35 min.  Signed: Kristine Linea 10/30/2014, 11:48 AM

## 2014-10-30 NOTE — Progress Notes (Signed)
Recreation Therapy Notes  Date: 09.06.16 Time: 3:00 pm Location: Craft Room  Group Topic: Self-expression  Goal Area(s) Addresses:  Patient will identify one color per emotion listed on wheel. Patient will verbalize benefit of using art as a means of self-expression. Patient will verbalize one emotion experienced during session. Patient will be educated on other forms of self-expression.  Behavioral Response: Attentive  Intervention: Emotion Wheel  Activity: Patients were given a worksheet with 7 different emotions and were instructed to pick a color for each emotion.   Education: LRT educated patients on different forms of self-expression.  Education Outcome: In group clarification offered   Clinical Observations/Feedback: Patient picked different colors for each emotion. Patient contributed to group discussion by stating what color he picked for one emotion and why he picked that color.  Jacquelynn Cree, LRT/CTRS 10/30/2014 4:48 PM

## 2014-10-30 NOTE — Plan of Care (Signed)
Problem: [facility] Participation in Recreation Therapeutic Interventions Goal: STG-Patient will identify at least five coping skills for ** STG: Coping Skills - Within 3 treatment sessions, patient will verbalize at least 5 coping skills for anger in one treatment session to increase anger management skills post d/c.  Outcome: Completed/Met Date Met:  10/30/14 Treatment Session 1; Completed 1 out of 1: At approximately 9:25 am, LRT met with patient in craft room. Patient verbalized 5 coping skills for anger. Patient verbalized what triggers him to get angry, how his body responds to anger, and how he is going to remember to use his healthy coping skills. LRT provided suggestions as well.  [facility], LRT/CTRS 09.06.16 2:02 pm

## 2014-10-30 NOTE — [facility] Group Notes (Signed)
[facility] Group Notes:  (Nursing/MHT/Case Management/Adjunct)  Date:  10/30/2014  Time:  3:41 PM  Type of Therapy:  Psychoeducational Skills  Participation Level:  Active  Participation Quality:  Appropriate  Affect:  Blunted  Cognitive:  Appropriate  Insight:  Appropriate  Engagement in Group:  Supportive  Modes of Intervention:  Support  Summary of Progress/Problems:  Shane Allen 10/30/2014, 3:41 PM

## 2014-11-17 ENCOUNTER — Other Ambulatory Visit: Payer: Self-pay

## 2014-11-17 ENCOUNTER — Emergency Department (HOSPITAL_COMMUNITY): Payer: Self-pay

## 2014-11-17 ENCOUNTER — Encounter (HOSPITAL_COMMUNITY): Payer: Self-pay | Admitting: Emergency Medicine

## 2014-11-17 DIAGNOSIS — Z720 Tobacco use: Secondary | ICD-10-CM | POA: Insufficient documentation

## 2014-11-17 DIAGNOSIS — F209 Schizophrenia, unspecified: Secondary | ICD-10-CM | POA: Insufficient documentation

## 2014-11-17 DIAGNOSIS — Z79899 Other long term (current) drug therapy: Secondary | ICD-10-CM | POA: Insufficient documentation

## 2014-11-17 DIAGNOSIS — R079 Chest pain, unspecified: Secondary | ICD-10-CM | POA: Insufficient documentation

## 2014-11-17 LAB — CBC
HCT: 44 % (ref 39.0–52.0)
Hemoglobin: 15 g/dL (ref 13.0–17.0)
MCH: 32.3 pg (ref 26.0–34.0)
MCHC: 34.1 g/dL (ref 30.0–36.0)
MCV: 94.6 fL (ref 78.0–100.0)
PLATELETS: 268 10*3/uL (ref 150–400)
RBC: 4.65 MIL/uL (ref 4.22–5.81)
RDW: 13.2 % (ref 11.5–15.5)
WBC: 7.7 10*3/uL (ref 4.0–10.5)

## 2014-11-17 LAB — BASIC METABOLIC PANEL
Anion gap: 9 (ref 5–15)
BUN: 7 mg/dL (ref 6–20)
CHLORIDE: 102 mmol/L (ref 101–111)
CO2: 27 mmol/L (ref 22–32)
CREATININE: 1.09 mg/dL (ref 0.61–1.24)
Calcium: 9.7 mg/dL (ref 8.9–10.3)
GFR calc Af Amer: >60 mL/min (ref >60)
GFR calc non Af Amer: >60 mL/min (ref >60)
Glucose, Bld: 87 mg/dL (ref 65–99)
Potassium: 3.9 mmol/L (ref 3.5–5.1)
SODIUM: 138 mmol/L (ref 135–145)

## 2014-11-17 LAB — I-STAT TROPONIN, ED: Troponin i, poc: 0 ng/mL (ref 0.00–0.08)

## 2014-11-17 NOTE — ED Notes (Signed)
Patient states that he took some Benzotropin and started feeling a squeezing sensation around his heart.  He states that he is feeling short of breath with the chest pain.  No nausea or vomiting.

## 2014-11-18 ENCOUNTER — Emergency Department (HOSPITAL_COMMUNITY)
Admission: EM | Admit: 2014-11-18 | Discharge: 2014-11-18 | Disposition: A | Discharge status: Home / Self Care | Payer: Self-pay | Attending: Emergency Medicine | Admitting: Emergency Medicine

## 2014-11-18 DIAGNOSIS — R079 Chest pain, unspecified: Secondary | ICD-10-CM

## 2014-11-18 MED ORDER — OMEPRAZOLE 20 MG PO CPDR: 20.0000 mg | DELAYED_RELEASE_CAPSULE | Freq: Every day | ORAL | Status: DC

## 2014-11-18 NOTE — Discharge Instructions (Signed)
°Emergency Department Resource Guide °1) Find a Doctor and Pay Out of Pocket °Although you won't have to find out who is covered by your insurance plan, it is a good idea to ask around and get recommendations. You will then need to call the office and see if the doctor you have chosen will accept you as a new patient and what types of options they offer for patients who are self-pay. Some doctors offer discounts or will set up payment plans for their patients who do not have insurance, but you will need to ask so you aren't surprised when you get to your appointment. ° °2) Contact Your Local Health Department °Not all health departments have doctors that can see patients for sick visits, but many do, so it is worth a call to see if yours does. If you don't know where your local health department is, you can check in your phone book. The CDC also has a tool to help you locate your state's health department, and many state websites also have listings of all of their local health departments. ° °3) Find a Walk-in Clinic °If your illness is not likely to be very severe or complicated, you may want to try a walk in clinic. These are popping up all over the country in pharmacies, drugstores, and shopping centers. They're usually staffed by nurse practitioners or physician assistants that have been trained to treat common illnesses and complaints. They're usually fairly quick and inexpensive. However, if you have serious medical issues or chronic medical problems, these are probably not your best option. ° °No Primary Care Doctor: °- Call [facility] at  832-8000 - they can help you locate a primary care doctor that  accepts your insurance, provides certain services, etc. °- Physician Referral Service- 1-800-533-3463 ° °Chronic Pain Problems: °Organization         Address  Phone   Notes  °[facility]  (336) 297-2271 Patients need to be referred by their primary care doctor.  ° °Medication  Assistance: °Organization         Address  Phone   Notes  °[place] [facility] [address] °[place], [place] [address] (336) 641-8030 --Must be a resident of [place] °-- Must have NO insurance coverage whatsoever (no Medicaid/ Medicare, etc.) °-- The pt. MUST have a primary care doctor that directs their care regularly and follows them in the community °  °[facility]  (866) 331-1348   °[employer]  (888) 892-1162   ° °Agencies that provide inexpensive medical care: °Organization         Address  Phone   Notes  °[facility]  (336) 832-8035   °[facility]    (336) 832-7272   °[facility] [address] °[place], [place] [address] (336) 832-4777   °[facility] [address], °[place] (336) 271-4999   °[facility]    (336) 373-0678   °[facility]    (336) 272-1050   °[facility] ° [address], [place] Phone:  (336) 832-4444, Fax:  (336) 832-4440 Hours of Operation:  9 am - 6 pm, M-F.  Also accepts Medicaid/Medicare and self-pay.  °[facility] ° [address], [place] Phone: (336) 832-3150, Fax: (336) 832-3151. Hours of Operation:  8:30 am - 5:30 pm, M-F.  Also accepts Medicaid and self-pay.  °[facility] [place] [address], [place] Phone: (336) 878-6027   °[facility] [address], [place], [place] (336)723-1848, Ext. 123 Mondays & Thursdays: 7-9 AM.  First 15 patients are seen on a first come, first serve basis. °  ° °Medicaid-accepting [place] Providers: ° °Organization         Address  Phone   Notes  °[facility] [address], [place] (336) 641-2100 Also accepts self-pay patients.  °[facility] [address], [place] ° (336) 856-9996   °[facility] [address], [place]  (336) 288-8857   °[facility] [address] [place] [address], [place] (336) 299-7000   °Veita Bland [address], [place]  ° (336) 373-1557 Only accepts [place] Access Medicaid patients after they have their name applied to their card.  ° °Self-Pay (no insurance) in [place]: ° °Organization         Address  Phone   Notes  °Sickle Cell Patients, [facility] [address], [place] (336) 832-1970   °[facility] [address], [place] (336) 832-4400   °[facility] ° [address], [place] (336) 992-4800   °[facility]/Dr. Osei-Bonsu ° [address] [place] [address], [place] or [address], [place] (336) 841-8500 Phone number for both [place] and [place] locations is the same.  °[facility] [address], [place] (336) 299-0000   °[facility] [address] [place] [address], [place] or [address] (336) 852-7530 °(336) 878-2260   °[facility] [address], [place] (336) 350-1642, phone; (336) 294-5005, fax Sees patients 1st and 3rd Saturday of every month.  Must not qualify for public or private insurance (i.e. Medicaid, Medicare, [place] Health Choice, Veterans' Benefits) • Household income should be no more than 200% of the poverty level •The clinic cannot treat you if you are pregnant or think you are pregnant • Sexually transmitted diseases are not treated at the clinic.  ° ° °Dental Care: °Organization         Address  Phone  Notes  °[place] [facility] [address],  (336) 641-6152 Accepts children up to age 21 who are enrolled in Medicaid or [place] Health Choice; pregnant women with a Medicaid card; and children who have applied for Medicaid or [place] Health Choice, but were declined, whose parents can pay a reduced fee at time of service.  °[place]  [facility] [place]  [address], [place] (336) 641-7733 Accepts children up to age 21 who are enrolled in Medicaid or [place] Health Choice; pregnant women with a Medicaid card; and children who have applied for Medicaid or [place] Health Choice, but were declined, whose parents can pay a reduced fee at time of service.  °[facility] ° [address],  (336) 641-4533 Patients are seen by appointment only. Walk-ins are not accepted. [facility] will see patients 18 years of age and older. °Monday - Tuesday (8am-5pm) °Most Wednesdays (8:30-5pm) °$30 per visit, cash only  °[facility] ° [address], [place] (336) 641-4533 Patients are seen by appointment only. Walk-ins are not accepted. [facility] will see patients 18 years of age and older. °One   Wednesday Evening (Monthly: Volunteer Based).  $30 per visit, cash only  °[facility]  (919) 537-3737 for adults; Children under age 4, call Graduate Pediatric Dentistry at (919) [phone]. Children aged 4-14, please call (919) [phone] to request a pediatric application. ° Dental services are provided in all areas of dental care including fillings, crowns and bridges, complete and partial dentures, implants, gum treatment, root canals, and extractions. Preventive care is also provided. Treatment is provided to both adults and children. °Patients are selected via a lottery and there is often a waiting list. °  °[facility] [address], °[place] ° (336) 763-8833 [email or web address] °  °[facility] [address], [place], [place] (336)723-1848, Ext. 123 Second and Fourth Thursday of each month, opens at 6:30 AM; Clinic ends at 9 AM.  Patients are seen on a first-come first-served basis, and a limited number are seen during each clinic.  ° °[facility] ° [address], [place], [place] (336) 723-7904    Eligibility Requirements °You must have lived in [place], [place], or [place] counties for at least the last three months. °  You cannot be eligible for state or federal sponsored healthcare insurance, including Veterans Administration, Medicaid, or Medicare. °  You generally cannot be eligible for healthcare insurance through your employer.  °  How to apply: °Eligibility screenings are held every Tuesday and Wednesday afternoon from 1:00 pm until 4:00 pm. You do not need an appointment for the interview!  °[facility] [address], [place], [place] 336-631-2330   °[facility] [facility]  336-342-8273   °[facility]  336-703-3100   °[facility]  336-570-6415   ° °Behavioral Health Resources in the Community: °Intensive Outpatient Programs °Organization         Address  Phone  Notes  °[facility] [address], [place], [place] 336-878-6098   °[facility] [address], [place], [place] 336-832-9800   °[facility] [address], [place], [place] ° 336-882-2125   °[facility] [address],  °[place], [place] 1-800-853-5163 or 336-641-4981   °Substance Abuse Resources °Organization         Address  Phone  Notes  °[facility]  336-882-2125   °[facility]  336-784-9470   °[facility]  336-285-9073   °[facility]  336-845-3988   °Residential & Outpatient Substance Abuse Program  1-800-659-3381   °Psychological Services °Organization         Address  Phone  Notes  °[facility]  336- 832-9600   °[facility]  336- 378-7881   °[facility] [address], [place] 1-800-853-5163 or 336-641-4981   ° °Mobile Crisis Teams °Organization         Address  Phone  Notes  °[facility], Mobile Crisis Care Unit  1-877-626-1772   °Assertive °[facility] ° [address]  [place], [place] 336-834-9664   °Sharon DeEsch [address] °[place] 336-554-5454   ° °Self-Help/Support Groups °Organization         Address  Phone             Notes  °[facility] - variety of support groups  336- 373-1402 Call for more information  °Narcotics Anonymous (NA), [facility] [address], °[place]  2 meetings at this location  ° °  Residential Treatment Programs °Organization         Address  Phone  Notes  °[facility] [address],    °[place]  1-866-801-8205   °[facility] ° [address], [place], [place] 704-293-8524   °[facility] [address], [place] 336-845-3988 Admissions: 8am-3pm M-F  °[facility] [address]    °[place], [place] 336-841-1104   °[facility] [address], [place], [place] 336-379-7146   °[facility] [address]  °[place], [place] 336-285-9073   °[facility] - Intensive Outpatient [address], [place], [place] 336-852-3033   °[facility] [address]  °[place], [place] 1-877-615-2722 or 336-784-9470   °[facility] [address] [place], [place] 336-227-7417 Accepts Medicaid  °[facility] [address]  °[place] 1-800-659-3381 Substance Abuse/Addiction Treatment  ° °[place] Behavioral Health Resources °Organization         Address  Phone  Notes  °[facility]  (888) 581-9988   °Julie Brannon, PhD [address] [place], [place]   (336) 349-5553 or (336) 951-0000   °[facility]   [address] °[place], [place] (336) 349-4454   °[facility] [address], [place], [place] (336) 342-8316 Insurance/Medicaid/sponsorship through [facility]  °[facility] [address]                                    [place], [place] (336) 342-8316 Therapy/tele-psych/case    °[facility] [address]  ° [place], [place] (336) 349-2233    °Dr. Arfeen  (336) 349-4544   °[facility] [place]  [employer] [place] [facility] 1) [address], [place] °2) [address], [place] °3)  [address], [place] (336) 349-3220 °(336) 342-7768 ° °(336) 342-8140   °[place] [facility] (336) 342-1394 or (336) 342-3537 (After Hours)    ° ° °

## 2014-11-18 NOTE — ED Provider Notes (Signed)
CSN: [id number]     Arrival date & time 11/17/14  2221 History  By signing my name below, I, Sonum Patel, attest that this documentation has been prepared under the direction and in the presence of Azalia Bilis, MD. Electronically Signed: Sonum Patel, Neurosurgeon. 11/18/2014. 2:06 AM.    Chief Complaint  Patient presents with  . Chest Pain   The history is provided by the patient and a relative. No language interpreter was used.     HPI Comments: Shane Allen is a 29 y.o. male who presents to the Emergency Department complaining of intermittent, squeezing, left sided chest pain that began about 4-5 hours ago. He states the episodes lasts 20 seconds at a time with the last episode occuring in the waiting room. He reports taking  benzotropin over the last few days and states the pain began 30 min after taking this. He denies nausea, vomiting, fever, SOB. He is a regular 1.5 ppd smoker. He denies family history of early cardiac disease.   Past Medical History  Diagnosis Date  . Schizo affective schizophrenia   . 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   History reviewed. No pertinent past surgical history. No family history on file. Social History  Substance Use Topics  . Smoking status: Current Every Day Smoker -- 1.50 packs/day for 10 years    Types: Cigarettes  . Smokeless tobacco: Never Used  . Alcohol Use: Yes     Comment: 4 to 5 beers a day every other week for sleep    Review of Systems  Constitutional: Negative for fever.  Respiratory: Negative for cough and shortness of breath.   Cardiovascular: Positive for chest pain.  Gastrointestinal: Negative for nausea and vomiting.  All other systems reviewed and are negative.     Allergies  Haldol; Maalox; and Tylenol with codeine #3  Home Medications   Prior to Admission medications   Medication Sig Start Date End Date Taking? Authorizing Provider  amantadine  (SYMMETREL) 100 MG capsule Take 1 capsule (100 mg total) by mouth 2 (two) times daily. 10/30/14   Shari Prows, MD  LORazepam (ATIVAN) 2 MG tablet Take 1 tablet (2 mg total) by mouth at bedtime. 10/30/14   Shari Prows, MD  paliperidone (INVEGA SUSTENNA) 156 MG/ML SUSP injection Inject 1 mL (156 mg total) into the muscle once. 11/06/14   Shari Prows, MD  paliperidone (INVEGA) 6 MG 24 hr tablet Take 1 tablet (6 mg total) by mouth at bedtime. 10/30/14   Jolanta B Pucilowska, MD   BP 118/73 mmHg  Pulse 79  Temp(Src) 97.5 F (36.4 C) (Oral)  Resp 17  Ht  (1.753 m)  Wt 220 lb (99.791 kg)  BMI 32.47 kg/m2  SpO2 97% Physical Exam  Constitutional: He is oriented to person, place, and time. He appears well-developed and well-nourished.  HENT:  Head: Normocephalic and atraumatic.  Eyes: EOM are normal.  Neck: Normal range of motion.  Cardiovascular: Normal rate, regular rhythm, normal heart sounds and intact distal pulses.   Pulmonary/Chest: Effort normal and breath sounds normal. No respiratory distress.  Abdominal: Soft. He exhibits no distension. There is no tenderness.  Musculoskeletal: Normal range of motion.  Neurological: He is alert and oriented to person, place, and time.  Skin: Skin is warm and dry.  Psychiatric: He has a normal mood and affect. Judgment normal.  Nursing note and vitals reviewed.  ED Course  Procedures (including critical care time)  DIAGNOSTIC STUDIES: Oxygen Saturation is 97% on RA, adequate by my interpretation.    COORDINATION OF CARE: 2:14 AM Discussed treatment plan with pt at bedside and pt agreed to plan.   Labs Review Labs Reviewed  BASIC METABOLIC PANEL  CBC  I-STAT TROPOININ, ED    Imaging Review Dg Chest 2 View  11/17/2014   CLINICAL DATA:  Right-sided chest pain, nausea, shortness of breath, right neck pain.  EXAM: CHEST  2 VIEW  COMPARISON:  12/28/2012  FINDINGS: The heart size and mediastinal contours are within  normal limits. Both lungs are clear. The visualized skeletal structures are unremarkable.  IMPRESSION: No active cardiopulmonary disease.   Electronically Signed   By: Burman Nieves M.D.   On: 11/17/2014 23:30   I have personally reviewed and evaluated these images and lab results as part of my medical decision-making.   EKG Interpretation   Date/Time:  Saturday November 17 2014 22:28:39 EDT Ventricular Rate:  85 PR Interval:  138 QRS Duration: 94 QT Interval:  354 QTC Calculation: 421 R Axis:   71 Text Interpretation:  Normal sinus rhythm Normal ECG No significant change  was found Confirmed by CAMPOS  MD, KEVIN ([id number]) on 11/18/2014 2:12:36 AM      MDM   Final diagnoses:  Chest pain, unspecified chest pain type    Patient is overall well-appearing.  Doubt pulmonary wasn't.  Doubt acute coronary syndrome.  EKG and troponin are without abnormal value.  Discharge home in good condition.  Patient will be given information regarding primary care physicians in the community.  He will continue [facility] with [facility] for his mental health.  I personally performed the services described in this documentation, which was scribed in my presence. The recorded information has been reviewed and is accurate.      Azalia Bilis, MD 11/18/14 [phone]

## 2014-11-30 ENCOUNTER — Emergency Department (HOSPITAL_COMMUNITY)
Admission: EM | Admit: 2014-11-30 | Discharge: 2014-11-30 | Discharge status: Against Medical Advice | Payer: Self-pay | Attending: Emergency Medicine | Admitting: Emergency Medicine

## 2014-11-30 DIAGNOSIS — Z720 Tobacco use: Secondary | ICD-10-CM | POA: Insufficient documentation

## 2014-11-30 DIAGNOSIS — F419 Anxiety disorder, unspecified: Secondary | ICD-10-CM | POA: Insufficient documentation

## 2014-11-30 NOTE — ED Notes (Signed)
Pt BIB EMS. Pt has been taking Invega injections. Has been taking Cogentin for the past 1 1/2 weeks for EPS. Pt states he didn't take his dose of Cogentin tonight and now is anxious. He is complaining of shortness of breath. He also states he has had R sided chest pain for 2 months. EMS reports patient to calm, cooperative. Denies SI/HI. Denies visual/auditory hallucinations.

## 2014-11-30 NOTE — ED Notes (Addendum)
Pt has decided to leave AMA with mother. Pt denies SI or HI. They report that they will just go home and he can take his medication. Pt calm and cooperative. NAD noted.

## 2015-08-17 ENCOUNTER — Emergency Department
Admission: EM | Admit: 2015-08-17 | Discharge: 2015-08-17 | Payer: Self-pay | Attending: Emergency Medicine | Admitting: Emergency Medicine

## 2015-08-17 ENCOUNTER — Inpatient Hospital Stay
Admission: AD | Admit: 2015-08-17 | Discharge: 2015-08-22 | DRG: 885 | Disposition: A | Discharge status: Home / Self Care | Payer: No Typology Code available for payment source | Source: Ambulatory Visit | Attending: Psychiatry | Admitting: Psychiatry

## 2015-08-17 ENCOUNTER — Encounter: Payer: Self-pay | Admitting: Emergency Medicine

## 2015-08-17 DIAGNOSIS — Z79899 Other long term (current) drug therapy: Secondary | ICD-10-CM | POA: Insufficient documentation

## 2015-08-17 DIAGNOSIS — Z888 Allergy status to other drugs, medicaments and biological substances status: Secondary | ICD-10-CM | POA: Diagnosis not present

## 2015-08-17 DIAGNOSIS — F17200 Nicotine dependence, unspecified, uncomplicated: Secondary | ICD-10-CM | POA: Diagnosis present

## 2015-08-17 DIAGNOSIS — G4700 Insomnia, unspecified: Secondary | ICD-10-CM | POA: Diagnosis present

## 2015-08-17 DIAGNOSIS — F259 Schizoaffective disorder, unspecified: Secondary | ICD-10-CM | POA: Diagnosis present

## 2015-08-17 DIAGNOSIS — F17210 Nicotine dependence, cigarettes, uncomplicated: Secondary | ICD-10-CM | POA: Diagnosis present

## 2015-08-17 DIAGNOSIS — F203 Undifferentiated schizophrenia: Secondary | ICD-10-CM | POA: Diagnosis present

## 2015-08-17 DIAGNOSIS — F209 Schizophrenia, unspecified: Secondary | ICD-10-CM | POA: Insufficient documentation

## 2015-08-17 DIAGNOSIS — E221 Hyperprolactinemia: Secondary | ICD-10-CM | POA: Diagnosis present

## 2015-08-17 DIAGNOSIS — Z9114 Patient's other noncompliance with medication regimen: Secondary | ICD-10-CM | POA: Insufficient documentation

## 2015-08-17 LAB — ACETAMINOPHEN LEVEL: Acetaminophen (Tylenol), Serum: <10 ug/mL — ABNORMAL LOW (ref 10–30)

## 2015-08-17 LAB — CBC WITH DIFFERENTIAL/PLATELET
Basophils Absolute: 0 10*3/uL (ref 0–0.1)
Basophils Relative: 1 %
EOS ABS: 0.1 10*3/uL (ref 0–0.7)
Eosinophils Relative: 1 %
HCT: 48.1 % (ref 40.0–52.0)
HEMOGLOBIN: 16.5 g/dL (ref 13.0–18.0)
Lymphocytes Relative: 21 %
Lymphs Abs: 2.1 10*3/uL (ref 1.0–3.6)
MCH: 32.3 pg (ref 26.0–34.0)
MCHC: 34.3 g/dL (ref 32.0–36.0)
MCV: 94.1 fL (ref 80.0–100.0)
Monocytes Absolute: 1.1 10*3/uL — ABNORMAL HIGH (ref 0.2–1.0)
Monocytes Relative: 11 %
NEUTROS PCT: 66 %
Neutro Abs: 6.8 10*3/uL — ABNORMAL HIGH (ref 1.4–6.5)
PLATELETS: 310 10*3/uL (ref 150–440)
RBC: 5.12 MIL/uL (ref 4.40–5.90)
RDW: 13.6 % (ref 11.5–14.5)
WBC: 10.1 10*3/uL (ref 3.8–10.6)

## 2015-08-17 LAB — URINE DRUG SCREEN, QUALITATIVE (ARMC ONLY)
Amphetamines, Ur Screen: NOT DETECTED
BARBITURATES, UR SCREEN: NOT DETECTED
Benzodiazepine, Ur Scrn: NOT DETECTED
CANNABINOID 50 NG, UR ~~LOC~~: NOT DETECTED
Cocaine Metabolite,Ur ~~LOC~~: NOT DETECTED
MDMA (ECSTASY) UR SCREEN: NOT DETECTED
Methadone Scn, Ur: NOT DETECTED
Opiate, Ur Screen: NOT DETECTED
Phencyclidine (PCP) Ur S: NOT DETECTED
Tricyclic, Ur Screen: NOT DETECTED

## 2015-08-17 LAB — COMPREHENSIVE METABOLIC PANEL
ALK PHOS: 68 U/L (ref 38–126)
ALT: 31 U/L (ref 17–63)
ANION GAP: 10 (ref 5–15)
AST: 24 U/L (ref 15–41)
Albumin: 5.3 g/dL — ABNORMAL HIGH (ref 3.5–5.0)
BILIRUBIN TOTAL: 0.6 mg/dL (ref 0.3–1.2)
BUN: 13 mg/dL (ref 6–20)
CALCIUM: 10.1 mg/dL (ref 8.9–10.3)
CO2: 22 mmol/L (ref 22–32)
Chloride: 106 mmol/L (ref 101–111)
Creatinine, Ser: 1.06 mg/dL (ref 0.61–1.24)
GFR calc non Af Amer: >60 mL/min (ref >60)
Glucose, Bld: 92 mg/dL (ref 65–99)
Potassium: 4.1 mmol/L (ref 3.5–5.1)
SODIUM: 138 mmol/L (ref 135–145)
TOTAL PROTEIN: 8.6 g/dL — AB (ref 6.5–8.1)

## 2015-08-17 LAB — ETHANOL: Alcohol, Ethyl (B): <5 mg/dL (ref <5)

## 2015-08-17 LAB — URINALYSIS COMPLETE WITH MICROSCOPIC (ARMC ONLY)
BILIRUBIN URINE: NEGATIVE
Bacteria, UA: NONE SEEN
Glucose, UA: NEGATIVE mg/dL
Hgb urine dipstick: NEGATIVE
KETONES UR: NEGATIVE mg/dL
Leukocytes, UA: NEGATIVE
NITRITE: NEGATIVE
Protein, ur: 30 mg/dL — AB
Specific Gravity, Urine: 1.02 (ref 1.005–1.030)
Squamous Epithelial / LPF: NONE SEEN
pH: 5 (ref 5.0–8.0)

## 2015-08-17 LAB — SALICYLATE LEVEL: Salicylate Lvl: <4 mg/dL (ref 2.8–30.0)

## 2015-08-17 MED ORDER — NICOTINE 21 MG/24HR TD PT24
21.0000 mg | MEDICATED_PATCH | Freq: Every day | TRANSDERMAL | Status: DC
  Administered 2015-08-18 – 2015-08-22 (×5): 21 mg via TRANSDERMAL
  Filled 2015-08-17 (×6): qty 1

## 2015-08-17 MED ORDER — IBUPROFEN 600 MG PO TABS: 600.0000 mg | ORAL_TABLET | Freq: Four times a day (QID) | ORAL | Status: DC | PRN

## 2015-08-17 MED ORDER — PALIPERIDONE PALMITATE 234 MG/1.5ML IM SUSP: 234.0000 mg | INTRAMUSCULAR | Status: DC

## 2015-08-17 MED ORDER — PALIPERIDONE ER 3 MG PO TB24
9.0000 mg | ORAL_TABLET | Freq: Every day | ORAL | Status: DC
  Administered 2015-08-18 – 2015-08-19 (×2): 9 mg via ORAL
  Filled 2015-08-17 (×2): qty 3

## 2015-08-17 MED ORDER — LORAZEPAM 1 MG PO TABS
1.0000 mg | ORAL_TABLET | ORAL | Status: DC | PRN
  Administered 2015-08-18 (×2): 1 mg via ORAL
  Filled 2015-08-17 (×2): qty 1

## 2015-08-17 MED ORDER — MAGNESIUM HYDROXIDE 400 MG/5ML PO SUSP: 30.0000 mL | Freq: Every day | ORAL | Status: DC | PRN

## 2015-08-17 NOTE — ED Notes (Signed)
States voices in his head are bothering him. Denies SI or HI.

## 2015-08-17 NOTE — ED Notes (Signed)
Pt. Alert and oriented, warm and dry, in no distress. Pt. Denies SI, HI, and AH. Pt reports having some Visual hallucinations but un able to describe. Patient made aware of transfer to BMU.  Pt. Encouraged to let nursing staff know of any concerns or needs.

## 2015-08-17 NOTE — ED Notes (Signed)
Patient is taking a shower. ,, reported off to oncoming nurses.

## 2015-08-17 NOTE — ED Notes (Signed)
[facility]

## 2015-08-17 NOTE — Progress Notes (Signed)
Patient ID: [name], male   DOB: 09/24/1985, 30 y.o.   MRN: [id number]  [facility] He is alert and oriented x4. Pt states "I've been having hallucinations and hearing voices." Pt denies hallucinations at this time, but reports "if I get in a certain room I do." Pt describes the voices as "demonic" and "if I look at something it's got a shakiness to it." Pt reports that he has not slept in 3 days. Pt affect is flat. He is cautious with assessment questions. Denies SI/HI at this time. Denies pain. Pt reports that [facility] has [facility], where he is getting weaned off of his prescribed Invega due to "a hard heartbeat after I take it." Evening dose of invega held per MD orders. Pt reports that while here he would "like to work on my social skills." Skin assessment performed and no contraband found. Skin is warm and dry to touch. No skin abnormalities noted. Pt oriented to unit. No questions or concerns at this time. q15 minute safety checks maintained. Pt remains free from harm. Will continue to monitor.

## 2015-08-17 NOTE — ED Provider Notes (Signed)
[facility] Emergency Department Provider Note  ____________________________________________  Time seen: Approximately 345 PM  I have reviewed the triage vital signs and the nursing notes.   HISTORY  Chief Complaint Psychiatric Evaluation   HPI Shane Allen is a 30 y.o. male with a history of schizophrenia who is returning to the emergency department today with worsening voices in his head which she says are bothering him. He says they're sounding more demonic. He says that he stopped his medication several months ago because he thought he could "cope with it" without his meds. He is denying any suicidal or homicidal ideation. Denies any drug use or drinking.   Past Medical History  Diagnosis Date  . Schizo affective schizophrenia (HCC)   . 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    Patient Active Problem List   Diagnosis Date Noted  . Schizophrenia, unspecified type (HCC) 10/27/2014  . Tobacco use disorder 10/27/2014    History reviewed. No pertinent past surgical history.  Current Outpatient Rx  Name  Route  Sig  Dispense  Refill  . amantadine (SYMMETREL) 100 MG capsule   Oral   Take 1 capsule (100 mg total) by mouth 2 (two) times daily.   60 capsule   0   . LORazepam (ATIVAN) 2 MG tablet   Oral   Take 1 tablet (2 mg total) by mouth at bedtime.   30 tablet   0   . omeprazole (PRILOSEC) 20 MG capsule   Oral   Take 1 capsule (20 mg total) by mouth daily.   30 capsule   0   . paliperidone (INVEGA SUSTENNA) 156 MG/ML SUSP injection   Intramuscular   Inject 1 mL (156 mg total) into the muscle once.   0.9 mL   0     First injection on 11/06/2014.   . paliperidone (INVEGA) 6 MG 24 hr tablet   Oral   Take 1 tablet (6 mg total) by mouth at bedtime.   30 tablet   0     Allergies Haldol; Maalox; and Tylenol with codeine #3  History reviewed. No pertinent family  history.  Social History Social History  Substance Use Topics  . Smoking status: Current Every Day Smoker -- 1.00 packs/day for 10 years    Types: Cigarettes  . Smokeless tobacco: Never Used  . Alcohol Use: Yes     Comment: 4 to 5 beers a day every other week for sleep    Review of Systems Constitutional: No fever/chills Eyes: No visual changes. ENT: No sore throat. Cardiovascular: Denies chest pain. Respiratory: Denies shortness of breath. Gastrointestinal: No abdominal pain.  No nausea, no vomiting.  No diarrhea.  No constipation. Genitourinary: Negative for dysuria. Musculoskeletal: Negative for back pain. Skin: Negative for rash. Neurological: Negative for headaches, focal weakness or numbness.  10-point ROS otherwise negative.  ____________________________________________   PHYSICAL EXAM:  VITAL SIGNS: ED Triage Vitals  Enc Vitals Group     BP 08/17/15 1422 133/95 mmHg     Pulse Rate 08/17/15 1422 115     Resp 08/17/15 1422 20     Temp 08/17/15 1422 98.5 F (36.9 C)     Temp Source 08/17/15 1422 Oral     SpO2 08/17/15 1422 98 %     Weight 08/17/15 1422 267 lb (121.11 kg)     Height 08/17/15 1422 5\' 9"  (1.753 m)  Head Cir --      Peak Flow --      Pain Score 08/17/15 1423 7     Pain Loc --      Pain Edu? --      Excl. in GC? --     Constitutional: Alert and oriented. Disheveled but in no acute distress. Eyes: Conjunctivae are normal. PERRL. EOMI. Head: Atraumatic. Nose: No congestion/rhinnorhea. Mouth/Throat: Mucous membranes are moist.  Neck: No stridor.   Cardiovascular: Normal rate, regular rhythm. Grossly normal heart sounds.  Respiratory: Normal respiratory effort.  No retractions. Lungs CTAB. Gastrointestinal: Soft and nontender. No distention.  Musculoskeletal: No lower extremity tenderness nor edema.  No joint effusions. Neurologic:  Normal speech and language. No gross focal neurologic deficits are appreciated.  Skin:  Skin is warm, dry  and intact. No rash noted. Psychiatric: Mood and affect are normal. Speech and behavior are normal.  ____________________________________________   LABS (all labs ordered are listed, but only abnormal results are displayed)  Labs Reviewed  COMPREHENSIVE METABOLIC PANEL - Abnormal; Notable for the following:    Total Protein 8.6 (*)    Albumin 5.3 (*)    All other components within normal limits  CBC WITH DIFFERENTIAL/PLATELET - Abnormal; Notable for the following:    Neutro Abs 6.8 (*)    Monocytes Absolute 1.1 (*)    All other components within normal limits  URINALYSIS COMPLETEWITH MICROSCOPIC ([facility] ONLY) - Abnormal; Notable for the following:    Color, Urine YELLOW (*)    APPearance CLEAR (*)    Protein, ur 30 (*)    All other components within normal limits  ACETAMINOPHEN LEVEL - Abnormal; Notable for the following:    Acetaminophen (Tylenol), Serum <10 (*)    All other components within normal limits  ETHANOL  URINE DRUG SCREEN, QUALITATIVE ([facility] ONLY)  SALICYLATE LEVEL   ____________________________________________  EKG   ____________________________________________  RADIOLOGY   ____________________________________________   PROCEDURES   ____________________________________________   INITIAL IMPRESSION / ASSESSMENT AND PLAN / ED COURSE  Pertinent labs & imaging results that were available during my care of the patient were reviewed by me and considered in my medical decision making (see chart for details).  ----------------------------------------- 5:01 PM on 08/17/2015 -----------------------------------------  Discussed with behavioral health intake, Calvin, says that he discussed case with psychiatrist the patient will be admitted to the hospital to psychiatric unit. Patient under involuntary commitment and I made him aware of this need. He is understanding of the plan and willing to comply. He was saying that he is expecting to be  admitted. ____________________________________________   FINAL CLINICAL IMPRESSION(S) / ED DIAGNOSES  Schizophrenia. Medication noncompliance.    NEW MEDICATIONS STARTED DURING THIS VISIT:  New Prescriptions   No medications on file     Note:  This document was prepared using Dragon voice recognition software and may include unintentional dictation errors.    Myrna Blazeravid Matthew Schaevitz, MD 08/17/15 [phone]

## 2015-08-17 NOTE — Tx Team (Signed)
Initial Interdisciplinary Treatment Plan   PATIENT STRESSORS: Financial difficulties Medication change or noncompliance   PATIENT STRENGTHS: General fund of knowledge Physical Health Supportive family/friends   PROBLEM LIST: Problem List/Patient Goals Date to be addressed Date deferred Reason deferred Estimated date of resolution  Psychosis 08/17/15     Medication noncompliance 08/17/15           "I would like to work on my social skills" 08/17/15                                    DISCHARGE CRITERIA:  Improved stabilization in mood, thinking, and/or behavior Motivation to continue treatment in a less acute level of care Need for constant or close observation no longer present Verbal commitment to aftercare and medication compliance  PRELIMINARY DISCHARGE PLAN: Attend aftercare/continuing care group Outpatient therapy Return to previous living arrangement  PATIENT/FAMIILY INVOLVEMENT: This treatment plan has been presented to and reviewed with the patient, Shane Allen, and/or family member.  The patient and family have been given the opportunity to ask questions and make suggestions.  Shane Allen 08/17/2015, 8:51 PM

## 2015-08-17 NOTE — BH Assessment (Signed)
Assessment Completed.  [facility] attending Physician Jennet Maduro(Pucilowska) who recommends Psychiatric Inpatient Treatment.  ER MD Karl Bales(Schaevtiz) informed of this decision.  [facility] Attending Physician/Psycharist (Pucilowska) and will put in Admission Orders.   Admissions: [facility] by Dr. Jennet MaduroPucilowska.  Attending Physician will be Dr. Jennet MaduroPucilowska.   Patient has been [facility] to room 307[facility]A, [facility] Nurse [place].   Intake Paper Work has been signed and placed on patient chart.  ER staff is aware of the admission [facility](Tracey, ER Sect.; Dr. Karl BalesSchaevtiz, ER MD; Victorino DikeJennifer, Patient's Nurse & Marliss CzarLeigh, Patient Access).

## 2015-08-17 NOTE — BH Assessment (Signed)
Assessment Note  Shane Allen is an 30 y.o. male who presents to the ER due to increase of A/H. He states the voices are demonic and mean. Per his report, the voices are not command in nature. He stopped tSheppard Evensaking his psychotropic medications "3 or 4 weeks ago." He didn't like the way they made him feel. He was being prescribed 156mg /IM once every four weeks.  He's currently [facility] in [place], [place] He receives medication management and sees a therapist once a month.  During the interview, the patient was calm and cooperative. He was fixated on the voices being demonic and how they leave his body, at night when he sleeps. Patient had a body odor and admits that he hasn't' taking a bath in 7 days. He was in agreement with taking a bath because, "I smell like sin."  He denies SI/HI and V/H.   Diagnosis: Schizophrenia  Past Medical History:  Past Medical History  Diagnosis Date  . Schizo affective schizophrenia (HCC)   . 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    History reviewed. No pertinent past surgical history.  Family History: No family history on file.  Social History:  reports that he has been smoking Cigarettes.  He has a 10 pack-year smoking history. He has never used smokeless tobacco. He reports that he drinks alcohol. He reports that he does not use illicit drugs.  Additional Social History:     CIWA: CIWA-Ar BP: (!) 133/95 mmHg Pulse Rate: (!) 115 COWS:    Allergies:  Allergies  Allergen Reactions  . Haldol [Haloperidol Lactate] Other (See Comments)    Uncontrolled muscle movement  . Maalox [Calcium Carbonate Antacid] Other (See Comments)    Thinks this medication makes his face droop, but it could be another medication.  . Tylenol With Codeine #3 [Acetaminophen-Codeine] Palpitations    Home Medications:  (Not in a hospital admission)  OB/GYN  Status:  No LMP for male patient.  General Assessment Data [facility] Assessment: In system Is this a Tele or Face-to-Face Assessment?: Face-to-Face Is this an Initial Assessment or a Re-assessment for this encounter?: Initial Assessment Marital status: Single Maiden name: n/a Is patient pregnant?: No Pregnancy Status: No Living Arrangements: Parent Can pt return to current living arrangement?: Yes Admission Status: Voluntary Is patient capable of signing voluntary admission?: Yes Referral Source: Self/Family/Friend Insurance type: None  Medical Screening Exam [facility] Walk-in ONLY) Medical Exam completed: Yes  Crisis Care Plan Living Arrangements: Parent Legal Guardian: Other: (None) [facility]  Education Status Is patient currently in school?: No Current Grade: n/a Highest grade of school patient has completed: 7th Grade Name of school: n/a Contact person: n/a  Risk to self with the past 6 months Suicidal Ideation: No Has patient been a risk to self within the past 6 months prior to admission? : No Suicidal Intent: No Has patient had any suicidal intent within the past 6 months prior to admission? : No Is patient at risk for suicide?: No Suicidal Plan?: No Has patient had any suicidal plan within the past 6 months prior to admission? : No Access to Means: No What has been your use of drugs/alcohol within the last 12 months?: Reports of none Previous Attempts/Gestures: No How many times?: 0 Other Self Harm Risks: Reports of none Triggers for Past  Attempts: None known Intentional Self Injurious Behavior: None Family Suicide History: No Recent stressful life event(s): Other (Comment) (Stop taking medications) Persecutory voices/beliefs?: Yes Depression: Yes Depression Symptoms: Feeling worthless/self pity, Loss of interest in usual pleasures, Guilt, Fatigue,  Isolating Substance abuse history and/or treatment for substance abuse?: No Suicide prevention information given to non-admitted patients: Not applicable  Risk to Others within the past 6 months Homicidal Ideation: No Does patient have any lifetime risk of violence toward others beyond the six months prior to admission? : No Thoughts of Harm to Others: No Current Homicidal Intent: No Current Homicidal Plan: No Access to Homicidal Means: No Identified Victim: Reports of none History of harm to others?: No Assessment of Violence: None Noted Violent Behavior Description: Reports of none Does patient have access to weapons?: No Criminal Charges Pending?: No Does patient have a court date: Yes Court Date: 08/22/15 [facility](Seat Belt Violation & DWLR Not Impaired) Is patient on probation?: No  Psychosis Hallucinations: Auditory, Olfactory Delusions:  (Religious)  Mental Status Report Appearance/Hygiene: In scrubs, Unremarkable, In hospital gown Eye Contact: Fair Motor Activity: Freedom of movement Speech: Slow, Slurred Level of Consciousness: Alert Mood: Helpless Affect: Flat Anxiety Level: Minimal Thought Processes: Relevant, Thought Blocking Judgement: Impaired Orientation: Person, Place, Situation, Appropriate for developmental age, Time Obsessive Compulsive Thoughts/Behaviors: Moderate  Cognitive Functioning Concentration: Decreased Memory: Remote Intact, Recent Intact IQ: Average Insight: Poor Impulse Control: Poor Appetite: Fair Weight Loss: 0 Weight Gain: 0 Sleep: Decreased Total Hours of Sleep: 0 ("I haven't slept in 3 days.") Vegetative Symptoms: None  ADLScreening [facility] Assessment Services) Patient's cognitive ability adequate to safely complete daily activities?: Yes Patient able to express need for assistance with ADLs?: Yes Independently performs ADLs?: Yes (appropriate for developmental age)  Prior Inpatient Therapy Prior Inpatient Therapy: Yes Prior Therapy  Dates: 10/2014 & 06/2012 Prior Therapy Facilty/Provider(s): [facility], [facility] Reason for Treatment: Schizophrenia  Prior Outpatient Therapy Prior Outpatient Therapy: Yes Prior Therapy Dates: Current Prior Therapy [facility] Reason for Treatment: Schizophrenia Does patient have an ACCT team?: No Does patient have Intensive In-House Services?  : No Does patient have [facility] services? : Yes Does patient have P4CC services?: No  ADL Screening (condition at time of admission) Patient's cognitive ability adequate to safely complete daily activities?: Yes Is the patient deaf or have difficulty hearing?: No Does the patient have difficulty seeing, even when wearing glasses/contacts?: No Does the patient have difficulty concentrating, remembering, or making decisions?: No Patient able to express need for assistance with ADLs?: Yes Does the patient have difficulty dressing or bathing?: No Independently performs ADLs?: Yes (appropriate for developmental age) Does the patient have difficulty walking or climbing stairs?: No Weakness of Legs: None Weakness of Arms/Hands: None  Home Assistive Devices/Equipment Home Assistive Devices/Equipment: None  Therapy Consults (therapy consults require a physician order) PT Evaluation Needed: No OT Evalulation Needed: No SLP Evaluation Needed: No Abuse/Neglect Assessment (Assessment to be complete while patient is alone) Physical Abuse: Denies Verbal Abuse: Denies Sexual Abuse: Denies Exploitation of patient/patient's resources: Denies Self-Neglect: Denies Values / Beliefs Cultural Requests During Hospitalization: None Spiritual Requests During Hospitalization: None Consults Spiritual Care Consult Needed: No Social Work Consult Needed: No Merchant navy officerAdvance Directives (For Healthcare) Does patient have an advance directive?: No Would patient like information on creating an advanced directive?: No -  patient declined information    Additional Information 1:1 In Past 12 Months?: No CIRT Risk: No Elopement Risk: No Does patient have medical clearance?:  Yes  Child/Adolescent Assessment Running Away Risk: Denies (Patient is an adult)  Disposition:  Disposition Initial Assessment Completed for this Encounter: Yes  On Site Evaluation by:   Reviewed with Physician:    Lilyan Gilford MS, LCAS, LPC, NCC, CCSI Therapeutic Triage Specialist 08/17/2015 4:26 PM

## 2015-08-17 NOTE — ED Notes (Signed)
Nurse talked with patient, Patient is cooperative, nurse ask him if He was hearing voices and He states that He did hear demon sounds and it was evil, but He cannot make out what they are saying and He denies Si/HI, Patient states it all started about 4 years ago, and that He has been in this battle ongoing and the medicine does help, but voices do not stop completely, He also states that He prays a lot and that is the only way He gets thru this, patient states that His family is supportive. q 15 miin. Checks and camera surveillance in progress.

## 2015-08-18 DIAGNOSIS — F209 Schizophrenia, unspecified: Principal | ICD-10-CM

## 2015-08-18 LAB — LIPID PANEL
Cholesterol: 258 mg/dL — ABNORMAL HIGH (ref 0–200)
HDL: 36 mg/dL — ABNORMAL LOW (ref >40)
LDL CALC: 198 mg/dL — AB (ref 0–99)
TRIGLYCERIDES: 118 mg/dL (ref <150)
Total CHOL/HDL Ratio: 7.2 RATIO
VLDL: 24 mg/dL (ref 0–40)

## 2015-08-18 LAB — HEMOGLOBIN A1C: Hgb A1c MFr Bld: 5.1 % (ref 4.0–6.0)

## 2015-08-18 LAB — TSH: TSH: 0.943 u[IU]/mL (ref 0.350–4.500)

## 2015-08-18 MED ORDER — PALIPERIDONE PALMITATE 234 MG/1.5ML IM SUSP
234.0000 mg | INTRAMUSCULAR | Status: DC
  Administered 2015-08-18: 234 mg via INTRAMUSCULAR
  Filled 2015-08-18: qty 1.5

## 2015-08-18 MED ORDER — AMANTADINE HCL 100 MG PO CAPS
100.0000 mg | ORAL_CAPSULE | Freq: Two times a day (BID) | ORAL | Status: DC
  Administered 2015-08-18 – 2015-08-20 (×5): 100 mg via ORAL
  Filled 2015-08-18 (×7): qty 1

## 2015-08-18 MED ORDER — TEMAZEPAM 15 MG PO CAPS
15.0000 mg | ORAL_CAPSULE | Freq: Every day | ORAL | Status: DC
  Administered 2015-08-18 – 2015-08-21 (×4): 15 mg via ORAL
  Filled 2015-08-18 (×4): qty 1
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
## 2015-08-18 NOTE — H&P (Signed)
Psychiatric Admission Assessment Adult  Patient Identification: Shane Allen MRN:  [id number] Date of Evaluation:  08/18/2015 Chief Complaint:  schizophernia Principal Diagnosis: Schizophrenia, unspecified ([place]) Diagnosis:   Patient Active Problem List   Diagnosis Date Noted  . Schizophrenia, unspecified ([place]) [F20.9] 08/17/2015  . Tobacco use disorder [F17.200] 10/27/2014   History of Present Illness:   Identifying data. Shane Allen is a 30 year old male with a history of schizophrenia.  Chief complaint. "I don't know."  History of present illness. Information was obtained from the patient and the chart the patient has a history of psychosis with his first psychotic break in 2014. He was hospitalized at [facility] in April 2016 and discharged on [place] injections. 4 months ago he stopped injections. Reportedly his psychiatrist at [facility] agreed to try him on a different medication but apparently the patient has not taken any. He reports that he was doing well for a while but for the past week he started experiencing loud, derogatory, frightening voices of demons. He also has some visual hallucinations. His thinking is disorganized and the patient has difficulties answering questions about his mood, anxiety, or medication history. There are no symptoms suggestive of bipolar mania. The patient has not been using alcohol or substances.  Past psychiatric history. First hospitalization month [facility] in 2014. He was [facility] [place] [facility] in [facility]. He did not respond to Abilify, and dystonic reaction to Haldol, and did well on Invega. Amitriptyline was added to his regimen due to some jerking. He reportedly did well on Navane at some point. He follows up at [facility] [place].  Family psychiatric history. None reported.  Social history. He lives with his mother. He is unemployed. He does not receive  disability. He does not have health insurance.  Total Time spent with patient: 1 hour  Is the patient at risk to self? No.  Has the patient been a risk to self in the past 6 months? No.  Has the patient been a risk to self within the distant past? No.  Is the patient a risk to others? No.  Has the patient been a risk to others in the past 6 months? No.  Has the patient been a risk to others within the distant past? No.   Prior Inpatient Therapy:   Prior Outpatient Therapy:    Alcohol Screening: 1. How often do you have a drink containing alcohol?: Never 2. How many drinks containing alcohol do you have on a typical day when you are drinking?: 1 or 2 3. How often do you have six or more drinks on one occasion?: Never Preliminary Score: 0 4. How often during the last year have you found that you were not able to stop drinking once you had started?: Never 5. How often during the last year have you failed to do what was normally expected from you becasue of drinking?: Never 6. How often during the last year have you needed a first drink in the morning to get yourself going after a heavy drinking session?: Never 7. How often during the last year have you had a feeling of guilt of remorse after drinking?: Never 8. How often during the last year have you been unable to remember what happened the night before because you had been drinking?: Never 9. Have you or someone else been injured as a result of your drinking?: No 10. Has a relative or friend or a doctor or another  health worker been concerned about your drinking or suggested you cut down?: No Alcohol Use Disorder Identification Test Final Score (AUDIT): 0 Brief Intervention: AUDIT score less than 7 or less-screening does not suggest unhealthy drinking-brief intervention not indicated Substance Abuse History in the last 12 months:  No. Consequences of Substance Abuse: NA Previous Psychotropic Medications: Yes  Psychological Evaluations:  No  Past Medical History:  Past Medical History  Diagnosis Date  . Schizo affective schizophrenia ([place])   . 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    Past Surgical History  Procedure Laterality Date  . No past surgeries     Family History: History reviewed. No pertinent family history.  Tobacco Screening: _0 [phone])::1)@ Social History:  History  Alcohol Use No    Comment: 4 to 5 beers a day every other week for sleep     History  Drug Use No    Additional Social History:      Pain Medications: denies Prescriptions: see PTA meds Over the Counter: denies History of alcohol / drug use?: No history of alcohol / drug abuse                    Allergies:   Allergies  Allergen Reactions  . Haldol [Haloperidol Lactate] Other (See Comments)    Uncontrolled muscle movement  . Maalox [Calcium Carbonate Antacid] Other (See Comments)    Thinks this medication makes his face droop, but it could be another medication.  . Tylenol With Codeine #3 [Acetaminophen-Codeine] Palpitations   Lab Results:  Results for orders placed or performed during the hospital encounter of 08/17/15 (from the past 48 hour(s))  Comprehensive metabolic panel     Status: Abnormal   Collection Time: 08/17/15  2:35 PM  Result Value Ref Range   Sodium 138 135 - 145 mmol/L   Potassium 4.1 3.5 - 5.1 mmol/L   Chloride 106 101 - 111 mmol/L   CO2 22 22 - 32 mmol/L   Glucose, Bld 92 65 - 99 mg/dL   BUN 13 6 - 20 mg/dL   Creatinine, Ser 1.06 0.61 - 1.24 mg/dL   Calcium 10.1 8.9 - 10.3 mg/dL   Total Protein 8.6 (H) 6.5 - 8.1 g/dL   Albumin 5.3 (H) 3.5 - 5.0 g/dL   AST 24 15 - 41 U/L   ALT 31 17 - 63 U/L   Alkaline Phosphatase 68 38 - 126 U/L   Total Bilirubin 0.6 0.3 - 1.2 mg/dL   GFR calc non Af Amer >60 >60 mL/min   GFR calc Af Amer >60 >60 mL/min    Comment: (NOTE) The eGFR has been calculated using the CKD EPI equation. This  calculation has not been validated in all clinical situations. eGFR's persistently <60 mL/min signify possible Chronic Kidney Disease.    Anion gap 10 5 - 15  Ethanol     Status: None   Collection Time: 08/17/15  2:35 PM  Result Value Ref Range   Alcohol, Ethyl (B) <5 <5 mg/dL    Comment:        LOWEST DETECTABLE LIMIT FOR SERUM ALCOHOL IS 5 mg/dL FOR MEDICAL PURPOSES ONLY   CBC with Diff     Status: Abnormal   Collection Time: 08/17/15  2:35 PM  Result Value Ref Range   WBC 10.1 3.8 - 10.6 K/uL   RBC 5.12 4.40 - 5.90 MIL/uL   Hemoglobin 16.5 13.0 - 18.0  g/dL   HCT 48.1 40.0 - 52.0 %   MCV 94.1 80.0 - 100.0 fL   MCH 32.3 26.0 - 34.0 pg   MCHC 34.3 32.0 - 36.0 g/dL   RDW 13.6 11.5 - 14.5 %   Platelets 310 150 - 440 K/uL   Neutrophils Relative % 66 %   Neutro Abs 6.8 (H) 1.4 - 6.5 K/uL   Lymphocytes Relative 21 %   Lymphs Abs 2.1 1.0 - 3.6 K/uL   Monocytes Relative 11 %   Monocytes Absolute 1.1 (H) 0.2 - 1.0 K/uL   Eosinophils Relative 1 %   Eosinophils Absolute 0.1 0 - 0.7 K/uL   Basophils Relative 1 %   Basophils Absolute 0.0 0 - 0.1 K/uL  Urine Drug Screen, Qualitative ([facility] only)     Status: None   Collection Time: 08/17/15  2:35 PM  Result Value Ref Range   Tricyclic, Ur Screen NONE DETECTED NONE DETECTED   Amphetamines, Ur Screen NONE DETECTED NONE DETECTED   MDMA (Ecstasy)Ur Screen NONE DETECTED NONE DETECTED   Cocaine Metabolite,Ur [place] NONE DETECTED NONE DETECTED   Opiate, Ur Screen NONE DETECTED NONE DETECTED   Phencyclidine (PCP) Ur S NONE DETECTED NONE DETECTED   Cannabinoid 50 Ng, Ur [place] NONE DETECTED NONE DETECTED   Barbiturates, Ur Screen NONE DETECTED NONE DETECTED   Benzodiazepine, Ur Scrn NONE DETECTED NONE DETECTED   Methadone Scn, Ur NONE DETECTED NONE DETECTED    Comment: (NOTE) 468  Tricyclics, urine               Cutoff 1000 ng/mL 200  Amphetamines, urine             Cutoff 1000 ng/mL 300  MDMA (Ecstasy), urine           Cutoff 500 ng/mL 400   Cocaine Metabolite, urine       Cutoff 300 ng/mL 500  Opiate, urine                   Cutoff 300 ng/mL 600  Phencyclidine (PCP), urine      Cutoff 25 ng/mL 700  Cannabinoid, urine              Cutoff 50 ng/mL 800  Barbiturates, urine             Cutoff 200 ng/mL 900  Benzodiazepine, urine           Cutoff 200 ng/mL 1000 Methadone, urine                Cutoff 300 ng/mL 1100 1200 The urine drug screen provides only a preliminary, unconfirmed 1300 analytical test result and should not be used for non-medical 1400 purposes. Clinical consideration and professional judgment should 1500 be applied to any positive drug screen result due to possible 1600 interfering substances. A more specific alternate chemical method 1700 must be used in order to obtain a confirmed analytical result.  1800 Gas chromato graphy / mass spectrometry (GC/MS) is the preferred 1900 confirmatory method.   Urinalysis complete, with microscopic ([facility] only)     Status: Abnormal   Collection Time: 08/17/15  2:35 PM  Result Value Ref Range   Color, Urine YELLOW (A) YELLOW   APPearance CLEAR (A) CLEAR   Glucose, UA NEGATIVE NEGATIVE mg/dL   Bilirubin Urine NEGATIVE NEGATIVE   Ketones, ur NEGATIVE NEGATIVE mg/dL   Specific Gravity, Urine 1.020 1.005 - 1.030   Hgb urine dipstick NEGATIVE NEGATIVE   pH 5.0 5.0 - 8.0  Protein, ur 30 (A) NEGATIVE mg/dL   Nitrite NEGATIVE NEGATIVE   Leukocytes, UA NEGATIVE NEGATIVE   RBC / HPF 0-5 0 - 5 RBC/hpf   WBC, UA 0-5 0 - 5 WBC/hpf   Bacteria, UA NONE SEEN NONE SEEN   Squamous Epithelial / LPF NONE SEEN NONE SEEN   Mucous PRESENT    Hyaline Casts, UA PRESENT   Acetaminophen level     Status: Abnormal   Collection Time: 08/17/15  2:36 PM  Result Value Ref Range   Acetaminophen (Tylenol), Serum <10 (L) 10 - 30 ug/mL    Comment:        THERAPEUTIC CONCENTRATIONS VARY SIGNIFICANTLY. A RANGE OF 10-30 ug/mL MAY BE AN EFFECTIVE CONCENTRATION FOR MANY PATIENTS. HOWEVER, SOME ARE  BEST TREATED AT CONCENTRATIONS OUTSIDE THIS RANGE. ACETAMINOPHEN CONCENTRATIONS >150 ug/mL AT 4 HOURS AFTER INGESTION AND >50 ug/mL AT 12 HOURS AFTER INGESTION ARE OFTEN ASSOCIATED WITH TOXIC REACTIONS.   Salicylate level     Status: None   Collection Time: 08/17/15  2:36 PM  Result Value Ref Range   Salicylate Lvl <9.3 2.8 - 30.0 mg/dL    Blood Alcohol level:  Lab Results  Component Value Date   [facility] <5 08/17/2015   ETH <5 71/69/6789    Metabolic Disorder Labs:  Lab Results  Component Value Date   HGBA1C 5.2 10/26/2014   No results found for: PROLACTIN Lab Results  Component Value Date   CHOL 184 10/26/2014   TRIG 107 10/26/2014   HDL 53 10/26/2014   CHOLHDL 3.5 10/26/2014   VLDL 21 10/26/2014   LDLCALC 110* 10/26/2014    Current Medications: Current Facility-Administered Medications  Medication Dose Route Frequency Provider Last Rate Last Dose  . ibuprofen (ADVIL,MOTRIN) tablet 600 mg  600 mg Oral Q6H PRN Jolanta B Pucilowska, MD      . LORazepam (ATIVAN) tablet 1 mg  1 mg Oral Q4H PRN Clovis Fredrickson, MD   1 mg at 08/18/15 0026  . magnesium hydroxide (MILK OF MAGNESIA) suspension 30 mL  30 mL Oral Daily PRN Jolanta B Pucilowska, MD      . nicotine (NICODERM CQ - dosed in mg/24 hours) patch 21 mg  21 mg Transdermal Q0600 Clovis Fredrickson, MD   21 mg at 08/18/15 3810  . paliperidone (INVEGA SUSTENNA) injection 234 mg  234 mg Intramuscular Q28 days Jolanta B Pucilowska, MD      . paliperidone (INVEGA) 24 hr tablet 9 mg  9 mg Oral Daily Jolanta B Pucilowska, MD   9 mg at 08/18/15 1751   PTA Medications: Prescriptions prior to admission  Medication Sig Dispense Refill Last Dose  . amantadine (SYMMETREL) 100 MG capsule Take 1 capsule (100 mg total) by mouth 2 (two) times daily. 60 capsule 0   . omeprazole (PRILOSEC) 20 MG capsule Take 1 capsule (20 mg total) by mouth daily. 30 capsule 0   . paliperidone (INVEGA SUSTENNA) 156 MG/ML SUSP injection Inject 1 mL  (156 mg total) into the muscle once. 0.9 mL 0   . paliperidone (INVEGA) 6 MG 24 hr tablet Take 1 tablet (6 mg total) by mouth at bedtime. 30 tablet 0     Musculoskeletal: Strength & Muscle Tone: within normal limits Gait & Station: normal Patient leans: N/A  Psychiatric Specialty Exam: I reviewed physical exam performed in the emergency room and agree with the findings. Physical Exam  Nursing note and vitals reviewed.   Review of Systems  Psychiatric/Behavioral: Positive for hallucinations.  All other systems reviewed and are negative.   Blood pressure 129/88, pulse 63, temperature 98.1 F (36.7 C), temperature source Oral, resp. rate 18, height _0  (1.753 m), weight 116.121 kg (256 lb), SpO2 99 %.Body mass index is 37.79 kg/(m^2).  See SRA.                                                  Sleep:  Number of Hours: 2.75       Treatment Plan Summary: Daily contact with patient to assess and evaluate symptoms and progress in treatment and Medication management   Mr. Mccorkle  is a 30 year old male with history of schizophrenia admitted for auditory hallucinations and thought disorganization in the context of medication noncompliance.  1. Psychosis. We will restart oral Invega 9 mg daily. The patient agreed to [place] injection dose today on 6/25. Next injection in 4 days.   3. EPS. Will add Amantadine.   4. Insomnia. He did not respond to trazodone. We'll offer Restoril.  5. Smoking. Nicotine patch is available.  6. Metabolic syndrome screening. Lipid profile, TSH, hemoglobin A1c and prolactin are pending.   7. Disposition. The patient will be discharged to home with his mother. He will follow up with [facility].   Observation Level/Precautions:  15 minute checks  Laboratory:  CBC Chemistry Profile UDS UA  Psychotherapy:    Medications:    Consultations:    Discharge Concerns:    Estimated LOS:  Other:     I certify that inpatient  services furnished can reasonably be expected to improve the patient's condition.    Orson Slick, MD 6/25/201710:18 AM

## 2015-08-18 NOTE — [facility] Group Notes (Signed)
[facility] Group Notes:  (Nursing/MHT/Case Management/Adjunct)  Date:  08/18/2015  Time:  10:21 AM  Type of Therapy:  Goal Setting  Participation Level:  Did Not Attend  Shane Allen 08/18/2015, 10:21 AM 

## 2015-08-18 NOTE — Progress Notes (Signed)
Pt appears anxious. He reports trouble falling asleep. PRN Ativan administered with relief.

## 2015-08-18 NOTE — [facility] Group Notes (Signed)
[facility] LCSW Group Therapy  08/18/2015 2:45 PM  Type of Therapy:  Group Therapy  Participation Level:  Minimal  Participation Quality:  Attentive  Affect:  Appropriate  Cognitive:  Alert  Insight:  Limited  Engagement in Therapy:  Limited  Modes of Intervention:  Discussion, Education, Socialization and Support  Summary of Progress/Problems: Todays topic: Grudges  Patients will be encouraged to discuss their thoughts, feelings, and behaviors as to why one holds on to grudges and reasons why people have grudges. Patients will process the impact of grudges on their daily lives and identify thoughts and feelings related to holding grudges. Patients will identify feelings and thoughts related to what life would look like without grudges. Pt attended group and stayed the entire time. Pt sat quietly and listened to other group members share.    Manessa Buley L Tyan Dy MSW, LCSWA  08/18/2015, 2:45 PM   

## 2015-08-19 LAB — PROLACTIN: PROLACTIN: 21.7 ng/mL — AB (ref 4.0–15.2)

## 2015-08-19 MED ORDER — PALIPERIDONE PALMITATE 156 MG/ML IM SUSP
156.0000 mg | Freq: Once | INTRAMUSCULAR | Status: AC
  Administered 2015-08-22: 156 mg via INTRAMUSCULAR
  Filled 2015-08-19: qty 1

## 2015-08-19 NOTE — Plan of Care (Signed)
Problem: Coping: Goal: Ability to verbalize frustrations and anger appropriately will improve Outcome: Not Progressing Patient not able to verblize frustrations at this time on this unit [facility]

## 2015-08-19 NOTE — Progress Notes (Signed)
D: Patient appears very depressed. When asked if he can explain to this writer what's going on he appears scared and states that he can't. He states that he's hear because of voices but he they're not telling him to do anything. He denies SI/HI. He denies pain.  A: Medication was given with education. Encouragement was provided.  R: Patient was compliant with medication. He has remained calm and cooperative. Safety maintained with 15 min checks.

## 2015-08-19 NOTE — Progress Notes (Signed)
Patient verbalized that he feels better today.Stated that the voices are less intense.Denies suicidal and homicidal ideations.Appropriate with staff & peers.Attended groups.Compliant with medications.Appetite & energy level good.

## 2015-08-19 NOTE — [facility] Group Notes (Signed)
[facility] Group Notes:  (Nursing/MHT/Case Management/Adjunct)  Date:  08/19/2015  Time:  4:15 PM  Type of Therapy:  Psychoeducational Skills  Participation Level:  Active  Participation Quality:  Appropriate  Affect:  Appropriate  Cognitive:  Appropriate  Insight:  Appropriate  Engagement in Group:  Engaged  Modes of Intervention:  Discussion and Education  Summary of Progress/Problems:  [place] Grayer M Moses Ellison 08/19/2015, 4:15 PM 

## 2015-08-19 NOTE — Progress Notes (Signed)
D: Patient is alert and oriented on the unit this shift. Patient attended   in groups today. Patient denies suicidal ideation, homicidal ideation, auditory or visual hallucinations at the present time.  A: Scheduled medications are administered to patient as per MD orders. Emotional support and encouragement are provided. Patient is maintained on q.15 minute safety checks. Patient is informed to notify staff with questions or concerns. R: No adverse medication reactions are noted. Patient is cooperative but isolative on the unit. Pt complies with treatment plan.Patient is cooperative with medication and contracts for safety at this time. Patient does not interact with others at this time   Patient remains safe on unit at this time   Depression 6/10 ,anxiety3/10 at this time
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
## 2015-08-19 NOTE — Progress Notes (Signed)
[facility]  08/19/2015 12:11 PM Shane Allen  MRN:  [id number] Subjective:  Patient reports doing much better. He however continues to have auditory hallucinations however they are not as intense as they were prior to coming in. He tells me he was hearing voices that were telling him to go dated things about himself and was also seeing demonic figures.  Patient appeared to be interacting to internal stimuli during the interview.  Patient has been taking Invega, he denies having any side effects from the medication. He received Invega injectable yesterday.    Patient denies having problems with appetite, he does report feeling as if he had low energy. He tells me he was not sleeping well prior to coming in. Last night he took trazodone and he was able to sleep somewhat better but still had a hard time falling asleep.  Patient denies having suicidality, homicidality. He denies having any physical complaints  Per nursing: D: Patient appears very depressed. When asked if he can explain to this writer what's going on he appears scared and states that he can't. He states that he's hear because of voices but he they're not telling him to do anything. He denies SI/HI. He denies pain.  A: Medication was given with education. Encouragement was provided.  R: Patient was compliant with medication. He has remained calm and cooperative. Safety maintained with 15 min checks.   Principal Problem: Schizophrenia, unspecified (HCC) Diagnosis:   Patient Active Problem List   Diagnosis Date Noted  . Schizophrenia, unspecified (HCC) [F20.9] 08/17/2015  . Tobacco use disorder [F17.200] 10/27/2014   Total Time spent with patient: 30 minutes  Past Psychiatric History: Carries a diagnosis of schizophrenia was hospitalized in our unit back in 2016 for psychosis  Past Medical History:  Past Medical History  Diagnosis Date  . Schizo affective schizophrenia (HCC)   . 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    Past Surgical History  Procedure Laterality Date  . No past surgeries     Family History: History reviewed. No pertinent family history. Family Psychiatric  History:   Social History:  History  Alcohol Use No    Comment: 4 to 5 beers a day every other week for sleep     History  Drug Use No    Social History   Social History  . Marital Status: Single    Spouse Name: N/A  . Number of Children: N/A  . Years of Education: N/A   Social History Main Topics  . Smoking status: Current Every Day Smoker -- 1.00 packs/day for 10 years    Types: Cigarettes  . Smokeless tobacco: Never Used  . Alcohol Use: No     Comment: 4 to 5 beers a day every other week for sleep  . Drug Use: No  . Sexual Activity: No   Other Topics Concern  . None   Social History Narrative   Additional Social History:    Pain Medications: denies Prescriptions: see PTA meds Over the Counter: denies History of alcohol / drug use?: No history of alcohol / drug abuse      Current Medications: Current Facility-Administered Medications  Medication Dose Route Frequency Provider Last Rate Last Dose  . amantadine (SYMMETREL) capsule 100 mg  100 mg Oral BID Shari ProwsJolanta B Pucilowska, MD   100 mg at 08/19/15 0924  . ibuprofen (ADVIL,MOTRIN) tablet 600 mg  600 mg Oral  Q6H PRN Shari Prows, MD      . LORazepam (ATIVAN) tablet 1 mg  1 mg Oral Q4H PRN Jolanta B Pucilowska, MD   1 mg at 08/18/15 1020  . magnesium hydroxide (MILK OF MAGNESIA) suspension 30 mL  30 mL Oral Daily PRN Jolanta B Pucilowska, MD      . nicotine (NICODERM CQ - dosed in mg/24 hours) patch 21 mg  21 mg Transdermal Q0600 Shari Prows, MD   21 mg at 08/19/15 0646  . [START ON 08/22/2015] paliperidone (INVEGA SUSTENNA) injection 156 mg  156 mg Intramuscular Once Jimmy Footman, MD      . paliperidone (INVEGA) 24 hr tablet 9 mg  9 mg Oral Daily Jolanta B  Pucilowska, MD   9 mg at 08/19/15 0924  . temazepam (RESTORIL) capsule 15 mg  15 mg Oral QHS Shari Prows, MD   15 mg at 08/18/15 2204    Lab Results:  Results for orders placed or performed during the hospital encounter of 08/17/15 (from the past 48 hour(s))  Hemoglobin A1c     Status: None   Collection Time: 08/18/15  6:57 AM  Result Value Ref Range   Hgb A1c MFr Bld 5.1 4.0 - 6.0 %  Lipid panel, fasting     Status: Abnormal   Collection Time: 08/18/15  6:57 AM  Result Value Ref Range   Cholesterol 258 (H) 0 - 200 mg/dL   Triglycerides 161 <096 mg/dL   HDL 36 (L) >04 mg/dL   Total CHOL/HDL Ratio 7.2 RATIO   VLDL 24 0 - 40 mg/dL   LDL Cholesterol 540 (H) 0 - 99 mg/dL    Comment:        Total Cholesterol/HDL:CHD Risk Coronary Heart Disease Risk Table                     Men   Women  1/2 Average Risk   3.4   3.3  Average Risk       5.0   4.4  2 X Average Risk   9.6   7.1  3 X Average Risk  23.4   11.0        Use the calculated Patient Ratio above and the CHD Risk Table to determine the patient's CHD Risk.        ATP III CLASSIFICATION (LDL):  <100     mg/dL   Optimal  981-191  mg/dL   Near or Above                    Optimal  130-159  mg/dL   Borderline  478-295  mg/dL   High  >621     mg/dL   Very High   TSH     Status: None   Collection Time: 08/18/15  6:57 AM  Result Value Ref Range   TSH 0.943 0.350 - 4.500 uIU/mL    Blood Alcohol level:  Lab Results  Component Value Date   ETH <5 08/17/2015   ETH <5 10/26/2014    Metabolic Disorder Labs: Lab Results  Component Value Date   HGBA1C 5.1 08/18/2015   No results found for: PROLACTIN Lab Results  Component Value Date   CHOL 258* 08/18/2015   TRIG 118 08/18/2015   HDL 36* 08/18/2015   CHOLHDL 7.2 08/18/2015   VLDL 24 08/18/2015   LDLCALC 198* 08/18/2015   LDLCALC 110* 10/26/2014    Physical Findings: AIMS: Facial and Oral Movements Muscles  of Facial Expression: None, normal Lips and  Perioral Area: None, normal Jaw: None, normal Tongue: None, normal,Extremity Movements Upper (arms, wrists, hands, fingers): None, normal Lower (legs, knees, ankles, toes): None, normal, Trunk Movements Neck, shoulders, hips: None, normal, Overall Severity Severity of abnormal movements (highest score from questions above): None, normal Incapacitation due to abnormal movements: None, normal Patient's awareness of abnormal movements (rate only patient's report): No Awareness, Dental Status Current problems with teeth and/or dentures?: No Does patient usually wear dentures?: No  CIWA:    COWS:     Musculoskeletal: Strength & Muscle Tone: within normal limits Gait & Station: normal Patient leans: N/A  Psychiatric Specialty Exam: Physical Exam  Constitutional: He is oriented to person, place, and time. He appears well-developed and well-nourished.  HENT:  Head: Normocephalic and atraumatic.  Eyes: EOM are normal.  Neck: Normal range of motion.  Respiratory: Effort normal.  Musculoskeletal: Normal range of motion.  Neurological: He is alert and oriented to person, place, and time.  Skin: Skin is dry.    Review of Systems  Constitutional: Negative.   HENT: Negative.   Eyes: Negative.   Respiratory: Negative.   Cardiovascular: Negative.   Gastrointestinal: Negative.   Genitourinary: Negative.   Musculoskeletal: Negative.   Skin: Negative.   Neurological: Negative.   Endo/Heme/Allergies: Negative.   Psychiatric/Behavioral: Positive for hallucinations. Negative for depression, suicidal ideas, memory loss and substance abuse. The patient has insomnia. The patient is not nervous/anxious.     Blood pressure 126/87, pulse 81, temperature 98.3 F (36.8 C), temperature source Oral, resp. rate 18, height 5\' 9"  (1.753 m), weight 116.121 kg (256 lb), SpO2 99 %.Body mass index is 37.79 kg/(m^2).  General Appearance: Disheveled  Eye Contact:  Fair  Speech:  Clear and Coherent  Volume:   Decreased  Mood:  Dysphoric  Affect:  Blunt  Thought Process:  Linear and Descriptions of Associations: Intact  Orientation:  Full (Time, Place, and Person)  Thought Content:  Hallucinations: Auditory  Suicidal Thoughts:  No  Homicidal Thoughts:  No  Memory:  Immediate;   Fair Recent;   Fair Remote;   Fair  Judgement:  Fair  Insight:  Fair  Psychomotor Activity:  Decreased  Concentration:  Concentration: Fair and Attention Span: Fair  Recall:  [employer]is[employer]Fair  Fund of Knowledge:  Fair  Language:  Fair  Akathisia:  No  Handed:    AIMS (if indicated):     Assets:  Manufacturing systems engineerCommunication Skills Physical Health Social Support  ADL's:  Intact  Cognition:  WNL  Sleep:  Number of Hours: 7.25     Treatment Plan Summary:  Schizophrenia: The patient will be continued on Invega 9 mg by mouth daily. He received Invega Sustenna 234 mg on 6/25.  He will receive second injection of 156 mg on 6/29  EPS and hyperprolactinemia: Continue amantadine 100 mg by mouth twice a day to prevent EPS and hyperprolactinemia.  Prolactin has been ordered however results are still pending  Insomnia the patient will be continued on Restoril 15 mg by mouth daily at bedtime  Tobacco use disorder continue nicotine patch at 21 mg a day  Diet regular  Vital signs daily  Precautions every 15 minute checks  Hospitalization and status continue involuntary commitment  Labs: Hemoglobin A1c 5.1, TSH within the normal limits, lipid panel has been completed. Prolactin has been ordered but results are pending.  Disposition: Patient [place]. He has been following up with  [facility].  Patient says he has a lawyer helping him apply for disability.  Jimmy Footman, MD 08/19/2015, 12:11 PM

## 2015-08-19 NOTE — Progress Notes (Signed)
Recreation Therapy Notes  Date: 06.26.17 Time: 9:30 am Location: Craft Room  Group Topic: Self-expression  Goal Area(s) Addresses:  Patient will effectively use art as a means of self-expression. Patient will be able to identify one emotion experienced during group session.  Behavioral Response: Attentive, Interactive, Left early  Intervention: Two Faces of Me  Activity: Patients were given a blank face worksheet and instructed to draw a line down the middle. On one side of the face, patients were instructed to draw or write how they felt when they were admitted to the hospital and on the other side they were instructed to draw or write how they want to feel when they are discharged.  Education: LRT educated patients on different forms of self-expression.  Education Outcome: Patient left before LRT educated group.  Clinical Observations/Feedback: Patient completed activity by writing how he felt when he was admitted and how he wanted to feel when he is d/c. Patient contributed to group discussion by stating how his faces were different. Patient left at approximately 10:15 am with Dr. Ardyth HarpsHernandez. Patient did not return to group.  Jacquelynn CreeGreene,Zakya Halabi M, LRT/CTRS 08/19/2015 10:27 AM

## 2015-08-19 NOTE — [facility] Suicide Risk Assessment (Addendum)
[facility] INPATIENT:  Family/Significant Other Suicide Prevention Education  Suicide Prevention Education:  Contact Attempts: Mother, [name] and brother, [name] @ [phone] and @ [phone]  has been identified by the patient as the family member/significant other with whom the patient will be residing, and identified as the person(s) who will aid the patient in the event of a mental health crisis.  With written consent from the patient, two attempts were made to provide suicide prevention education, prior to and/or following the patient's discharge.  We were unsuccessful in providing suicide prevention education.  A suicide education pamphlet was given to the patient to share with family/significant other. CSW left a couple of messages for mother. Awaiting call back. CSW reviewed SPE with patient and will attempt to contact mother, [name] tomorrow 08/21/15.  Date and time of first attempt: 08/19/15 at 10am Date and time of second attempt: 08/20/15 at 2:35pm  [name], LCSW-A 08/20/2015, 2:36PM

## 2015-08-19 NOTE — Plan of Care (Signed)
Problem: Safety: Goal: Ability to remain free from injury will improve Outcome: Progressing Patient has remained free from injury during this shift.   

## 2015-08-20 MED ORDER — PALIPERIDONE ER 3 MG PO TB24
12.0000 mg | ORAL_TABLET | Freq: Every day | ORAL | Status: DC
  Administered 2015-08-20 – 2015-08-21 (×2): 12 mg via ORAL
  Filled 2015-08-20 (×2): qty 4

## 2015-08-20 NOTE — Plan of Care (Signed)
Problem: Coping: Goal: Ability to verbalize frustrations and anger appropriately will improve Outcome: Progressing Working on coping skills    

## 2015-08-20 NOTE — Progress Notes (Signed)
Recreation Therapy Notes  Date: 06.27.17 Time: 9:30 am Location: Craft Room  Group Topic: Goal Setting  Goal Area(s) Addresses:  Patient will identify at least one goal. Patient will identify at least one obstacle.  Behavioral Response: Did not attend  Intervention: Recovery Goal Chart  Activity: Patients were instructed to make a Recovery Goal Chart including goals, obstacles, date they started working on their goal, and date they achieved their goal.  Education: LRT educated patients on healthy ways to celebrate reaching their goals.  Education Outcome: Patient did not attend group.  Clinical Observations/Feedback: Patient did not attend group.  Jacquelynn CreeGreene,Juwaun Inskeep M, LRT/CTRS 08/20/2015 10:14 AM

## 2015-08-20 NOTE — [facility] Group Notes (Signed)
[facility] LCSW Group Therapy   08/20/2015 3:00PM  Type of Therapy: Group Therapy   Participation Level: Invited but did not attend.  Participation Quality: Invited but did not attend.   Luccia Reinheimer R. Abrey Bradway, LCSWA   

## 2015-08-20 NOTE — Progress Notes (Signed)
D: Patient stated slept good last night .Stated appetite is good and energy level  Is normal. Stated concentration is poor. Stated on Depression scale 0 , hopeless 0 and anxiety 5 .( low 0-10 high) Denies suicidal  homicidal ideations  .   Auditory hallucinations voice of  The voices trying to take  Control. Questioned  Patient what does he do  With the voices .  Stated he stares out . Writer and patient came up with other  Coping skills.   No pain concerns . Appropriate ADL'S. Interacting with peers and staff.  Patient went to group this shift.  A: Encourage patient participation with unit programming . Instruction  Given on  Medication , verbalize understanding. R: Voice no other concerns. Staff continue to monitor

## 2015-08-20 NOTE — Progress Notes (Signed)
[facility] MD Progress Note  08/20/2015 8:36 AM Shane Allen  MRN:  [id number] Subjective:  Patient reports doing much better. He says that today he is not having any auditory or visual hallucinations. He says also that he has been sleeping well for the last 2 nights with the Restoril.  Patient denies having suicidality, homicidality. He denies having any side effects from medications. As far as physical complaints the patient says that this morning he was feeling dizzy.  Per nursing:D: Patient is alert and oriented on the unit this shift. Patient attended in groups today. Patient denies suicidal ideation, homicidal ideation, auditory or visual hallucinations at the present time.  A: Scheduled medications are administered to patient as per MD orders. Emotional support and encouragement are provided. Patient is maintained on q.15 minute safety checks. Patient is informed to notify staff with questions or concerns. R: No adverse medication reactions are noted. Patient is cooperative but isolative on the unit. Pt complies with treatment plan.Patient is cooperative with medication and contracts for safety at this time. Patient does not interact with others at this time Patient remains safe on unit at this time Depression 6/10 ,anxiety3/10 at this time   Principal Problem: Schizophrenia, unspecified (HCC) Diagnosis:   Patient Active Problem List   Diagnosis Date Noted  . Schizophrenia, unspecified (HCC) [F20.9] 08/17/2015  . Tobacco use disorder [F17.200] 10/27/2014   Total Time spent with patient: 30 minutes  Past Psychiatric History: Carries a diagnosis of schizophrenia was hospitalized in our unit back in 2016 for psychosis  Past Medical History:  Past Medical History  Diagnosis Date  . Schizo affective schizophrenia (HCC)   . 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    Past Surgical History  Procedure Laterality Date  . No past  surgeries     Family History: History reviewed. No pertinent family history. Family Psychiatric  History:   Social History:  History  Alcohol Use No    Comment: 4 to 5 beers a day every other week for sleep     History  Drug Use No    Social History   Social History  . Marital Status: Single    Spouse Name: N/A  . Number of Children: N/A  . Years of Education: N/A   Social History Main Topics  . Smoking status: Current Every Day Smoker -- 1.00 packs/day for 10 years    Types: Cigarettes  . Smokeless tobacco: Never Used  . Alcohol Use: No     Comment: 4 to 5 beers a day every other week for sleep  . Drug Use: No  . Sexual Activity: No   Other Topics Concern  . None   Social History Narrative   Additional Social History:    Pain Medications: denies Prescriptions: see PTA meds Over the Counter: denies History of alcohol / drug use?: No history of alcohol / drug abuse      Current Medications: Current Facility-Administered Medications  Medication Dose Route Frequency Provider Last Rate Last Dose  . amantadine (SYMMETREL) capsule 100 mg  100 mg Oral BID Shari Prows, MD   100 mg at 08/19/15 2201  . ibuprofen (ADVIL,MOTRIN) tablet 600 mg  600 mg Oral Q6H PRN Jolanta B Pucilowska, MD      . LORazepam (ATIVAN) tablet 1 mg  1 mg Oral Q4H PRN Jolanta B Pucilowska, MD   1 mg at 08/18/15 1020  . magnesium hydroxide (  MILK OF MAGNESIA) suspension 30 mL  30 mL Oral Daily PRN Jolanta B Pucilowska, MD      . nicotine (NICODERM CQ - dosed in mg/24 hours) patch 21 mg  21 mg Transdermal Q0600 Shari ProwsJolanta B Pucilowska, MD   21 mg at 08/19/15 0646  . [START ON 08/22/2015] paliperidone (INVEGA SUSTENNA) injection 156 mg  156 mg Intramuscular Once Jimmy FootmanAndrea Hernandez-Gonzalez, MD      . paliperidone (INVEGA) 24 hr tablet 12 mg  12 mg Oral Daily Jimmy FootmanAndrea Hernandez-Gonzalez, MD      . temazepam (RESTORIL) capsule 15 mg  15 mg Oral QHS Shari ProwsJolanta B Pucilowska, MD   15 mg at 08/19/15 2201     Lab Results:  No results found for this or any previous visit (from the past 48 hour(s)).  Blood Alcohol level:  Lab Results  Component Value Date   [facility] 08/17/2015   ETH <5 10/26/2014    Metabolic Disorder Labs: Lab Results  Component Value Date   HGBA1C 5.1 08/18/2015   Lab Results  Component Value Date   PROLACTIN 21.7* 08/18/2015   Lab Results  Component Value Date   CHOL 258* 08/18/2015   TRIG 118 08/18/2015   HDL 36* 08/18/2015   CHOLHDL 7.2 08/18/2015   VLDL 24 08/18/2015   LDLCALC 198* 08/18/2015   LDLCALC 110* 10/26/2014    Physical Findings: AIMS: Facial and Oral Movements Muscles of Facial Expression: None, normal Lips and Perioral Area: None, normal Jaw: None, normal Tongue: None, normal,Extremity Movements Upper (arms, wrists, hands, fingers): None, normal Lower (legs, knees, ankles, toes): None, normal, Trunk Movements Neck, shoulders, hips: None, normal, Overall Severity Severity of abnormal movements (highest score from questions above): None, normal Incapacitation due to abnormal movements: None, normal Patient's awareness of abnormal movements (rate only patient's report): No Awareness, Dental Status Current problems with teeth and/or dentures?: No Does patient usually wear dentures?: No  CIWA:    COWS:     Musculoskeletal: Strength & Muscle Tone: within normal limits Gait & Station: normal Patient leans: N/A  Psychiatric Specialty Exam: Physical Exam  Constitutional: He is oriented to person, place, and time. He appears well-developed and well-nourished.  HENT:  Head: Normocephalic and atraumatic.  Eyes: EOM are normal.  Neck: Normal range of motion.  Respiratory: Effort normal.  Musculoskeletal: Normal range of motion.  Neurological: He is alert and oriented to person, place, and time.  Skin: Skin is dry.    Review of Systems  Constitutional: Negative.   HENT: Negative.   Eyes: Negative.   Respiratory: Negative.    Cardiovascular: Negative.   Gastrointestinal: Negative.   Genitourinary: Negative.   Musculoskeletal: Negative.   Skin: Negative.   Neurological: Negative.   Endo/Heme/Allergies: Negative.   Psychiatric/Behavioral: Positive for hallucinations. Negative for depression, suicidal ideas, memory loss and substance abuse. The patient has insomnia. The patient is not nervous/anxious.     Blood pressure 134/73, pulse 64, temperature 98.3 F (36.8 C), temperature source Oral, resp. rate 18, height 5\' 9"  (1.753 m), weight 116.121 kg (256 lb), SpO2 99 %.Body mass index is 37.79 kg/(m^2).  General Appearance: Disheveled  Eye Contact:  Fair  Speech:  Clear and Coherent  Volume:  Decreased  Mood:  Dysphoric  Affect:  Blunt  Thought Process:  Linear and Descriptions of Associations: Intact  Orientation:  Full (Time, Place, and Person)  Thought Content:  Hallucinations: Auditory  Suicidal Thoughts:  No  Homicidal Thoughts:  No  Memory:  Immediate;   Fair Recent;  Fair Remote;   Fair  Judgement:  Fair  Insight:  Fair  Psychomotor Activity:  Decreased  Concentration:  Concentration: Fair and Attention Span: Fair  Recall:  F[employer]Fair  Fund of Knowledge:  Fair  Language:  Fair  Akathisia:  No  Handed:    AIMS (if indicated):     Assets:  Manufacturing systems engineerCommunication Skills Physical Health Social Support  ADL's:  Intact  Cognition:  WNL  Sleep:  Number of Hours: 8     Treatment Plan Summary:  Schizophrenia: The patient will be continued on Invega but dose will be increased to 12 mg by mouth daily. He received Invega Sustenna 234 mg on 6/25.  He will receive second injection of 156 mg on 6/29  EPS and hyperprolactinemia: Continue amantadine 100 mg by mouth twice a day to prevent EPS and hyperprolactinemia.  Prolactin has been ordered however results are still pending  Insomnia the patient will be continued on Restoril 15 mg by mouth daily at bedtime  Tobacco use disorder continue nicotine patch at 21 mg  a day  Diet regular  Vital signs daily  Precautions every 15 minute checks  Hospitalization and status continue involuntary commitment  Labs: Hemoglobin A1c 5.1, TSH within the normal limits, lipid panel has been completed. Prolactin was mildly increased.  Disposition: Patient will [place]. He has been following up with [facility].  Patient says he has a lawyer helping him apply for disability.  Possible d/c in 3-5 days  Jimmy FootmanHernandez-Gonzalez,  Miah Boye, MD 08/20/2015, 8:36 AM

## 2015-08-20 NOTE — [facility] Group Notes (Signed)
[facility] Group Notes:  (Nursing/MHT/Case Management/Adjunct)  Date:  08/20/2015  Time:  2:09 PM  Type of Therapy:  Psychoeducational Skills  Participation Level:  Active  Participation Quality:  Appropriate, Sharing and Supportive  Affect:  Appropriate and Flat  Cognitive:  Appropriate  Insight:  Appropriate  Engagement in Group:  Engaged and Supportive  Modes of Intervention:  Discussion, Education and Support  Summary of Progress/Problems:  Twanna Hymanda C Anber Mckiver 08/20/2015, 2:09 PM

## 2015-08-20 NOTE — Plan of Care (Signed)
Problem: Coping: Goal: Ability to cope will improve Outcome: Progressing Listing coping skills

## 2015-08-21 MED ORDER — AMANTADINE HCL 100 MG PO CAPS: 200.0000 mg | ORAL_CAPSULE | Freq: Every day | ORAL | Status: DC

## 2015-08-21 MED ORDER — PALIPERIDONE PALMITATE 234 MG/1.5ML IM SUSP: 234.0000 mg | Freq: Once | INTRAMUSCULAR | Status: DC

## 2015-08-21 MED ORDER — PALIPERIDONE ER 3 MG PO TB24: 12.0000 mg | ORAL_TABLET | Freq: Every day | ORAL | Status: DC

## 2015-08-21 MED ORDER — HYDROXYZINE PAMOATE 50 MG PO CAPS: 50.0000 mg | ORAL_CAPSULE | Freq: Every evening | ORAL | Status: DC | PRN

## 2015-08-21 MED ORDER — PALIPERIDONE ER 6 MG PO TB24: 12.0000 mg | ORAL_TABLET | Freq: Every day | ORAL | Status: DC

## 2015-08-21 NOTE — Plan of Care (Signed)
Problem: Coping: Goal: Ability to verbalize frustrations and anger appropriately will improve Outcome: Progressing Developing coping skills     

## 2015-08-21 NOTE — B[facility] Group Notes (Signed)
[facility] LCSW Group Therapy   08/21/2015  1:00pm   Type of Therapy: Group Therapy   Participation Level: Active   Participation Quality: Attentive, Sharing and Supportive   Affect: Flat   Cognitive: Alert and Oriented   Insight: Developing/Improving and Engaged   Engagement in Therapy: Developing/Improving and Engaged   Modes of Intervention: Clarification, Confrontation, Discussion, Education, Exploration, Limit-setting, Orientation, Problem-solving, Rapport Building, Dance movement psychotherapisteality Testing, Socialization and Support   Summary of Progress/Problems: The topic for group today was emotional regulation. This group focused on both positive and negative emotion identification and allowed  group members to process ways to identify feelings, regulate negative emotions, and find healthy ways to manage internal/external emotions. Group members were asked to reflect on a time when their reaction to an emotion led to a negative outcome and explored how alternative responses using emotion regulation would have benefited them. Group members were also asked to discuss a time when emotion regulation was utilized when a negative emotion was experienced. Pt has insight on identifying negative emotions and emotion regulation. Pt states that coping mechanisms such as reading the bible, positive self-talk, and social support could create positive emotion regulation which is his goal.    Lynden OxfordKadijah R. Hakeen Shipes, MSW, Theresia MajorsLCSWA

## 2015-08-21 NOTE — B[facility] Suicide Risk Assessment (Signed)
[place] INPATIENT:  Family/Significant Other Suicide Prevention Education  Suicide Prevention Education:  Education Completed; Met with mom, Reed Pandy at [facility] waiting room who has been identified by the patient as the family member/significant other with whom the patient will be residing, and identified as the person(s) who will aid the patient in the event of a mental health crisis (suicidal ideations/suicide attempt).  With written consent from the patient, the family member/significant other has been provided the following suicide prevention education, prior to the and/or following the discharge of the patient.  The suicide prevention education provided includes the following:  Suicide risk factors  Suicide prevention and interventions  National Suicide Hotline telephone number  [facility] assessment telephone number  [facility] Emergency Assistance [place] and/or Residential Mobile Crisis Unit telephone number  Request made of family/significant other to:  Remove weapons (e.g., guns, rifles, knives), all items previously/currently identified as safety concern.    Remove drugs/medications (over-the-counter, prescriptions, illicit drugs), all items previously/currently identified as a safety concern.  The family member/significant other verbalizes understanding of the suicide prevention education information provided.  The family member/significant other agrees to remove the items of safety concern listed above.  Emilie Rutter, LCSW-A 08/21/2015, 12:02 PM

## 2015-08-21 NOTE — Tx Team (Signed)
Interdisciplinary Treatment Plan Update (Adult)   Date: 08/21/2015   Time Reviewed: 10:30 AM   Progress in Treatment: Improving Attending groups: Intermittently  Participating in groups: Yes, when he attends Taking medication as prescribed: Yes  Tolerating medication: Yes  Family/Significant other contact made: CSW will talk with mother when she is able to return call Patient understands diagnosis: Yes  Discussing patient identified problems/goals with staff: Yes  Medical problems stabilized or resolved: Yes  Denies suicidal/homicidal ideation: Yes  Issues/concerns per patient self-inventory: Yes  Other:   New problem(s) identified: N/A   Discharge Plan or Barriers: see below   Reason for Continuation of Hospitalization:   Depression   Anxiety   Medication Stabilization   Comments: N/A   Estimated length of stay: 3-5 days    Patient is a 30 year old male admitted for AVH with a history of schizophrenia. Patient lives in [place], [place]. Patient will benefit from crisis stabilization, medication evaluation, group therapy, and psycho education in addition to case management for discharge planning. Patient and CSW reviewed pt's identified goals and treatment plan. Pt verbalized understanding and agreed to treatment plan.    Review of initial/current patient goals per problem list:  1. Goal(s): Patient will participate in aftercare plan   Met: Yes  Target date: 3-5 days post admission date   As evidenced by: Patient will participate within aftercare plan AEB aftercare provider and housing plan at discharge being identified.  06/28: Pt will return to therapist and psychiatrist at [facility] in [place].   2. Goal (s): Patient will exhibit decreased depressive symptoms and suicidal ideations.   Met: Yes  Target date: 3-5 days post admission date   As evidenced by: Patient will utilize self-rating of depression at 3 or below and demonstrate decreased signs of  depression or be deemed stable for discharge by MD.  06/28: Pt reports a depression score of a 7. Pt denies SI/HI.  Pt reports he is safe for discharge.   3. Goal(s): Patient will demonstrate decreased signs and symptoms of anxiety.   Met: Yes  Target date: 3-5 days post admission date   As evidenced by: Patient will utilize self-rating of anxiety at 3 or below and demonstrated decreased signs of anxiety, or be deemed stable for discharge by MD  06/28: Pt denies anxiety symptoms.  Pt reports he is safe for discharge. Reports a score of 8 at this time.    4. Goal(s): Patient will demonstrate decreased signs of psychosis  * Met: Progressing  * Target date: 3-5 days post admission date  * As evidenced by: Patient will demonstrate decreased frequency of AVH or return to baseline function  06/28: Pt reports a dramatic decrease in AVH at this time. Pt reports baseline of psychosis symptoms.   Attendees:  Patient: Shane Allen Family:  Physician: Dr. Hildred Priest , MD   08/21/2015 10:30 AM  Nursing: Polly Cobia , RN      08/21/2015 10:30 AM  Clinical Social Worker: Emilie Rutter, [place]  08/21/2015 10:30 AM  Other: Everitt Amber, Recreational Therapist   08/21/2015 10:30 AM    Emilie Rutter, LCSW-A

## 2015-08-21 NOTE — Discharge Summary (Signed)
Physician Discharge Summary Note  Patient:  Shane Allen is an 30 y.o., male MRN:  [id number] DOB:  Apr 19, 1985 Patient phone:  [phone] (home)  Patient address:   [place] [address],  Total Time spent with patient: 30 minutes  Date of Admission:  08/17/2015 Date of Discharge: 08/22/15  Reason for Admission:  psychosis  Principal Problem: Schizophrenia, unspecified [facility]) Discharge Diagnoses: Patient Active Problem List   Diagnosis Date Noted  . Schizophrenia, unspecified ([place]) [F20.9] 08/17/2015  . Tobacco use disorder [F17.200] 10/27/2014   History of Present Illness:   Identifying data. Shane Allen is a 30 year old male with a history of schizophrenia.  Chief complaint. "I don't know."  History of present illness. Information was obtained from the patient and the chart the patient has a history of psychosis with his first psychotic break in 2014. Shane Allen was hospitalized at [facility] in April 2016 and discharged on [place] injections. 4 months ago Shane Allen stopped injections. Reportedly his psychiatrist at [facility] agreed to try him on a different medication but apparently the patient has not taken any. Shane Allen reports that Shane Allen was doing well for a while but for the past week Shane Allen started experiencing loud, derogatory, frightening voices of demons. Shane Allen also has some visual hallucinations. His thinking is disorganized and the patient has difficulties answering questions about his mood, anxiety, or medication history. There are no symptoms suggestive of bipolar mania. The patient has not been using alcohol or substances.  Past psychiatric history. First hospitalization month [facility] in 2014. Shane Allen was hospitalized in [facility] in [facility]. Shane Allen did not respond to Abilify, and dystonic reaction to Haldol, and did well on Invega. Amitriptyline was added to his regimen due to some jerking. Shane Allen reportedly did  well on Navane at some point. Shane Allen follows up at [facility] in [place].  Family psychiatric history. None reported.  Social history. Shane Allen lives with his mother. Shane Allen is unemployed. Shane Allen does not receive disability. Shane Allen does not have health insurance.  Past Medical History:  Past Medical History  Diagnosis Date  . Schizo affective schizophrenia ([place])   . 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    Past Surgical History  Procedure Laterality Date  . No past surgeries      Social History:  History  Alcohol Use No    Comment: 4 to 5 beers a day every other week for sleep     History  Drug Use No    Social History   Social History  . Marital Status: Single    Spouse Name: N/A  . Number of Children: N/A  . Years of Education: N/A   Social History Main Topics  . Smoking status: Current Every Day Smoker -- 1.00 packs/day for 10 years    Types: Cigarettes  . Smokeless tobacco: Never Used  . Alcohol Use: No     Comment: 4 to 5 beers a day every other week for sleep  . Drug Use: No  . Sexual Activity: No   Other Topics Concern  . None   Social History Narrative    Hospital Course:    Schizophrenia: The patient has been restarted on Invega. Dose has been titrated up to 12 mg. Shane Allen received Invega Sustenna 234 mg on 6/25 and received second injection of 156 mg on 6/29  EPS and hyperprolactinemia: Shane Allen has been educated  as to why amantadine is been prescribed. Shane Allen's been advised to take the amantadine to prevent prolactin elevation. I will change amantadine to 200 mg daily at bedtime to increase compliance. Prolactin is mildly elevated  Insomnia: the pt will be discharged on vistaril prn for insomnia  Tobacco use disorder continue nicotine patch at 21 mg a day  Hospitalization  status was: involuntary commitment  Labs: Hemoglobin A1c 5.1, TSH within the normal limits, lipid panel has been completed. Prolactin was mildly  increased.  Disposition: Patient will return to live with his mother in [place]. Shane Allen has been following up with [facility]. Patient says Shane Allen has a lawyer helping him apply for disability.  On the day of the discharge the patient appeared much improved. Hygiene and grooming were improved. Patient denied problems with mood, appetite, energy, sleep or concentration. Shane Allen reported that no longer was having any auditory or visual hallucinations.   As far as medications the patient reported having some dizziness with Invega therefore the oral medication was changed from qam to qhs. No other side effects were reported.  The patient did not require seclusion, restraints or forced medications. During his stay Shane Allen did not display any unsafe or destructive behavior.  His participation in programming was good. The patient appears hopeful, motivated for treatment and future oriented.  Per nursing staff there is no longer evidence that the patient is hallucinating. They do not report any bizarre or paranoid behavior.   Physical Findings: AIMS: Facial and Oral Movements Muscles of Facial Expression: None, normal Lips and Perioral Area: None, normal Jaw: None, normal Tongue: None, normal,Extremity Movements Upper (arms, wrists, hands, fingers): None, normal Lower (legs, knees, ankles, toes): None, normal, Trunk Movements Neck, shoulders, hips: None, normal, Overall Severity Severity of abnormal movements (highest score from questions above): None, normal Incapacitation due to abnormal movements: None, normal Patient's awareness of abnormal movements (rate only patient's report): No Awareness, Dental Status Current problems with teeth and/or dentures?: No Does patient usually wear dentures?: No  CIWA:    COWS:     Musculoskeletal: Strength & Muscle Tone: within normal limits Gait & Station: normal Patient leans: N/A  Psychiatric Specialty Exam: Physical Exam  Constitutional: Shane Allen is  oriented to person, place, and time. Shane Allen appears well-developed and well-nourished.  HENT:  Head: Normocephalic and atraumatic.  Eyes: EOM are normal.  Neck: Normal range of motion.  Respiratory: Effort normal.  Musculoskeletal: Normal range of motion.  Neurological: Shane Allen is alert and oriented to person, place, and time.    Review of Systems  Constitutional: Negative.   HENT: Negative.   Eyes: Negative.   Respiratory: Negative.   Cardiovascular: Negative.   Gastrointestinal: Negative.   Genitourinary: Negative.   Musculoskeletal: Negative.   Skin: Negative.   Neurological: Negative.   Endo/Heme/Allergies: Negative.   Psychiatric/Behavioral: Negative.     Blood pressure 113/77, pulse 85, temperature 97.9 F (36.6 C), temperature source Oral, resp. rate 18, height '5\' 9"'$  (1.753 m), weight 116.121 kg (256 lb), SpO2 99 %.Body mass index is 37.79 kg/(m^2).  General Appearance: Disheveled  Eye Contact:  Good  Speech:  Clear and Coherent  Volume:  Normal  Mood:  Euthymic  Affect:  Constricted  Thought Process:  Linear and Descriptions of Associations: Intact  Orientation:  Full (Time, Place, and Person)  Thought Content:  Hallucinations: None  Suicidal Thoughts:  No  Homicidal Thoughts:  No  Memory:  Immediate;   Fair Recent;   Fair Remote;   Fair  Judgement:  Fair  Insight:  Shallow  Psychomotor Activity:  Normal  Concentration:  Concentration: Fair and Attention Span: Fair  Recall:  [employer] of Knowledge:  Fair  Language:  Good  Akathisia:  No  Handed:    AIMS (if indicated):     Assets:  Communication Skills Physical Health  ADL's:  Intact  Cognition:  WNL  Sleep:  Number of Hours: 7.15     Have you used any form of tobacco in the last 30 days? (Cigarettes, Smokeless Tobacco, Cigars, and/or Pipes): Yes  Has this patient used any form of tobacco in the last 30 days? (Cigarettes, Smokeless Tobacco, Cigars, and/or Pipes) Yes, Yes, A prescription for an FDA-approved  tobacco cessation medication was offered at discharge and the patient refused  Blood Alcohol level:  Lab Results  Component Value Date   [facility] <5 08/17/2015   ETH <5 26/94/8546    Metabolic Disorder Labs:  Lab Results  Component Value Date   HGBA1C 5.1 08/18/2015   Lab Results  Component Value Date   PROLACTIN 21.7* 08/18/2015   Lab Results  Component Value Date   CHOL 258* 08/18/2015   TRIG 118 08/18/2015   HDL 36* 08/18/2015   CHOLHDL 7.2 08/18/2015   VLDL 24 08/18/2015   LDLCALC 198* 08/18/2015   LDLCALC 110* 10/26/2014   Results for AEDYN, KEMPFER (MRN [id number]) as of 08/21/2015 13:38  Ref. Range 08/17/2015 14:35 08/17/2015 14:36 08/18/2015 06:57  Sodium Latest Ref Range: 135-145 mmol/L 138    Potassium Latest Ref Range: 3.5-5.1 mmol/L 4.1    Chloride Latest Ref Range: 101-111 mmol/L 106    CO2 Latest Ref Range: 22-32 mmol/L 22    BUN Latest Ref Range: 6-20 mg/dL 13    Creatinine Latest Ref Range: 0.61-1.24 mg/dL 1.06    Calcium Latest Ref Range: 8.9-10.3 mg/dL 10.1    EGFR (Non-African Amer.) Latest Ref Range: >60 mL/min >60    EGFR (African American) Latest Ref Range: >60 mL/min >60    Glucose Latest Ref Range: 65-99 mg/dL 92    Anion gap Latest Ref Range: 5-15  10    Alkaline Phosphatase Latest Ref Range: 38-126 U/L 68    Albumin Latest Ref Range: 3.5-5.0 g/dL 5.3 (H)    AST Latest Ref Range: 15-41 U/L 24    ALT Latest Ref Range: 17-63 U/L 31    Total Protein Latest Ref Range: 6.5-8.1 g/dL 8.6 (H)    Total Bilirubin Latest Ref Range: 0.3-1.2 mg/dL 0.6    Cholesterol Latest Ref Range: 0-200 mg/dL   258 (H)  Triglycerides Latest Ref Range: <150 mg/dL   118  HDL Cholesterol Latest Ref Range: >40 mg/dL   36 (L)  LDL (calc) Latest Ref Range: 0-99 mg/dL   198 (H)  VLDL Latest Ref Range: 0-40 mg/dL   24  Total CHOL/HDL Ratio Latest Units: RATIO   7.2  WBC Latest Ref Range: 3.8-10.6 K/uL 10.1    RBC Latest Ref Range: 4.40-5.90 MIL/uL 5.12    Hemoglobin Latest Ref  Range: 13.0-18.0 g/dL 16.5    HCT Latest Ref Range: 40.0-52.0 % 48.1    MCV Latest Ref Range: 80.0-100.0 fL 94.1    MCH Latest Ref Range: 26.0-34.0 pg 32.3    MCHC Latest Ref Range: 32.0-36.0 g/dL 34.3    RDW Latest Ref Range: 11.5-14.5 % 13.6    Platelets Latest Ref Range: 150-440 K/uL 310    Neutrophils Latest Units: % 66    Lymphocytes Latest Units: %  21    Monocytes Relative Latest Units: % 11    Eosinophil Latest Units: % 1    Basophil Latest Units: % 1    NEUT# Latest Ref Range: 1.4-6.5 K/uL 6.8 (H)    Lymphocyte # Latest Ref Range: 1.0-3.6 K/uL 2.1    Monocyte # Latest Ref Range: 0.2-1.0 K/uL 1.1 (H)    Eosinophils Absolute Latest Ref Range: 0-0.7 K/uL 0.1    Basophils Absolute Latest Ref Range: 0-0.1 K/uL 0.0    Acetaminophen (Tylenol), S Latest Ref Range: 10-30 ug/mL  <85 (L)   Salicylate Lvl Latest Ref Range: 2.8-30.0 mg/dL  <4.0   Prolactin Latest Ref Range: 4.0-15.2 ng/mL   21.7 (H)  Hemoglobin A1C Latest Ref Range: 4.0-6.0 %   5.1  TSH Latest Ref Range: 0.350-4.500 uIU/mL   0.943  Appearance Latest Ref Range: CLEAR  CLEAR (A)    Bacteria, UA Latest Ref Range: NONE SEEN  NONE SEEN    Bilirubin Urine Latest Ref Range: NEGATIVE  NEGATIVE    Color, Urine Latest Ref Range: YELLOW  YELLOW (A)    Glucose Latest Ref Range: NEGATIVE mg/dL NEGATIVE    Hgb urine dipstick Latest Ref Range: NEGATIVE  NEGATIVE    Hyaline Casts, UA Unknown PRESENT    Ketones, ur Latest Ref Range: NEGATIVE mg/dL NEGATIVE    Leukocytes, UA Latest Ref Range: NEGATIVE  NEGATIVE    Mucous Unknown PRESENT    Nitrite Latest Ref Range: NEGATIVE  NEGATIVE    pH Latest Ref Range: 5.0-8.0  5.0    Protein Latest Ref Range: NEGATIVE mg/dL 30 (A)    RBC / HPF Latest Ref Range: 0-5 RBC/hpf 0-5    Specific Gravity, Urine Latest Ref Range: 1.005-1.030  1.020    Squamous Epithelial / LPF Latest Ref Range: NONE SEEN  NONE SEEN    WBC, UA Latest Ref Range: 0-5 WBC/hpf 0-5    Alcohol, Ethyl (B) Latest Ref Range: <5  mg/dL <5    Amphetamines, Ur Screen Latest Ref Range: NONE DETECTED  NONE DETECTED    Barbiturates, Ur Screen Latest Ref Range: NONE DETECTED  NONE DETECTED    Benzodiazepine, Ur Scrn Latest Ref Range: NONE DETECTED  NONE DETECTED    Cocaine Metabolite,Ur [place] Latest Ref Range: NONE DETECTED  NONE DETECTED    Methadone Scn, Ur Latest Ref Range: NONE DETECTED  NONE DETECTED    MDMA (Ecstasy)Ur Screen Latest Ref Range: NONE DETECTED  NONE DETECTED    Cannabinoid 50 Ng, Ur [place] Latest Ref Range: NONE DETECTED  NONE DETECTED    Opiate, Ur Screen Latest Ref Range: NONE DETECTED  NONE DETECTED    Phencyclidine (PCP) Ur S Latest Ref Range: NONE DETECTED  NONE DETECTED    Tricyclic, Ur Screen Latest Ref Range: NONE DETECTED  NONE DETECTED     See Psychiatric Specialty Exam and Suicide Risk Assessment completed by Attending Physician prior to discharge.  Discharge destination:  Home  Is patient on multiple antipsychotic therapies at discharge:  Yes,   Do you recommend tapering to monotherapy for antipsychotics?  Yes   Has Patient had three or more failed trials of antipsychotic monotherapy by history:  No  Recommended Plan for Multiple Antipsychotic Therapies: Taper to monotherapy as described:  gradually decrease oral invega     Medication List    STOP taking these medications        omeprazole 20 MG capsule  Commonly known as:  PRILOSEC      TAKE these medications  Indication   amantadine 100 MG capsule  Commonly known as:  SYMMETREL  Take 2 capsules (200 mg total) by mouth at bedtime.  Notes to Patient:  Plan and hyperprolactinemia and EPS      hydrOXYzine 50 MG capsule  Commonly known as:  VISTARIL  Take 1 capsule (50 mg total) by mouth at bedtime as needed (insomnia).  Notes to Patient:  His as needed for insomnia      paliperidone 234 MG/1.5ML Susp injection  Commonly known as:  INVEGA SUSTENNA  Inject 234 mg into the muscle once.  Start taking on:  09/18/2015  Notes to  Patient:  Schizophrenia      paliperidone 6 MG 24 hr tablet  Commonly known as:  INVEGA  Take 2 tablets (12 mg total) by mouth at bedtime.  Notes to Patient:  Schizophrenia        Follow-up Information    Go to [facility].   Why:  Please arrive to the walk-in clinic Monday through Friday from 8am for your hospital follow up for medication management and to be assessed for ACT Team services Your next scheduled appointment for therapy is July 21st. Please bring discharge papers.   Contact information:   Lethea Killings [address] [place], [place] [address] Phone: [phone] Fax: [phone]      Signed: Hildred Priest, MD 08/22/2015, 8:58 AM

## 2015-08-21 NOTE — Progress Notes (Signed)
D: Patient stated slept good last night .Stated appetite is good and energy level  Is normal. Stated concentration is good . Stated on Depression scale , hopeless and anxiety .( low 0-10 high) Denies suicidal  homicidal ideations  .  No auditory hallucinations  No pain concerns . Appropriate ADL'S. Interacting with peers and staff.  Patient  Voice of wanting to get back to recovery. Stated he wanted to participate  In group. A: Encourage patient participation with unit programming . Instruction  Given on  Medication , verbalize understanding. R: Voice no other concerns. Staff continue to monitor

## 2015-08-21 NOTE — Progress Notes (Signed)
D: Pt denies SI/HI/AVH. Pt is pleasant and cooperative, affect is flat but interacting appropriately no bizarre behavior or episode noted. Pt appears less anxious and he is interacting with peers and staff appropriately.  A: Pt was offered support and encouragement. Pt was given scheduled medications. Pt was encouraged to attend groups. Q 15 minute checks were done for safety.  R:Pt attends group and interacts well with peers and staff. Pt is taking medication. Pt has no complaints.Pt receptive to treatment and safety maintained on unit.

## 2015-08-21 NOTE — [facility] Group Notes (Signed)
[facility] Group Notes:  (Nursing/MHT/Case Management/Adjunct)  Date:  08/21/2015  Time:  4:12 PM  Type of Therapy:  Psychoeducational Skills  Participation Level:  Active  Participation Quality:  Appropriate, Attentive and Sharing  Affect:  Appropriate  Cognitive:  Alert and Appropriate  Insight:  Appropriate  Engagement in Group:  Engaged  Modes of Intervention:  Discussion, Education and Support  Summary of Progress/Problems:  Lynelle SmokeCara Travis [name] 08/21/2015, 4:12 PM

## 2015-08-21 NOTE — Progress Notes (Signed)
[facility] MD Progress Note  08/21/2015 9:22 AM Shane Allen  MRN:  [id number] Subjective:  Patient reports feeling better. He denies having any auditory or visual hallucinations anymore. He feels the Hinda Glatter is been causing him to feel dizzy. He says the dizziness was much worse yesterday but is mild today.  He denies problems with mood, appetite, energy or concentration. He has been is sleeping very well with the Restoril. He denies having any other complaints and denies  other side effects.  Nurse that the patient has been refusing amantadine because he says "I have no tremors".  Per nursing: D: Pt denies SI/HI/AVH. Pt is pleasant and cooperative, affect is flat but interacting appropriately no bizarre behavior or episode noted. Pt appears less anxious and he is interacting with peers and staff appropriately.  A: Pt was offered support and encouragement. Pt was given scheduled medications. Pt was encouraged to attend groups. Q 15 minute checks were done for safety.  R:Pt attends group and interacts well with peers and staff. Pt is taking medication. Pt has no complaints.Pt receptive to treatment and safety maintained on unit.   Principal Problem: Schizophrenia, unspecified (HCC) Diagnosis:   Patient Active Problem List   Diagnosis Date Noted  . Schizophrenia, unspecified (HCC) [F20.9] 08/17/2015  . Tobacco use disorder [F17.200] 10/27/2014   Total Time spent with patient: 30 minutes  Past Psychiatric History: Carries a diagnosis of schizophrenia was hospitalized in our unit back in 2016 for psychosis  Past Medical History:  Past Medical History  Diagnosis Date  . Schizo affective schizophrenia (HCC)   . 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    Past Surgical History  Procedure Laterality Date  . No past surgeries     Family History: History reviewed. No pertinent family history. Family Psychiatric  History:   Social  History:  History  Alcohol Use No    Comment: 4 to 5 beers a day every other week for sleep     History  Drug Use No    Social History   Social History  . Marital Status: Single    Spouse Name: N/A  . Number of Children: N/A  . Years of Education: N/A   Social History Main Topics  . Smoking status: Current Every Day Smoker -- 1.00 packs/day for 10 years    Types: Cigarettes  . Smokeless tobacco: Never Used  . Alcohol Use: No     Comment: 4 to 5 beers a day every other week for sleep  . Drug Use: No  . Sexual Activity: No   Other Topics Concern  . None   Social History Narrative   Additional Social History:    Pain Medications: denies Prescriptions: see PTA meds Over the Counter: denies History of alcohol / drug use?: No history of alcohol / drug abuse      Current Medications: Current Facility-Administered Medications  Medication Dose Route Frequency Provider Last Rate Last Dose  . amantadine (SYMMETREL) capsule 100 mg  100 mg Oral BID Shari Prows, MD   100 mg at 08/20/15 2240  . ibuprofen (ADVIL,MOTRIN) tablet 600 mg  600 mg Oral Q6H PRN Jolanta B Pucilowska, MD      . LORazepam (ATIVAN) tablet 1 mg  1 mg Oral Q4H PRN Jolanta B Pucilowska, MD   1 mg at 08/18/15 1020  . magnesium hydroxide (MILK OF MAGNESIA) suspension 30 mL  30 mL Oral Daily  PRN Shari ProwsJolanta B Pucilowska, MD      . nicotine (NICODERM CQ - dosed in mg/24 hours) patch 21 mg  21 mg Transdermal Q0600 Jolanta B Pucilowska, MD   21 mg at 08/21/15 0600  . [START ON 08/22/2015] paliperidone (INVEGA SUSTENNA) injection 156 mg  156 mg Intramuscular Once Jimmy FootmanAndrea Hernandez-Gonzalez, MD      . paliperidone (INVEGA) 24 hr tablet 12 mg  12 mg Oral Daily Jimmy FootmanAndrea Hernandez-Gonzalez, MD   12 mg at 08/20/15 0908  . temazepam (RESTORIL) capsule 15 mg  15 mg Oral QHS Jolanta B Pucilowska, MD   15 mg at 08/20/15 2240    Lab Results:  No results found for this or any previous visit (from the past 48 hour(s)).  Blood  Alcohol level:  Lab Results  Component Value Date   [facility]   ETH <5 10/26/2014    Metabolic Disorder Labs: Lab Results  Component Value Date   HGBA1C 5.1 08/18/2015   Lab Results  Component Value Date   PROLACTIN 21.7* 08/18/2015   Lab Results  Component Value Date   CHOL 258* 08/18/2015   TRIG 118 08/18/2015   HDL 36* 08/18/2015   CHOLHDL 7.2 08/18/2015   VLDL 24 08/18/2015   LDLCALC 198* 08/18/2015   LDLCALC 110* 10/26/2014    Physical Findings: AIMS: Facial and Oral Movements Muscles of Facial Expression: None, normal Lips and Perioral Area: None, normal Jaw: None, normal Tongue: None, normal,Extremity Movements Upper (arms, wrists, hands, fingers): None, normal Lower (legs, knees, ankles, toes): None, normal, Trunk Movements Neck, shoulders, hips: None, normal, Overall Severity Severity of abnormal movements (highest score from questions above): None, normal Incapacitation due to abnormal movements: None, normal Patient's awareness of abnormal movements (rate only patient's report): No Awareness, Dental Status Current problems with teeth and/or dentures?: No Does patient usually wear dentures?: No  CIWA:    COWS:     Musculoskeletal: Strength & Muscle Tone: within normal limits Gait & Station: normal Patient leans: N/A  Psychiatric Specialty Exam: Physical Exam  Constitutional: He is oriented to person, place, and time. He appears well-developed and well-nourished.  HENT:  Head: Normocephalic and atraumatic.  Eyes: EOM are normal.  Neck: Normal range of motion.  Respiratory: Effort normal.  Musculoskeletal: Normal range of motion.  Neurological: He is alert and oriented to person, place, and time.  Skin: Skin is dry.    Review of Systems  Constitutional: Negative.   HENT: Negative.   Eyes: Negative.   Respiratory: Negative.   Cardiovascular: Negative.   Gastrointestinal: Negative.   Genitourinary: Negative.   Musculoskeletal:  Negative.   Skin: Negative.   Neurological: Negative.   Endo/Heme/Allergies: Negative.   Psychiatric/Behavioral: Positive for hallucinations. Negative for depression, suicidal ideas, memory loss and substance abuse. The patient has insomnia. The patient is not nervous/anxious.     Blood pressure 116/82, pulse 77, temperature 97.8 F (36.6 C), temperature source Oral, resp. rate 18, height 5\' 9"  (1.753 m), weight 116.121 kg (256 lb), SpO2 99 %.Body mass index is 37.79 kg/(m^2).  General Appearance: Disheveled  Eye Contact:  Fair  Speech:  Clear and Coherent  Volume:  Decreased  Mood:  Dysphoric  Affect:  Constricted  Thought Process:  Linear and Descriptions of Associations: Intact  Orientation:  Full (Time, Place, and Person)  Thought Content:  Hallucinations: None  Suicidal Thoughts:  No  Homicidal Thoughts:  No  Memory:  Immediate;   Fair Recent;   Fair Remote;   Fair  Judgement:  Fair  Insight:  Fair  Psychomotor Activity:  Decreased  Concentration:  Concentration: Fair and Attention Span: Fair  Recall:  FiservFair  Fund of Knowledge:  Fair  Language:  Fair  Akathisia:  No  Handed:    AIMS (if indicated):     Assets:  Manufacturing systems engineerCommunication Skills Physical Health Social Support  ADL's:  Intact  Cognition:  WNL  Sleep:  Number of Hours: 7.15     Treatment Plan Summary:  Schizophrenia: The patient will be continued on Invega  12 mg.  Since the patient is been complaining of dizziness and medication will be changed from every morning 2 daily at bedtime. He received Invega Sustenna 234 mg on 6/25.  He will receive second injection of 156 mg on 6/29  EPS and hyperprolactinemia: He has been educated as to why that amantadine is prescribed. He's been advised to take the amantadine to prevent prolactin elevation. I will change amantadine to 200 mg daily at bedtime to increase compliance.  Prolactin is mildly elevated  Insomnia the patient will be continued on Restoril 15 mg by mouth daily  at bedtime--sleeping well  Tobacco use disorder continue nicotine patch at 21 mg a day  Diet regular  Vital signs daily---stable  Precautions every 15 minute checks  Hospitalization and status continue involuntary commitment  Labs: Hemoglobin A1c 5.1, TSH within the normal limits, lipid panel has been completed. Prolactin was mildly increased.  Disposition: Patient will return [place]. He has been following up with [facility].  Patient says he has a lawyer helping him apply for disability.  Possible d/c in 24-48 h  Jimmy FootmanHernandez-Gonzalez,  Teonna Coonan, MD 08/21/2015, 9:22 AM

## 2015-08-21 NOTE — Progress Notes (Signed)
Recreation Therapy Notes  Date: 06.28.17 Time: 9:30 am Location: Craft Room  Group Topic: Self-esteem  Goal Area(s) Addresses:  Patient will identify positive traits about self. Patient will verbalize benefit of having a healthy self-esteem.  Behavioral Response: Attetentive  Intervention: I Am  Activity: Patients were given a worksheet with the letter I on it and instructed to write as many positive traits inside the letter.  Education: LRT educated patients on ways they can increase their self-esteem.  Education Outcome: In group clarification offered   Clinical Observations/Feedback: Patient wrote positive traits. Patient did not contribute to group discussion.  Jacquelynn CreeGreene,Yoshito Gaza M, LRT/CTRS 08/21/2015 10:12 AM

## 2015-08-21 NOTE — [facility] Suicide Risk Assessment (Addendum)
[facility]   Principal Problem: Schizophrenia, unspecified [facility](HCC) Discharge Diagnoses:  Patient Active Problem List   Diagnosis Date Noted  . Schizophrenia, unspecified (HCC) [F20.9] 08/17/2015  . Tobacco use disorder [F17.200] 10/27/2014      Psychiatric Specialty Exam: ROS  Blood pressure 113/77, pulse 85, temperature 97.9 F (36.6 C), temperature source Oral, resp. rate 18, height 5\' 9"  (1.753 m), weight 116.121 kg (256 lb), SpO2 99 %.Body mass index is 37.79 kg/(m^2).                                                       Mental Status Per Nursing Assessment::   On Admission:  NA  Demographic Factors:  Male, Caucasian and Unemployed  Loss Factors: Decrease in vocational status  Historical Factors: NA  Risk Reduction Factors:   Positive social support  Continued Clinical Symptoms:  Schizophrenia:   Command hallucinatons Less than 31 years old Previous Psychiatric Diagnoses and Treatments  Cognitive Features That Contribute To Risk:  None    Suicide Risk:  Minimal: No identifiable suicidal ideation.  Patients presenting with no risk factors but with morbid ruminations; may be classified as minimal risk based on the severity of the depressive symptoms  Follow-up Information    [facility]   Why:  Please arrive to the walk-in clinic Monday through Friday from 8am for your hospital follow up for medication management and to be assessed for ACT Team services Your next scheduled appointment for therapy is July 21st. Please bring discharge papers.   Contact information:   [facility] [address] [place], [place] [address] Phone: [phone] Fax: [phone]       Jimmy FootmanHernandez-Gonzalez,  Reaghan Kawa, MD 08/22/2015, 8:52 AM

## 2015-08-21 NOTE — Progress Notes (Signed)
  [facility] :  Will you be returning to the same living situation after discharge:  Yes,  will return home with mother, [name]  At discharge, do you have transportation home?: Yes,  mother will pick pt up. Do you have the ability to pay for your medications: Yes,  [facility]  Release of information consent forms completed and in the chart;  Patient's signature needed at discharge.  Patient to Follow up at: Follow-up Information    [facility]   Why:  Please arrive to the walk-in clinic Monday through Friday from 8am for your hospital follow up for medication management and to be assessed for ACT Team services Your next scheduled appointment for therapy is July 21st. Please bring discharge papers.   Contact information:   [facility] [address] [place], [place] [address] Phone: [phone]) [phone] Fax: [phone]      Next level of care provider has access to [facility] Link:no  Safety Planning and Suicide Prevention discussed: Yes,  SPE completed with mother and patient.  Have you used any form of tobacco in the last 30 days? (Cigarettes, Smokeless Tobacco, Cigars, and/or Pipes): Yes  Has patient been referred to the Quitline?: Patient refused referral  Patient has been referred for addiction treatment: N/A  Lynden OxfordKadijah R Scout Guyett, LCSW-A 08/22/2015, 11:45am

## 2015-08-22 NOTE — Progress Notes (Signed)
D: Pt denies SI/HI/AVH. Pt is pleasant and cooperative appears less anxious no bizarre behavior noted, thoughts are more organized and logical. Pt is interacting with peers and staff appropriately.  A: Pt was offered support and encouragement. Pt was given scheduled medications. Pt was encouraged to attend groups. Q 15 minute checks were done for safety.  R:Pt attends groups and interacts well with peers and staff. Pt is taking medication. Pt has no complaints.Pt receptive to treatment and safety maintained on unit.

## 2015-08-22 NOTE — Progress Notes (Signed)
Patient to discharged at this time with Mother for transportation in private car. Patient acknowledges all belongings have been returned. Patient verbalizes understanding rt recommended discharge plan of care. Mother states patient does not need ACT team coming to her house. That she will "provided what patient needs". Social worker Jonathon aware and out to speak with patient. Safety maintained.

## 2015-08-22 NOTE — Progress Notes (Signed)
Patient with appropriate affect, cooperative behavior with meals, meds and plan of care. No SI/HI at this time. Therapy groups encouraged. Verbalized needs well. Patient to discharge today when discharge plan and transport in place. Safety maintained.

## 2015-08-22 NOTE — Progress Notes (Signed)
Recreation Therapy Notes  Date: 06.29.17 Time: 9:30 am Location: Craft Room  Group Topic: Leisure Education  Goal Area(s) Addresses:  Patient will identify activities for each letter of the alphabet. Patient will verbalize ability to use leisure as a coping skill.  Behavioral Response: Did not attend  Intervention: Leisure Alphabet  Activity: Patients were given a Leisure Alphabet worksheet and instructed to identify a leisure activity for each letter of the alphabet.  Education: LRT educated patient on what they need to participate in leisure.  Education Outcome: Patient did not attend group.  Clinical Observations/Feedback: Patient did not attend group.  Kaytlynn Kochan M, LRT/CTRS 08/22/2015 10:20 AM 

## 2015-08-22 NOTE — BHH Group Notes (Signed)
[facility] Group Notes:  (Nursing/MHT/Case Management/Adjunct)  Date:  08/22/2015  Time:  4:24 AM  Type of Therapy:  Group Therapy  Participation Level:  Active  Participation Quality:  Appropriate  Affect:  Appropriate  Cognitive:  Appropriate  Insight:  Appropriate and Improving  Engagement in Group:  Developing/Improving and Engaged  Modes of Intervention:  n/a  Summary of Progress/Problems:  Veva Holesshley Imani Marcquis Ridlon 08/22/2015, 4:24 AM

## 2015-11-01 ENCOUNTER — Emergency Department (HOSPITAL_COMMUNITY)
Admission: EM | Admit: 2015-11-01 | Discharge: 2015-11-01 | Disposition: A | Discharge status: Home / Self Care | Payer: Self-pay | Attending: Emergency Medicine | Admitting: Emergency Medicine

## 2015-11-01 ENCOUNTER — Encounter (HOSPITAL_COMMUNITY): Payer: Self-pay | Admitting: Emergency Medicine

## 2015-11-01 DIAGNOSIS — R232 Flushing: Secondary | ICD-10-CM | POA: Insufficient documentation

## 2015-11-01 DIAGNOSIS — F17210 Nicotine dependence, cigarettes, uncomplicated: Secondary | ICD-10-CM | POA: Insufficient documentation

## 2015-11-01 NOTE — ED Notes (Signed)
Patient verbalized understanding of discharge instructions and denies any further needs or questions at this time. VS stable. Patient ambulatory with steady gait.  

## 2015-11-01 NOTE — ED Triage Notes (Signed)
Pt st's he started a new psych med 1 1/2 weeks ago.  2 days ago he started having a reaction to it with redness and rash to face, neck and arms.  Pt st's he hasn't taken it in a couple of days and now feels like he is in withdrawals.  Pt c/o feeling very sluggish and weak.  Saphris is the name of the new med.

## 2015-11-01 NOTE — ED Notes (Signed)
Pt. States that he drank an energy drink prior to taking blood pressure and heart rate.   Shane Allen, NT

## 2015-11-01 NOTE — ED Provider Notes (Signed)
[facility]-EMERGENCY DEPT Provider Note   CSN: [id number] Arrival date & time: 11/01/15  1544   History   Chief Complaint Chief Complaint  Patient presents with  . Weakness    HPI Shane Allen is a 30 y.o. male.  The history is provided by the patient, a parent and medical records.  Rash   This is a new problem. The current episode started 6 to 12 hours ago. The problem has been gradually improving. The problem is associated with new medications (has been on new antipsychotic for about 1.5 wks (Saphris)). There has been no fever. The rash is present on the face, neck, right arm and left arm. The patient is experiencing no pain. Pertinent negatives include no itching. He has tried nothing for the symptoms. Risk factors include a change in medications and new medications.    Past Medical History:  Diagnosis Date  . 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  [name] affective schizophrenia [facility])     Patient Active Problem List   Diagnosis Date Noted  . Schizophrenia, unspecified (HCC) 08/17/2015  . Tobacco use disorder 10/27/2014    Past Surgical History:  Procedure Laterality Date  . NO PAST SURGERIES       Home Medications    Prior to Admission medications   Medication Sig Start Date End Date Taking? Authorizing Provider  asenapine (SAPHRIS) 5 MG SUBL 24 hr tablet Place 5 mg under the tongue daily as needed.   Yes Historical Provider, MD  DULoxetine (CYMBALTA) 30 MG capsule Take 30 mg by mouth daily.   Yes Historical Provider, MD  hydrOXYzine (VISTARIL) 50 MG capsule Take 1 capsule (50 mg total) by mouth at bedtime as needed (insomnia). 08/21/15  Yes Jimmy Footman, MD  amantadine (SYMMETREL) 100 MG capsule Take 2 capsules (200 mg total) by mouth at bedtime. Patient not taking: Reported on 11/01/2015 08/22/15   Jimmy Footman, MD  paliperidone (INVEGA SUSTENNA) 234 MG/1.5ML SUSP injection Inject 234 mg  into the muscle once. Patient not taking: Reported on 11/01/2015 09/18/15   Jimmy Footman, MD  paliperidone (INVEGA) 6 MG 24 hr tablet Take 2 tablets (12 mg total) by mouth at bedtime. Patient not taking: Reported on 11/01/2015 08/22/15   Jimmy Footman, MD    Family History No family history on file.  Social History Social History  Substance Use Topics  . Smoking status: Current Every Day Smoker    Packs/day: 1.00    Years: 10.00    Types: Cigarettes  . Smokeless tobacco: Never Used  . Alcohol use No     Comment: 4 to 5 beers a day every other week for sleep     Allergies   Haldol [haloperidol lactate]; Maalox [calcium carbonate antacid]; and Tylenol with codeine #3 [acetaminophen-codeine]   Review of Systems Review of Systems  Constitutional: Negative for fever.  HENT: Negative for facial swelling and trouble swallowing.   Respiratory: Negative for cough, shortness of breath and wheezing.   Cardiovascular: Negative for chest pain and palpitations.  Gastrointestinal: Negative for abdominal pain and vomiting.  Skin: Positive for color change and rash. Negative for itching.  Psychiatric/Behavioral: Negative for agitation.  All other systems reviewed and are negative.   Physical Exam Updated Vital Signs BP 126/91   Pulse 96   Temp 98 F (36.7 C) (Oral)   Resp 18   Ht 5\' 10"  (1.778 m)   Wt 122.5 kg   SpO2  97%   BMI 38.74 kg/m   Physical Exam  Constitutional: He appears well-developed and well-nourished.  HENT:  Head: Normocephalic and atraumatic.  No oral / tongue swelling   Eyes: Conjunctivae are normal.  Neck: Neck supple.  Cardiovascular: Normal rate and regular rhythm.   No murmur heard. Pulmonary/Chest: Effort normal and breath sounds normal. No respiratory distress. He has no wheezes.  Abdominal: Soft. There is no tenderness.  Musculoskeletal: He exhibits no edema.  Neurological: He is alert.  Skin: Skin is warm and dry. There is  erythema (flushed, slightly increased warmth to face and neck, upper extremities. no urticarial lesions, other rashes).  Psychiatric: He has a normal mood and affect.  Nursing note and vitals reviewed.   ED Treatments / Results  Labs (all labs ordered are listed, but only abnormal results are displayed) Labs Reviewed - No data to display  EKG  EKG Interpretation None       Radiology No results found.  Procedures Procedures (including critical care time)  Medications Ordered in ED Medications - No data to display   Initial Impression / Assessment and Plan / ED Course  I have reviewed the triage vital signs and the nursing notes.  Pertinent labs & imaging results that were available during my care of the patient were reviewed by me and considered in my medical decision making (see chart for details).  Clinical Course    30 yo M with history schizophrenia presenting with complaint of flushing and redness to face, neck, upper extremities in setting of starting new medication 1.5 wks ago. He stopped invega and started saphris. His last dose of saphris was this morning and rash began shortly after that. Symptoms are resolving now, with only some residual redness to affected areas. No hypertension, urticarial rash, nausea/vomiting, respiratory distress, oral swelling. No evidence of anaphylaxis. He is AF, HDS, well appearing aside from flushed skin. Patient would like to stop this medication. He has already called his psychiatrist to discuss medication changes. Advised discontinuation of this medication and close f/u with psychiatrist. Antihistamines PRN for skin. Patient verbalized understanding. Discharged in stable condition.   Case discussed with Dr. Clydene PughKnott who oversaw management of this patient.   Final Clinical Impressions(s) / ED Diagnoses   Final diagnoses:  Flushing    New Prescriptions Discharge Medication List as of 11/01/2015  9:52 PM       Urban GibsonJenny Eilyn Polack,  MD 11/02/15 [id number]    Lyndal Pulleyaniel Knott, MD 11/03/15 [id number]

## 2016-02-14 ENCOUNTER — Emergency Department (HOSPITAL_COMMUNITY)
Admission: EM | Admit: 2016-02-14 | Discharge: 2016-02-14 | Disposition: A | Discharge status: Home / Self Care | Payer: Self-pay | Attending: Emergency Medicine | Admitting: Emergency Medicine

## 2016-02-14 ENCOUNTER — Encounter (HOSPITAL_COMMUNITY): Payer: Self-pay | Admitting: Emergency Medicine

## 2016-02-14 DIAGNOSIS — K029 Dental caries, unspecified: Secondary | ICD-10-CM | POA: Insufficient documentation

## 2016-02-14 DIAGNOSIS — F17210 Nicotine dependence, cigarettes, uncomplicated: Secondary | ICD-10-CM | POA: Insufficient documentation

## 2016-02-14 DIAGNOSIS — Z79899 Other long term (current) drug therapy: Secondary | ICD-10-CM | POA: Insufficient documentation

## 2016-02-14 MED ORDER — PENICILLIN V POTASSIUM 500 MG PO TABS: 500.0000 mg | ORAL_TABLET | Freq: Three times a day (TID) | ORAL | 0 refills | Status: DC

## 2016-02-14 MED ORDER — OXYCODONE HCL 5 MG PO TABS
5.0000 mg | ORAL_TABLET | Freq: Once | ORAL | Status: AC
  Administered 2016-02-14: 5 mg via ORAL
  Filled 2016-02-14: qty 1

## 2016-02-14 MED ORDER — PENICILLIN V POTASSIUM 250 MG PO TABS
500.0000 mg | ORAL_TABLET | Freq: Once | ORAL | Status: AC
  Administered 2016-02-14: 500 mg via ORAL
  Filled 2016-02-14: qty 2

## 2016-02-14 MED ORDER — NAPROXEN 500 MG PO TABS: 500.0000 mg | ORAL_TABLET | Freq: Two times a day (BID) | ORAL | 0 refills | Status: DC

## 2016-02-14 NOTE — ED Triage Notes (Signed)
Pt presents to ED for assessment of dental and gum pain.  When asked where he says "every single one".  Pt sts he has been having pain x 1 week.  Pt sts he does not have the income to see a dentist.

## 2016-02-14 NOTE — ED Notes (Signed)
Pt understood dc material. Resources given for follow up care. NAD Noted

## 2016-02-14 NOTE — ED Provider Notes (Signed)
[facility]-EMERGENCY DEPT Provider Note   CSN: [id number] Arrival date & time: 02/14/16  1922   By signing my name below, I, Shane Allen, attest that this documentation has been prepared under the direction and in the presence of  Shane Mornavid Kriti Katayama, NP. Electronically Signed: Clovis PuAvnee Allen, ED Scribe. 02/14/16. 9:16 PM.  History   Chief Complaint Chief Complaint  Patient presents with  . Dental Pain   The history is provided by the patient. No language interpreter was used.  Dental Pain   This is a new problem. The current episode started more than 1 week ago. The problem occurs constantly. The problem has been gradually worsening. The pain is moderate. Treatments tried: hydrocodone  The treatment provided mild relief.   HPI Comments:  Shane EvensJonathan L Winningham is a 30 y.o. male who presents to the Emergency Department complaining of gradually worsening, persistent widespread dental pain x 1 week. Pt reports associated headache and gum pain. He has been taking hydrocodone with little relief. Pt denies fevers, chills, sore throat, difficulty swallowing, any other associated symptoms and modifying factors at this time. Pt is not followed by a dentist and does not have any insurance. Pt is a smoker. No known antibiotic allergies.    Past Medical History:  Diagnosis Date  . 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  Shane Allen. Schizo affective schizophrenia [facility](HCC)     Patient Active Problem List   Diagnosis Date Noted  . Schizophrenia, unspecified 08/17/2015  . Tobacco use disorder 10/27/2014    Past Surgical History:  Procedure Laterality Date  . NO PAST SURGERIES      Home Medications    Prior to Admission medications   Medication Sig Start Date End Date Taking? Authorizing Provider  amantadine (SYMMETREL) 100 MG capsule Take 2 capsules (200 mg total) by mouth at bedtime. Patient not taking: Reported on 11/01/2015 08/22/15   Jimmy FootmanAndrea Hernandez-Gonzalez, MD    asenapine (SAPHRIS) 5 MG SUBL 24 hr tablet Place 5 mg under the tongue daily as needed.    Historical Provider, MD  DULoxetine (CYMBALTA) 30 MG capsule Take 30 mg by mouth daily.    Historical Provider, MD  hydrOXYzine (VISTARIL) 50 MG capsule Take 1 capsule (50 mg total) by mouth at bedtime as needed (insomnia). 08/21/15   Jimmy FootmanAndrea Hernandez-Gonzalez, MD  paliperidone (INVEGA SUSTENNA) 234 MG/1.5ML SUSP injection Inject 234 mg into the muscle once. Patient not taking: Reported on 11/01/2015 09/18/15   Jimmy FootmanAndrea Hernandez-Gonzalez, MD  paliperidone (INVEGA) 6 MG 24 hr tablet Take 2 tablets (12 mg total) by mouth at bedtime. Patient not taking: Reported on 11/01/2015 08/22/15   Jimmy FootmanAndrea Hernandez-Gonzalez, MD    Family History History reviewed. No pertinent family history.  Social History Social History  Substance Use Topics  . Smoking status: Current Every Day Smoker    Packs/day: 1.00    Years: 10.00    Types: Cigarettes  . Smokeless tobacco: Never Used  . Alcohol use No     Comment: 4 to 5 beers a day every other week for sleep     Allergies   Haldol [haloperidol lactate]; Maalox [calcium carbonate antacid]; and Tylenol with codeine #3 [acetaminophen-codeine]   Review of Systems Review of Systems  Constitutional: Negative for chills and fever.  HENT: Positive for dental problem. Negative for sore throat and trouble swallowing.   Neurological: Positive for headaches.  All other systems reviewed and are negative.   Physical Exam Updated Vital  Signs BP 133/86 (BP Location: Left Arm)   Pulse 88   Temp 98.1 F (36.7 C) (Oral)   Resp 18   Ht 5\' 11"  (1.803 m)   Wt 270 lb (122.5 kg)   SpO2 97%   BMI 37.66 kg/m   Physical Exam  Constitutional: He is oriented to person, place, and time. He appears well-developed and well-nourished. No distress.  HENT:  Head: Normocephalic and atraumatic.  Mouth/Throat: Uvula is midline.  Widespread dental decay.   Eyes: Conjunctivae are normal.   Cardiovascular: Normal rate, regular rhythm and normal heart sounds.   Pulmonary/Chest: Effort normal.  Abdominal: He exhibits no distension.  Neurological: He is alert and oriented to person, place, and time.  Skin: Skin is warm and dry.  Psychiatric: He has a normal mood and affect.  Nursing note and vitals reviewed.  ED Treatments / Results  DIAGNOSTIC STUDIES:  Oxygen Saturation is 97% on RA, normal by my interpretation.    COORDINATION OF CARE:  9:10 PM Discussed treatment plan with pt at bedside and pt agreed to plan.  Labs (all labs ordered are listed, but only abnormal results are displayed) Labs Reviewed - No data to display  EKG  EKG Interpretation None       Radiology No results found.  Procedures Procedures (including critical care time)  Medications Ordered in ED Medications - No data to display   Initial Impression / Assessment and Plan / ED Course  I have reviewed the triage vital signs and the nursing notes.  Pertinent labs & imaging results that were available during my care of the patient were reviewed by me and considered in my medical decision making (see chart for details).  Clinical Course     Patient with dentalgia.  No abscess requiring immediate incision and drainage.  Exam not concerning for Ludwig's angina or pharyngeal abscess.  Will treat with penicillin. Pt instructed to follow-up with dentist.  Discussed return precautions. Pt safe for discharge.   Final Clinical Impressions(s) / ED Diagnoses   Final diagnoses:  Pain due to dental caries    New Prescriptions Discharge Medication List as of 02/14/2016  9:46 PM    START taking these medications   Details  naproxen (NAPROSYN) 500 MG tablet Take 1 tablet (500 mg total) by mouth 2 (two) times daily., Starting Fri 02/14/2016, Print    penicillin v potassium (VEETID) 500 MG tablet Take 1 tablet (500 mg total) by mouth 3 (three) times daily., Starting Fri 02/14/2016, Print        I personally performed the services described in this documentation, which was scribed in my presence. The recorded information has been reviewed and is accurate.     Shane Mornavid Luvenia Cranford, NP 02/14/16 2245    Marily MemosJason Mesner, MD 02/15/16 1130

## 2016-03-10 ENCOUNTER — Emergency Department (HOSPITAL_COMMUNITY): Payer: Self-pay

## 2016-03-10 ENCOUNTER — Emergency Department (HOSPITAL_COMMUNITY)
Admission: EM | Admit: 2016-03-10 | Discharge: 2016-03-10 | Disposition: A | Discharge status: Home / Self Care | Payer: Self-pay | Attending: Emergency Medicine | Admitting: Emergency Medicine

## 2016-03-10 ENCOUNTER — Encounter (HOSPITAL_COMMUNITY): Payer: Self-pay | Admitting: Emergency Medicine

## 2016-03-10 DIAGNOSIS — Y999 Unspecified external cause status: Secondary | ICD-10-CM | POA: Insufficient documentation

## 2016-03-10 DIAGNOSIS — F17210 Nicotine dependence, cigarettes, uncomplicated: Secondary | ICD-10-CM | POA: Insufficient documentation

## 2016-03-10 DIAGNOSIS — Z23 Encounter for immunization: Secondary | ICD-10-CM | POA: Insufficient documentation

## 2016-03-10 DIAGNOSIS — Y9389 Activity, other specified: Secondary | ICD-10-CM | POA: Insufficient documentation

## 2016-03-10 DIAGNOSIS — W260XXA Contact with knife, initial encounter: Secondary | ICD-10-CM | POA: Insufficient documentation

## 2016-03-10 DIAGNOSIS — S61223A Laceration with foreign body of left middle finger without damage to nail, initial encounter: Secondary | ICD-10-CM | POA: Insufficient documentation

## 2016-03-10 DIAGNOSIS — Y929 Unspecified place or not applicable: Secondary | ICD-10-CM | POA: Insufficient documentation

## 2016-03-10 MED ORDER — TETANUS-DIPHTH-ACELL PERTUSSIS 5-2.5-18.5 LF-MCG/0.5 IM SUSP
0.5000 mL | Freq: Once | INTRAMUSCULAR | Status: AC
  Administered 2016-03-10: 0.5 mL via INTRAMUSCULAR
  Filled 2016-03-10: qty 0.5

## 2016-03-10 MED ORDER — CEPHALEXIN 500 MG PO CAPS: 500.0000 mg | ORAL_CAPSULE | Freq: Three times a day (TID) | ORAL | 0 refills | Status: DC

## 2016-03-10 MED ORDER — LIDOCAINE HCL (PF) 1 % IJ SOLN
5.0000 mL | Freq: Once | INTRAMUSCULAR | Status: AC
  Administered 2016-03-10: 5 mL
  Filled 2016-03-10: qty 5

## 2016-03-10 NOTE — Discharge Instructions (Signed)
The information you requested on dentist on call is Alanson PulsWilliam Turner, DDS, [address], [place], [place] [phone]

## 2016-03-10 NOTE — ED Triage Notes (Signed)
Pt arrived to Ed via personal transportation. Pt cut finger (middle) finger on left hand with a steak knife. No c/o pain. Finger wrapped with dressing.

## 2016-03-10 NOTE — ED Provider Notes (Signed)
[facility]PT Provider Note   CSN: [id number] Arrival date & time: 03/10/16  1946  By signing my name below, I, Modena Jansky, attest that this documentation has been prepared under the direction and in the presence of non-physician practitioner, Kerrie Buffalo, NP. Electronically Signed: Modena Jansky, Scribe. 03/10/2016. 8:36 PM.  History   Chief Complaint Chief Complaint  Patient presents with  . Laceration   The history is provided by the patient. No language interpreter was used.  Laceration   The incident occurred 3 to 5 hours ago. The laceration is located on the left hand. The laceration mechanism was a a clean knife. The pain is moderate. The pain has been constant since onset.   HPI Comments: Shane Allen is a 31 y.o. male who presents to the Emergency Department complaining of left 3rd finger hand wound that occurred today. He states he was attempting to pry apart frozen hot dogs with a steak knife slipped and pierced his left 3rd finger. He reports associated constant moderate pain. He denies any other complaints.   Past Medical History:  Diagnosis Date  . 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  [name] affective schizophrenia [facility])     Patient Active Problem List   Diagnosis Date Noted  . Schizophrenia, unspecified 08/17/2015  . Tobacco use disorder 10/27/2014    Past Surgical History:  Procedure Laterality Date  . NO PAST SURGERIES         Home Medications    Prior to Admission medications   Medication Sig Start Date End Date Taking? Authorizing Provider  amantadine (SYMMETREL) 100 MG capsule Take 2 capsules (200 mg total) by mouth at bedtime. Patient not taking: Reported on 11/01/2015 08/22/15   Jimmy Footman, MD  asenapine (SAPHRIS) 5 MG SUBL 24 hr tablet Place 5 mg under the tongue daily as needed.    Historical Provider, MD  cephALEXin (KEFLEX) 500 MG capsule Take 1 capsule (500 mg  total) by mouth 3 (three) times daily. 03/10/16   Hope Orlene Och, NP  DULoxetine (CYMBALTA) 30 MG capsule Take 30 mg by mouth daily.    Historical Provider, MD  hydrOXYzine (VISTARIL) 50 MG capsule Take 1 capsule (50 mg total) by mouth at bedtime as needed (insomnia). 08/21/15   Jimmy Footman, MD  naproxen (NAPROSYN) 500 MG tablet Take 1 tablet (500 mg total) by mouth 2 (two) times daily. 02/14/16   Felicie Morn, NP  paliperidone (INVEGA SUSTENNA) 234 MG/1.5ML SUSP injection Inject 234 mg into the muscle once. Patient not taking: Reported on 11/01/2015 09/18/15   Jimmy Footman, MD  paliperidone (INVEGA) 6 MG 24 hr tablet Take 2 tablets (12 mg total) by mouth at bedtime. Patient not taking: Reported on 11/01/2015 08/22/15   Jimmy Footman, MD  penicillin v potassium (VEETID) 500 MG tablet Take 1 tablet (500 mg total) by mouth 3 (three) times daily. 02/14/16   Felicie Morn, NP    Family History History reviewed. No pertinent family history.  Social History Social History  Substance Use Topics  . Smoking status: Current Every Day Smoker    Packs/day: 1.00    Years: 10.00    Types: Cigarettes  . Smokeless tobacco: Never Used  . Alcohol use No     Comment: 4 to 5 beers a day every other week for sleep     Allergies   Haldol [haloperidol lactate]; Maalox [calcium carbonate antacid]; and Tylenol with codeine #3 [acetaminophen-codeine]  Review of Systems Review of Systems  Musculoskeletal: Positive for myalgias.  Skin: Positive for wound.     Physical Exam Updated Vital Signs BP 130/80 (BP Location: Right Arm)   Pulse 102   Temp 98.1 F (36.7 C) (Oral)   Resp 18   SpO2 98%   Physical Exam  Constitutional: He is oriented to person, place, and time. He appears well-developed and well-nourished. No distress.  HENT:  Head: Normocephalic and atraumatic.  Eyes: Conjunctivae are normal.  Neck: Neck supple.  Cardiovascular: Normal rate.   Pulmonary/Chest:  Effort normal.  Abdominal: Soft.  Musculoskeletal: Normal range of motion.  Neurological: He is alert and oriented to person, place, and time.  Full range of motion of the finger, good strength  Skin: Skin is warm and dry.  2 cm laceration at the DIP of the left middle finger.    Psychiatric: He has a normal mood and affect.  Nursing note and vitals reviewed.    ED Treatments / Results  DIAGNOSTIC STUDIES: Oxygen Saturation is 98% on RA, normal by my interpretation.    COORDINATION OF CARE: 8:40 PM- Pt advised of plan for treatment and pt agrees.  Labs (all labs ordered are listed, but only abnormal results are displayed) Labs Reviewed - No data to display   Radiology Dg Finger Middle Left  Result Date: 03/10/2016 CLINICAL DATA:  Pt c/o medial and posterior left middle finger laceration after cutting his finger with a steak knife while trying to cut frozen hot dogs out of ice tonight. No hx of prior injuries or surgeries to the area. EXAM: LEFT MIDDLE FINGER 2+V COMPARISON:  None. FINDINGS: Osseous alignment is normal. Bone mineralization is normal. No fracture line or displaced fracture fragment identified. Adjacent soft tissues are unremarkable. No foreign body appreciated. IMPRESSION: Negative. Electronically Signed   By: Bary Richard M.D.   On: 03/10/2016 21:24    Procedures Procedures (including critical care time) LACERATION REPAIR Performed by: Kerrie Buffalo, NP Consent: Verbal consent obtained. Risks and benefits: risks, benefits and alternatives were discussed Patient identity confirmed: provided demographic data Time out performed prior to procedure Prepped and Draped in normal sterile fashion Wound explored Laceration Location: Left 3rd finger Laceration Length: 2cm No Foreign Bodies seen or palpated Anesthesia: local infiltration Local anesthetic: lidocaine 1% without epinephrine Anesthetic total: 4 ml Irrigation method: syringe Amount of cleaning:  standard Number of sutures: 4 Technique:simple interrupted Suture: Proline. 4-0 Patient tolerance: Patient tolerated the procedure well with no immediate complications.  Medications Ordered in ED Medications  Tdap (BOOSTRIX) injection 0.5 mL (0.5 mLs Intramuscular Given 03/10/16 2108)  lidocaine (PF) (XYLOCAINE) 1 % injection 5 mL (5 mLs Infiltration Given 03/10/16 2150)     Initial Impression / Assessment and Plan / ED Course  I have reviewed the triage vital signs and the nursing notes.  Clinical Course     Tdap booster given.Pressure irrigation performed. Laceration occurred < 8 hours prior to repair which was well tolerated. Pt has no co morbidities to effect normal wound healing. Discussed suture home care w pt and answered questions. Pt to f-u for wound check and suture removal in 7 days. Pt is hemodynamically stable w no complaints prior to dc.   Final Clinical Impressions(s) / ED Diagnoses   Final diagnoses:  Laceration of left middle finger with foreign body without damage to nail, initial encounter    New Prescriptions New Prescriptions   CEPHALEXIN (KEFLEX) 500 MG CAPSULE    Take 1 capsule (500  mg total) by mouth 3 (three) times daily.   I personally performed the services described in this documentation, which was scribed in my presence. The recorded information has been reviewed and is accurate.     [address] DrascoM Neese, NP 03/14/16 [phone]    Canary Brimhristopher J Tegeler, MD 03/14/16 [phone]

## 2016-03-10 NOTE — ED Notes (Signed)
Patient transported to X-ray 

## 2016-03-10 NOTE — ED Notes (Signed)
Scanner in room not working.  

## 2016-03-27 ENCOUNTER — Emergency Department
Admission: EM | Admit: 2016-03-27 | Discharge: 2016-03-27 | Disposition: A | Discharge status: Home / Self Care | Payer: Self-pay | Attending: Emergency Medicine | Admitting: Emergency Medicine

## 2016-03-27 ENCOUNTER — Encounter: Payer: Self-pay | Admitting: Emergency Medicine

## 2016-03-27 DIAGNOSIS — F203 Undifferentiated schizophrenia: Secondary | ICD-10-CM

## 2016-03-27 DIAGNOSIS — Z91199 Patient's noncompliance with other medical treatment and regimen due to unspecified reason: Secondary | ICD-10-CM

## 2016-03-27 DIAGNOSIS — F209 Schizophrenia, unspecified: Secondary | ICD-10-CM | POA: Insufficient documentation

## 2016-03-27 DIAGNOSIS — Z9119 Patient's noncompliance with other medical treatment and regimen: Secondary | ICD-10-CM

## 2016-03-27 DIAGNOSIS — Z5181 Encounter for therapeutic drug level monitoring: Secondary | ICD-10-CM | POA: Insufficient documentation

## 2016-03-27 DIAGNOSIS — F17210 Nicotine dependence, cigarettes, uncomplicated: Secondary | ICD-10-CM | POA: Insufficient documentation

## 2016-03-27 LAB — URINE DRUG SCREEN, QUALITATIVE (ARMC ONLY)
AMPHETAMINES, UR SCREEN: NOT DETECTED
BARBITURATES, UR SCREEN: NOT DETECTED
BENZODIAZEPINE, UR SCRN: NOT DETECTED
Cannabinoid 50 Ng, Ur ~~LOC~~: NOT DETECTED
Cocaine Metabolite,Ur ~~LOC~~: NOT DETECTED
MDMA (Ecstasy)Ur Screen: NOT DETECTED
Methadone Scn, Ur: NOT DETECTED
OPIATE, UR SCREEN: NOT DETECTED
PHENCYCLIDINE (PCP) UR S: NOT DETECTED
Tricyclic, Ur Screen: NOT DETECTED

## 2016-03-27 LAB — CBC
HEMATOCRIT: 46.4 % (ref 40.0–52.0)
Hemoglobin: 16 g/dL (ref 13.0–18.0)
MCH: 31.8 pg (ref 26.0–34.0)
MCHC: 34.4 g/dL (ref 32.0–36.0)
MCV: 92.4 fL (ref 80.0–100.0)
Platelets: 367 10*3/uL (ref 150–440)
RBC: 5.02 MIL/uL (ref 4.40–5.90)
RDW: 13.3 % (ref 11.5–14.5)
WBC: 9.6 10*3/uL (ref 3.8–10.6)

## 2016-03-27 LAB — COMPREHENSIVE METABOLIC PANEL
ALT: 43 U/L (ref 17–63)
AST: 32 U/L (ref 15–41)
Albumin: 5.2 g/dL — ABNORMAL HIGH (ref 3.5–5.0)
Alkaline Phosphatase: 73 U/L (ref 38–126)
Anion gap: 9 (ref 5–15)
BUN: 8 mg/dL (ref 6–20)
CHLORIDE: 103 mmol/L (ref 101–111)
CO2: 25 mmol/L (ref 22–32)
CREATININE: 1.01 mg/dL (ref 0.61–1.24)
Calcium: 10.1 mg/dL (ref 8.9–10.3)
GFR calc Af Amer: >60 mL/min (ref >60)
GFR calc non Af Amer: >60 mL/min (ref >60)
Glucose, Bld: 108 mg/dL — ABNORMAL HIGH (ref 65–99)
Potassium: 4 mmol/L (ref 3.5–5.1)
Sodium: 137 mmol/L (ref 135–145)
Total Bilirubin: 0.7 mg/dL (ref 0.3–1.2)
Total Protein: 8.5 g/dL — ABNORMAL HIGH (ref 6.5–8.1)

## 2016-03-27 LAB — ETHANOL: Alcohol, Ethyl (B): <5 mg/dL (ref <5)

## 2016-03-27 MED ORDER — ASENAPINE MALEATE 5 MG SL SUBL: 5.0000 mg | SUBLINGUAL_TABLET | Freq: Two times a day (BID) | SUBLINGUAL | 0 refills | Status: DC

## 2016-03-27 NOTE — ED Notes (Signed)

## 2016-03-27 NOTE — Consult Note (Signed)
[facility] Consult   Reason for Consult:  Consult for 31 year old man with a history of schizophrenia who came voluntarily to the emergency room Referring Physician:  Jimmye Norman Patient Identification: Shane Allen MRN:  [id number] Principal Diagnosis: Schizophrenia [facility]) Diagnosis:   Patient Active Problem List   Diagnosis Date Noted  . Noncompliance [Z91.19] 03/27/2016  . Schizophrenia ([place]) [F20.9] 08/17/2015  . Tobacco use disorder [F17.200] 10/27/2014    Total Time spent with patient: 1 hour  Subjective:   Shane Allen is a 31 y.o. male patient admitted with "I'm having a problem with voices".  HPI:  Patient interviewed. Chart reviewed. 31 year old man with a history of schizophrenia. Comes to the emergency room saying he is having a problem with voices. He has been having auditory hallucinations that have been bothering him a lot for the last week. He is also not sleeping well which has been going on for a couple days. He says the voices make him feel agitated. They say a lot of negative things and get on his nerves. Patient denies however that he has been having any suicidal thoughts. He describes for me some efforts that he makes mentally to cope with the hallucinations. He is not abusing any drugs or alcohol. He tells me that he has been off of all psychiatric medicines for several months. Out been following up with any outpatient treatment but does say that he has an appointment to go to [facility] next week. Patient is requesting a prescription for Saphris.  Social history: Lives with his mother. Mostly stays around the house doing chores for her. Little other social life.  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 Denies that he is drinking heavily now. Denies other drug use.  Past Psychiatric History: Patient has a history of schizophrenia. He  has had previous psychiatric admissions including one admission here earlier this year. At that time he was treated with in [place] and was also on Saphris. Was documented that the in [place] was very helpful for his psychotic symptoms but the patient tells me that it made him feel bad. He also tells me he thinks it didn't work. Mother apparently has told staff that it made him too sedated so he was unable to do anything around the house. Patient is requesting to be back on Saphris. He also however says that Saphris he thinks that given him a rash last year.  Risk to Self: Suicidal Ideation: No Suicidal Intent: No Is patient at risk for suicide?: No Suicidal Plan?: No Access to Means: No What has been your use of drugs/alcohol within the last 12 months?: None How many times?: 0 Other Self Harm Risks: 0 Triggers for Past Attempts: Other (Comment) Intentional Self Injurious Behavior: None Risk to Others: Homicidal Ideation: No Thoughts of Harm to Others: No Current Homicidal Intent: No Current Homicidal Plan: No Access to Homicidal Means: No Identified Victim: n/a History of harm to others?: No Assessment of Violence: None Noted Violent Behavior Description: no Does patient have access to weapons?: No Criminal Charges Pending?: No Does patient have a court date: No Prior Inpatient Therapy: Prior Inpatient Therapy: Yes Prior Therapy Dates: multiple  Prior Therapy Facilty/Provider(s): [facility], [facility]  Reason for Treatment: Schizhophrenia  Prior Outpatient Therapy: Prior Outpatient Therapy: Yes Prior Therapy Dates: x5 months ago  Prior Therapy Facilty/Provider(s): [facility]  Reason for Treatment: Schizophrenia  Does patient have an ACCT  team?: No Does patient have Intensive In-House Services?  : No Does patient have [facility] services? : No Does patient have P4CC services?: No  Past Medical History:  Past Medical History:  Diagnosis Date  . 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  Mickle Allen affective schizophrenia [facility])     Past Surgical History:  Procedure Laterality Date  . NO PAST SURGERIES     Family History: No family history on file. Family Psychiatric  History: Patient is not aware of any family history of mental illness Social History:  History  Alcohol Use No    Comment: 4 to 5 beers a day every other week for sleep     History  Drug Use No    Social History   Social History  . Marital status: Single    Spouse name: N/A  . Number of children: N/A  . Years of education: N/A   Social History Main Topics  . Smoking status: Current Every Day Smoker    Packs/day: 1.00    Years: 10.00    Types: Cigarettes  . Smokeless tobacco: Never Used  . Alcohol use No     Comment: 4 to 5 beers a day every other week for sleep  . Drug use: No  . Sexual activity: No   Other Topics Concern  . None   Social History Narrative  . None   Additional Social History:    Allergies:   Allergies  Allergen Reactions  . Haldol [Haloperidol Lactate] Other (See Comments)    Uncontrolled muscle movement  . Maalox [Calcium Carbonate Antacid] Other (See Comments)    Thinks this medication makes his face droop, but it could be another medication.  . Tylenol With Codeine #3 [Acetaminophen-Codeine] Palpitations    Labs:  Results for orders placed or performed during the hospital encounter of 03/27/16 (from the past 48 hour(s))  Comprehensive metabolic panel     Status: Abnormal   Collection Time: 03/27/16  2:05 PM  Result Value Ref Range   Sodium 137 135 - 145 mmol/L   Potassium 4.0 3.5 - 5.1 mmol/L   Chloride 103 101 - 111 mmol/L   CO2 25 22 - 32 mmol/L   Glucose, Bld 108 (H) 65 - 99 mg/dL   BUN 8 6 - 20 mg/dL   Creatinine, Ser 1.01 0.61 - 1.24 mg/dL   Calcium 10.1 8.9 - 10.3 mg/dL   Total Protein 8.5 (H) 6.5 - 8.1 g/dL   Albumin 5.2 (H) 3.5 - 5.0 g/dL   AST 32 15 - 41 U/L   ALT 43 17 - 63 U/L   Alkaline  Phosphatase 73 38 - 126 U/L   Total Bilirubin 0.7 0.3 - 1.2 mg/dL   GFR calc non Af Amer >60 >60 mL/min   GFR calc Af Amer >60 >60 mL/min    Comment: (NOTE) The eGFR has been calculated using the CKD EPI equation. This calculation has not been validated in all clinical situations. eGFR's persistently <60 mL/min signify possible Chronic Kidney Disease.    Anion gap 9 5 - 15  Ethanol     Status: None   Collection Time: 03/27/16  2:05 PM  Result Value Ref Range   Alcohol, Ethyl (B) <5 <5 mg/dL    Comment:        LOWEST DETECTABLE LIMIT FOR SERUM ALCOHOL IS 5 mg/dL FOR MEDICAL PURPOSES ONLY   cbc  Status: None   Collection Time: 03/27/16  2:05 PM  Result Value Ref Range   WBC 9.6 3.8 - 10.6 K/uL   RBC 5.02 4.40 - 5.90 MIL/uL   Hemoglobin 16.0 13.0 - 18.0 g/dL   HCT 46.4 40.0 - 52.0 %   MCV 92.4 80.0 - 100.0 fL   MCH 31.8 26.0 - 34.0 pg   MCHC 34.4 32.0 - 36.0 g/dL   RDW 13.3 11.5 - 14.5 %   Platelets 367 150 - 440 K/uL  Urine Drug Screen, Qualitative     Status: None   Collection Time: 03/27/16  2:05 PM  Result Value Ref Range   Tricyclic, Ur Screen NONE DETECTED NONE DETECTED   Amphetamines, Ur Screen NONE DETECTED NONE DETECTED   MDMA (Ecstasy)Ur Screen NONE DETECTED NONE DETECTED   Cocaine Metabolite,Ur [place] NONE DETECTED NONE DETECTED   Opiate, Ur Screen NONE DETECTED NONE DETECTED   Phencyclidine (PCP) Ur S NONE DETECTED NONE DETECTED   Cannabinoid 50 Ng, Ur [place] NONE DETECTED NONE DETECTED   Barbiturates, Ur Screen NONE DETECTED NONE DETECTED   Benzodiazepine, Ur Scrn NONE DETECTED NONE DETECTED   Methadone Scn, Ur NONE DETECTED NONE DETECTED    Comment: (NOTE) 235  Tricyclics, urine               Cutoff 1000 ng/mL 200  Amphetamines, urine             Cutoff 1000 ng/mL 300  MDMA (Ecstasy), urine           Cutoff 500 ng/mL 400  Cocaine Metabolite, urine       Cutoff 300 ng/mL 500  Opiate, urine                   Cutoff 300 ng/mL 600  Phencyclidine (PCP), urine       Cutoff 25 ng/mL 700  Cannabinoid, urine              Cutoff 50 ng/mL 800  Barbiturates, urine             Cutoff 200 ng/mL 900  Benzodiazepine, urine           Cutoff 200 ng/mL 1000 Methadone, urine                Cutoff 300 ng/mL 1100 1200 The urine drug screen provides only a preliminary, unconfirmed 1300 analytical test result and should not be used for non-medical 1400 purposes. Clinical consideration and professional judgment should 1500 be applied to any positive drug screen result due to possible 1600 interfering substances. A more specific alternate chemical method 1700 must be used in order to obtain a confirmed analytical result.  1800 Gas chromato graphy / mass spectrometry (GC/MS) is the preferred 1900 confirmatory method.     No current facility-administered medications for this encounter.    Current Outpatient Prescriptions  Medication Sig Dispense Refill  . amantadine (SYMMETREL) 100 MG capsule Take 2 capsules (200 mg total) by mouth at bedtime. (Patient not taking: Reported on 11/01/2015) 60 capsule 0  . asenapine (SAPHRIS) 5 MG SUBL 24 hr tablet Place 5 mg under the tongue daily as needed.    [name] asenapine (SAPHRIS) 5 MG SUBL 24 hr tablet Place 1 tablet (5 mg total) under the tongue 2 (two) times daily. 60 tablet 0  . cephALEXin (KEFLEX) 500 MG capsule Take 1 capsule (500 mg total) by mouth 3 (three) times daily. 20 capsule 0  . DULoxetine (CYMBALTA) 30 MG capsule Take  30 mg by mouth daily.    . hydrOXYzine (VISTARIL) 50 MG capsule Take 1 capsule (50 mg total) by mouth at bedtime as needed (insomnia). 30 capsule 0  . naproxen (NAPROSYN) 500 MG tablet Take 1 tablet (500 mg total) by mouth 2 (two) times daily. 20 tablet 0  . paliperidone (INVEGA SUSTENNA) 234 MG/1.5ML SUSP injection Inject 234 mg into the muscle once. (Patient not taking: Reported on 11/01/2015) 0.9 mL 0  . paliperidone (INVEGA) 6 MG 24 hr tablet Take 2 tablets (12 mg total) by mouth at bedtime. (Patient not  taking: Reported on 11/01/2015) 60 tablet 0  . penicillin v potassium (VEETID) 500 MG tablet Take 1 tablet (500 mg total) by mouth 3 (three) times daily. 30 tablet 0    Musculoskeletal: Strength & Muscle Tone: within normal limits Gait & Station: normal Patient leans: N/A  Psychiatric Specialty Exam: Physical Exam  Nursing note and vitals reviewed. Constitutional: He appears well-developed and well-nourished.  HENT:  Head: Normocephalic and atraumatic.  Eyes: Conjunctivae are normal. Pupils are equal, round, and reactive to light.  Neck: Normal range of motion.  Cardiovascular: Regular rhythm and normal heart sounds.   Respiratory: Effort normal. No respiratory distress.  GI: Soft.  Musculoskeletal: Normal range of motion.  Neurological: He is alert.  Skin: Skin is warm and dry.  Psychiatric: His affect is blunt. His speech is delayed. He is slowed and actively hallucinating. Thought content is paranoid and delusional. Cognition and memory are normal. He expresses inappropriate judgment. He expresses no homicidal and no suicidal ideation.    Review of Systems  Constitutional: Negative.   HENT: Negative.   Eyes: Negative.   Respiratory: Negative.   Cardiovascular: Negative.   Gastrointestinal: Negative.   Musculoskeletal: Negative.   Skin: Negative.   Neurological: Negative.   Psychiatric/Behavioral: Positive for hallucinations. Negative for depression, substance abuse and suicidal ideas. The patient is nervous/anxious.     Blood pressure (!) 144/88, pulse 64, temperature 97.9 F (36.6 C), temperature source Oral, resp. rate 18, height _0  (1.753 m), weight 127 kg (280 lb), SpO2 98 %.Body mass index is 41.35 kg/m.  General Appearance: Casual  Eye Contact:  Fair  Speech:  Slow  Volume:  Decreased  Mood:  Dysphoric  Affect:  Constricted  Thought Process:  Goal Directed  Orientation:  Full (Time, Place, and Person)  Thought Content:  Delusions and Hallucinations: Auditory   Suicidal Thoughts:  No  Homicidal Thoughts:  No  Memory:  Immediate;   Good Recent;   Fair Remote;   Fair  Judgement:  Impaired  Insight:  Fair  Psychomotor Activity:  Decreased  Concentration:  Concentration: Fair  Recall:  [employer] of Knowledge:  Fair  Language:  Fair  Akathisia:  No  Handed:  Right  AIMS (if indicated):     Assets:  Communication Skills Desire for Improvement Financial Resources/Insurance Housing Physical Health Resilience Social Support  ADL's:  Intact  Cognition:  Impaired,  Mild  Sleep:        Treatment Plan Summary: Medication management and Plan 31 year old man with schizophrenia. No evidence or report of any suicidal ideation or dangerous behavior currently. Not agitated. Expressing a willingness to be cooperative with treatment. Patient does not meet commitment criteria and does not require inpatient treatment. He had come to the hospital requesting medication. I proposed to him that I give him a prescription for in [place]. Patient declined saying that he didn't think that medicine works. He specifically wants  a prescription for Saphris. I pointed out to him that if the Saphris really did cause him to have a rash then it is likely to happen again and he probably should choose a different antipsychotic. Patient expresses an understanding of that but still insists that he only wants to try the Saphris. In order to get him any kind of medication that we'll help I gave him a prescription for Saphris 5 mg twice a day sublingual. I told him again that he is at risk of developing a rash and that if a rash or other allergic reaction develops he needs to immediately stop the medication, take some Benadryl and see a doctor. He is to follow-up with his outpatient mental health provider next week. Patient otherwise can be released from the emergency room.  Disposition: Patient does not meet criteria for psychiatric inpatient admission. Supportive therapy provided  about ongoing stressors.  Alethia Berthold, MD 03/27/2016 6:51 PM

## 2016-03-27 NOTE — ED Notes (Signed)
BEHAVIORAL HEALTH ROUNDING Patient sleeping: No. Patient alert and oriented: yes Behavior appropriate: Yes.  ; If no, describe:  Nutrition and fluids offered: yes Toileting and hygiene offered: Yes  Sitter present: q15 minute observations and security  monitoring Law enforcement present: Yes  ODS  

## 2016-03-27 NOTE — ED Notes (Addendum)
Please contact his mother  - Nathanial MillmanDoris Villanueva with updates  [phone]   Mother reports that he pt has taken invega injectrions in the past but they made him into a "zombie"  saphris gave him edema in his face and neck  abilify works but he just does not take his medicine every day like he should

## 2016-03-27 NOTE — ED Provider Notes (Signed)
[facility] Emergency Department Provider Note  ____________________________________________   I have reviewed the triage vital signs and the nursing notes.   HISTORY  Chief Complaint Psychiatric Evaluation    HPI Shane Allen is a 31 y.o. male who presents today complaining of hearing voices. Patient has he states a history of schizophrenia. He has no SI or HI he has no thoughts of hurting himself or anyone else. He is not taking any medications. He denies any other complaints. Denies headache or stiff neck chest pain shortness breath nausea vomiting abdominal pain he has had no ingestion of medications.     Past Medical History:  Diagnosis Date  . 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  [name] affective schizophrenia [facility])     Patient Active Problem List   Diagnosis Date Noted  . Schizophrenia, unspecified 08/17/2015  . Tobacco use disorder 10/27/2014    Past Surgical History:  Procedure Laterality Date  . NO PAST SURGERIES      Prior to Admission medications   Medication Sig Start Date End Date Taking? Authorizing Provider  amantadine (SYMMETREL) 100 MG capsule Take 2 capsules (200 mg total) by mouth at bedtime. Patient not taking: Reported on 11/01/2015 08/22/15   Jimmy Footman, MD  asenapine (SAPHRIS) 5 MG SUBL 24 hr tablet Place 5 mg under the tongue daily as needed.    Historical Provider, MD  cephALEXin (KEFLEX) 500 MG capsule Take 1 capsule (500 mg total) by mouth 3 (three) times daily. 03/10/16   Hope Orlene Och, NP  DULoxetine (CYMBALTA) 30 MG capsule Take 30 mg by mouth daily.    Historical Provider, MD  hydrOXYzine (VISTARIL) 50 MG capsule Take 1 capsule (50 mg total) by mouth at bedtime as needed (insomnia). 08/21/15   Jimmy Footman, MD  naproxen (NAPROSYN) 500 MG tablet Take 1 tablet (500 mg total) by mouth 2 (two) times daily. 02/14/16   Felicie Morn, NP   paliperidone (INVEGA SUSTENNA) 234 MG/1.5ML SUSP injection Inject 234 mg into the muscle once. Patient not taking: Reported on 11/01/2015 09/18/15   Jimmy Footman, MD  paliperidone (INVEGA) 6 MG 24 hr tablet Take 2 tablets (12 mg total) by mouth at bedtime. Patient not taking: Reported on 11/01/2015 08/22/15   Jimmy Footman, MD  penicillin v potassium (VEETID) 500 MG tablet Take 1 tablet (500 mg total) by mouth 3 (three) times daily. 02/14/16   Felicie Morn, NP    Allergies Haldol [haloperidol lactate]; Maalox [calcium carbonate antacid]; and Tylenol with codeine #3 [acetaminophen-codeine]  No family history on file.  Social History Social History  Substance Use Topics  . Smoking status: Current Every Day Smoker    Packs/day: 1.00    Years: 10.00    Types: Cigarettes  . Smokeless tobacco: Never Used  . Alcohol use No     Comment: 4 to 5 beers a day every other week for sleep    Review of Systems Constitutional: No fever/chills Eyes: No visual changes. ENT: No sore throat. No stiff neck no neck pain Cardiovascular: Denies chest pain. Respiratory: Denies shortness of breath. Gastrointestinal:   no vomiting.  No diarrhea.  No constipation. Genitourinary: Negative for dysuria. Musculoskeletal: Negative lower extremity swelling Skin: Negative for rash. Neurological: Negative for severe headaches, focal weakness or numbness. 10-point ROS otherwise negative.  ____________________________________________   PHYSICAL EXAM:  VITAL SIGNS: ED Triage Vitals  Enc Vitals Group     BP  03/27/16 1336 (!) 153/94     Pulse Rate 03/27/16 1336 96     Resp 03/27/16 1336 20     Temp 03/27/16 1336 97.9 F (36.6 C)     Temp Source 03/27/16 1336 Oral     SpO2 03/27/16 1336 98 %     Weight 03/27/16 1336 280 lb (127 kg)     Height 03/27/16 1336 5\' 9"  (1.753 m)     Head Circumference --      Peak Flow --      Pain Score 03/27/16 1403 0     Pain Loc --      Pain Edu? --       Excl. in GC? --     Constitutional: Alert and oriented. Well appearing and in no acute distress. Eyes: Conjunctivae are normal. PERRL. EOMI. Head: Atraumatic. Nose: No congestion/rhinnorhea. Mouth/Throat: Mucous membranes are moist.  Oropharynx non-erythematous. Neck: No stridor.   Nontender with no meningismus Cardiovascular: Normal rate, regular rhythm. Grossly normal heart sounds.  Good peripheral circulation. Respiratory: Normal respiratory effort.  No retractions. Lungs CTAB. Abdominal: Soft and nontender. No distention. No guarding no rebound Back:  There is no focal tenderness or step off.  there is no midline tenderness there are no lesions noted. there is no CVA tenderness Musculoskeletal: No lower extremity tenderness, no upper extremity tenderness. No joint effusions, no DVT signs strong distal pulses no edema Neurologic:  Normal speech and language. No gross focal neurologic deficits are appreciated.  Skin:  Skin is warm, dry and intact. No rash noted. Psychiatric: Mood and affect are sightly bizarre. Speech and behavior are normal.  ____________________________________________   LABS (all labs ordered are listed, but only abnormal results are displayed)  Labs Reviewed  COMPREHENSIVE METABOLIC PANEL - Abnormal; Notable for the following:       Result Value   Glucose, Bld 108 (*)    Total Protein 8.5 (*)    Albumin 5.2 (*)    All other components within normal limits  CBC  ETHANOL  URINE DRUG SCREEN, QUALITATIVE ([facility] ONLY)   ____________________________________________  EKG  I personally interpreted any EKGs ordered by me or triage  ____________________________________________  RADIOLOGY  I reviewed any imaging ordered by me or triage that were performed during my shift and, if possible, patient and/or family made aware of any abnormal findings. ____________________________________________   PROCEDURES  Procedure(s) performed:  None  Procedures  Critical Care performed: None  ____________________________________________   INITIAL IMPRESSION / ASSESSMENT AND PLAN / ED COURSE  Pertinent labs & imaging results that were available during my care of the patient were reviewed by me and considered in my medical decision making (see chart for details).  Patient here for help with hearing voices. Does not make her for acute psychiatric inpatient IVC however we will see if psychiatry and TTS can help him.    ____________________________________________   FINAL CLINICAL IMPRESSION(S) / ED DIAGNOSES  Final diagnoses:  None      This chart was dictated using voice recognition software.  Despite best efforts to proofread,  errors can occur which can change meaning.      Jeanmarie PlantJames A Kemaria Dedic, MD 03/27/16 1452
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
## 2016-03-27 NOTE — ED Triage Notes (Addendum)
Pt reports hearing voices for approximately one week. Pt states voices and images are keeping him up at night. Pt reports he has taken medication for psychosis in the past but has not had any lately. Pt is voluntary. Denies SI/HI

## 2016-03-31 ENCOUNTER — Encounter: Payer: Self-pay | Admitting: Emergency Medicine

## 2016-03-31 ENCOUNTER — Emergency Department
Admission: EM | Admit: 2016-03-31 | Discharge: 2016-03-31 | Disposition: A | Discharge status: Home / Self Care | Payer: Self-pay | Attending: Emergency Medicine | Admitting: Emergency Medicine

## 2016-03-31 ENCOUNTER — Inpatient Hospital Stay: Admission: EM | Admit: 2016-03-31 | Payer: Self-pay | Source: Intra-hospital | Admitting: Psychiatry

## 2016-03-31 DIAGNOSIS — F209 Schizophrenia, unspecified: Secondary | ICD-10-CM | POA: Insufficient documentation

## 2016-03-31 DIAGNOSIS — F17210 Nicotine dependence, cigarettes, uncomplicated: Secondary | ICD-10-CM | POA: Insufficient documentation

## 2016-03-31 DIAGNOSIS — Z91199 Patient's noncompliance with other medical treatment and regimen due to unspecified reason: Secondary | ICD-10-CM

## 2016-03-31 DIAGNOSIS — Z9119 Patient's noncompliance with other medical treatment and regimen: Secondary | ICD-10-CM

## 2016-03-31 DIAGNOSIS — F203 Undifferentiated schizophrenia: Secondary | ICD-10-CM

## 2016-03-31 DIAGNOSIS — R440 Auditory hallucinations: Secondary | ICD-10-CM

## 2016-03-31 DIAGNOSIS — Z79899 Other long term (current) drug therapy: Secondary | ICD-10-CM | POA: Insufficient documentation

## 2016-03-31 LAB — ACETAMINOPHEN LEVEL

## 2016-03-31 LAB — COMPREHENSIVE METABOLIC PANEL
ALT: 37 U/L (ref 17–63)
ANION GAP: 8 (ref 5–15)
AST: 23 U/L (ref 15–41)
Albumin: 4.4 g/dL (ref 3.5–5.0)
Alkaline Phosphatase: 76 U/L (ref 38–126)
BILIRUBIN TOTAL: 0.3 mg/dL (ref 0.3–1.2)
BUN: 13 mg/dL (ref 6–20)
CO2: 27 mmol/L (ref 22–32)
Calcium: 9.4 mg/dL (ref 8.9–10.3)
Chloride: 104 mmol/L (ref 101–111)
Creatinine, Ser: 1.04 mg/dL (ref 0.61–1.24)
GFR calc Af Amer: >60 mL/min (ref >60)
Glucose, Bld: 121 mg/dL — ABNORMAL HIGH (ref 65–99)
POTASSIUM: 3.9 mmol/L (ref 3.5–5.1)
Sodium: 139 mmol/L (ref 135–145)
TOTAL PROTEIN: 7.7 g/dL (ref 6.5–8.1)

## 2016-03-31 LAB — URINE DRUG SCREEN, QUALITATIVE (ARMC ONLY)
Amphetamines, Ur Screen: NOT DETECTED
BENZODIAZEPINE, UR SCRN: NOT DETECTED
Barbiturates, Ur Screen: NOT DETECTED
CANNABINOID 50 NG, UR ~~LOC~~: NOT DETECTED
Cocaine Metabolite,Ur ~~LOC~~: NOT DETECTED
MDMA (ECSTASY) UR SCREEN: NOT DETECTED
Methadone Scn, Ur: NOT DETECTED
Opiate, Ur Screen: NOT DETECTED
PHENCYCLIDINE (PCP) UR S: NOT DETECTED
Tricyclic, Ur Screen: NOT DETECTED

## 2016-03-31 LAB — CBC
HCT: 41.5 % (ref 40.0–52.0)
Hemoglobin: 14.7 g/dL (ref 13.0–18.0)
MCH: 32.5 pg (ref 26.0–34.0)
MCHC: 35.3 g/dL (ref 32.0–36.0)
MCV: 92.1 fL (ref 80.0–100.0)
PLATELETS: 346 10*3/uL (ref 150–440)
RBC: 4.51 MIL/uL (ref 4.40–5.90)
RDW: 13 % (ref 11.5–14.5)
WBC: 10.9 10*3/uL — AB (ref 3.8–10.6)

## 2016-03-31 LAB — ETHANOL

## 2016-03-31 LAB — SALICYLATE LEVEL

## 2016-03-31 MED ORDER — ASENAPINE MALEATE 5 MG SL SUBL
5.0000 mg | SUBLINGUAL_TABLET | Freq: Two times a day (BID) | SUBLINGUAL | Status: DC
Start: 2016-03-31 — End: 2016-04-01
  Administered 2016-03-31: 5 mg via SUBLINGUAL
  Filled 2016-03-31 (×2): qty 1

## 2016-03-31 MED ORDER — ASENAPINE MALEATE 5 MG SL SUBL
10.0000 mg | SUBLINGUAL_TABLET | Freq: Once | SUBLINGUAL | Status: AC
  Administered 2016-03-31: 10 mg via SUBLINGUAL
  Filled 2016-03-31: qty 2

## 2016-03-31 NOTE — ED Notes (Signed)
Patient unable to reach mother for a ride. Writer left message for mom to call me in the [facility].

## 2016-03-31 NOTE — ED Triage Notes (Signed)
Pt ambulatory to triage with steady gait, pt reports hearing voices and seeing "spirits" pt reports voices became worse tonight. Denies HI/SI. Pt states hearing screams which has been keeping him up at night. Pt lives at home with his mother. Pt brought in by police. Pt is VOLUNTARY.Pt reports taking schizophrenia meds today prior to that it had been 5 months that pt did not take meds because "I thought I could just kick it, do it myself."  Pt calm and cooperative.

## 2016-03-31 NOTE — ED Provider Notes (Signed)
[facility] Emergency Department Provider Note   ____________________________________________   First MD Initiated Contact with Patient 03/31/16 [phone]     (approximate)  I have reviewed the triage vital signs and the nursing notes.   HISTORY  Chief Complaint Psychiatric Evaluation    HPI Shane Allen is a 31 y.o. male who comes into the hospital today hearing voices. The patient has a history of schizophrenia and has not been taking his medications. He reports that he's been hearing demons and monitored for cyst. He's hearing screens and equal spirits. He reports that this started about a week ago. The patient was seen here on the second and was started on a new medication. He said that he took the medication yesterday but it has not stopped the voices. The patient reports that have been 5 months before this that he's taken any medications. He denies any suicidal or homicidal ideation. The patient denies using drugs or drinking alcohol He reports that the voices have been keeping him awake and he hasn't slept in days. The patient is here today for evaluation of his symptoms.   Past Medical History:  Diagnosis Date  . 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  [name]. Schizo affective schizophrenia [facility](HCC)     Patient Active Problem List   Diagnosis Date Noted  . Noncompliance 03/27/2016  . Schizophrenia (HCC) 08/17/2015  . Tobacco use disorder 10/27/2014    Past Surgical History:  Procedure Laterality Date  . NO PAST SURGERIES      Prior to Admission medications   Medication Sig Start Date End Date Taking? Authorizing Provider  asenapine (SAPHRIS) 5 MG SUBL 24 hr tablet Place 1 tablet (5 mg total) under the tongue 2 (two) times daily. 03/27/16  Yes Audery AmelJohn T Clapacs, MD  amantadine (SYMMETREL) 100 MG capsule Take 2 capsules (200 mg total) by mouth at bedtime. Patient not taking: Reported on 11/01/2015  08/22/15   Jimmy FootmanAndrea Hernandez-Gonzalez, MD  cephALEXin (KEFLEX) 500 MG capsule Take 1 capsule (500 mg total) by mouth 3 (three) times daily. Patient not taking: Reported on 03/31/2016 03/10/16   Janne NapoleonHope M Neese, NP  hydrOXYzine (VISTARIL) 50 MG capsule Take 1 capsule (50 mg total) by mouth at bedtime as needed (insomnia). Patient not taking: Reported on 03/31/2016 08/21/15   Jimmy FootmanAndrea Hernandez-Gonzalez, MD  naproxen (NAPROSYN) 500 MG tablet Take 1 tablet (500 mg total) by mouth 2 (two) times daily. Patient not taking: Reported on 03/31/2016 02/14/16   Felicie Mornavid Smith, NP  paliperidone (INVEGA SUSTENNA) 234 MG/1.5ML SUSP injection Inject 234 mg into the muscle once. Patient not taking: Reported on 11/01/2015 09/18/15   Jimmy FootmanAndrea Hernandez-Gonzalez, MD  paliperidone (INVEGA) 6 MG 24 hr tablet Take 2 tablets (12 mg total) by mouth at bedtime. Patient not taking: Reported on 11/01/2015 08/22/15   Jimmy FootmanAndrea Hernandez-Gonzalez, MD  penicillin v potassium (VEETID) 500 MG tablet Take 1 tablet (500 mg total) by mouth 3 (three) times daily. Patient not taking: Reported on 03/31/2016 02/14/16   Felicie Mornavid Smith, NP    Allergies Haldol [haloperidol lactate]; Maalox [calcium carbonate antacid]; and Tylenol with codeine #3 [acetaminophen-codeine]  No family history on file.  Social History Social History  Substance Use Topics  . Smoking status: Current Every Day Smoker    Packs/day: 1.00    Years: 10.00    Types: Cigarettes  . Smokeless tobacco: Never Used  . Alcohol use No     Comment:  4 to 5 beers a day every other week for sleep    Review of Systems Constitutional: No fever/chills Eyes: No visual changes. ENT: No sore throat. Cardiovascular: Denies chest pain. Respiratory: Denies shortness of breath. Gastrointestinal: No abdominal pain.  No nausea, no vomiting.  No diarrhea.  No constipation. Genitourinary: Negative for dysuria. Musculoskeletal: Negative for back pain. Skin: Negative for rash. Neurological: Negative for  headaches, focal weakness or numbness.  10-point ROS otherwise negative.  ____________________________________________   PHYSICAL EXAM:  VITAL SIGNS: ED Triage Vitals  Enc Vitals Group     BP 03/31/16 0155 (!) 141/95     Pulse Rate 03/31/16 0155 91     Resp 03/31/16 0155 18     Temp 03/31/16 0155 97.9 F (36.6 C)     Temp Source 03/31/16 0155 Oral     SpO2 03/31/16 0155 97 %     Weight 03/31/16 0156 280 lb (127 kg)     Height 03/31/16 0156 5\' 11"  (1.803 m)     Head Circumference --      Peak Flow --      Pain Score --      Pain Loc --      Pain Edu? --      Excl. in GC? --     Constitutional: Alert and oriented. Well appearing and in no acute distress. Eyes: Conjunctivae are normal. PERRL. EOMI. Head: Atraumatic. Nose: No congestion/rhinnorhea. Mouth/Throat: Mucous membranes are moist.  Oropharynx non-erythematous. Cardiovascular: Normal rate, regular rhythm. Grossly normal heart sounds.  Good peripheral circulation. Respiratory: Normal respiratory effort.  No retractions. Lungs CTAB. Gastrointestinal: Soft and nontender. No distention.  Positive bowel sounds Musculoskeletal: No lower extremity tenderness nor edema.   Neurologic:  Normal speech and language.  Skin:  Skin is warm, dry and intact.  Psychiatric: Mood and affect are normal.  ____________________________________________   LABS (all labs ordered are listed, but only abnormal results are displayed)  Labs Reviewed  COMPREHENSIVE METABOLIC PANEL - Abnormal; Notable for the following:       Result Value   Glucose, Bld 121 (*)    All other components within normal limits  ACETAMINOPHEN LEVEL - Abnormal; Notable for the following:    Acetaminophen (Tylenol), Serum <10 (*)    All other components within normal limits  CBC - Abnormal; Notable for the following:    WBC 10.9 (*)    All other components within normal limits  ETHANOL  SALICYLATE LEVEL  URINE DRUG SCREEN, QUALITATIVE ([facility] ONLY)    ____________________________________________  EKG  none ____________________________________________  RADIOLOGY  none ____________________________________________   PROCEDURES  Procedure(s) performed: None  Procedures  Critical Care performed: No  ____________________________________________   INITIAL IMPRESSION / ASSESSMENT AND PLAN / ED COURSE  Pertinent labs & imaging results that were available during my care of the patient were reviewed by me and considered in my medical decision making (see chart for details).  This is a 31 year old male who comes into the hospital today hearing voices. The patient is a history of schizophrenia. He was started on the medication recently but reports that he's only taken one dose and is not helping. The patient is back here today for evaluation.  The patient will be seen by psych.     ____________________________________________   FINAL CLINICAL IMPRESSION(S) / ED DIAGNOSES  Final diagnoses:  Auditory hallucinations      NEW MEDICATIONS STARTED DURING THIS VISIT:  New Prescriptions   No medications on file  Note:  This document was prepared using Dragon voice recognition software and may include unintentional dictation errors.    Rebecka Apley, MD 03/31/16 [phone]

## 2016-03-31 NOTE — ED Notes (Addendum)
Pt states that he has an appointment tomorrow with [facility] and would like to be discharge so that he may follow up with them as an out patient. Pt is alert and oriented and denies SI /HI and AVH at this time. AVH not present and he is taking his medications as prescribed, although he was not prior to admission. Pt states that he will comply with medication regimen at home.  MD met with patient and decided to discharge home.

## 2016-03-31 NOTE — ED Notes (Signed)
EDP made aware of patient wanting to be discharged.

## 2016-03-31 NOTE — Consult Note (Signed)
[facility] Consult   Reason for Consult:  Consult for 31 year old man with a history of schizophrenia who presents voluntarily seeking treatment for psychosis Referring Physician:  Corky Downs Patient Identification: Shane Allen MRN:  [id number] Principal Diagnosis: Schizophrenia [facility]) Diagnosis:   Patient Active Problem List   Diagnosis Date Noted  . Noncompliance [Z91.19] 03/27/2016  . Schizophrenia ([place]) [F20.9] 08/17/2015  . Tobacco use disorder [F17.200] 10/27/2014    Total Time spent with patient: 1 hour  Subjective:   Shane Allen is a 31 y.o. male patient admitted with "the voices are bad".  HPI:  Patient interviewed. Chart reviewed. 31 year old man with a history of schizophrenia. He was here in the emergency room just a few days ago complaining of his hallucinations but at that time did not meet commitment criteria and just wanted to restart medication. He returns saying that since then the voices have continued and have gotten worse. They are bothering him constantly. Say ugly things to him and frightening him. He is not sleeping well. Feels nervous and anxious. Feels confused. He says that he did get the prescription filled for Saphris that I gave him the other day and has taken it for one day. Denies alcohol or drug abuse. Prior to that he had been noncompliant with antipsychotic medication probably for several months.  Social history: Lives with his mother. Doesn't do very much except putter around the house. Minimal social contact.  Medical history: No significant medical history outside of his schizophrenia  Substance abuse history: History of abuse of alcohol but he says he has not been drinking at all recently and denies other drug abuse.  Past Psychiatric History: Patient has a history of schizophrenia and has had prior hospitalizations. He has had outpatient treatment in the community. He has resisted living in group homes and has mostly stayed with  his mother. Patient was supposed to be on Invega long-acting after he left his last inpatient stay. He says he didn't like that medicine and so he had been taking Saphris. He says that the Saphris gave him a rash on his face and so he stopped taking it. Strangely that is still the only medicine that he wants to take right now. Denies having tried to kill himself. Has been agitated in the past but I don't think he's been severely violent.  Risk to Self: Suicidal Ideation: No Suicidal Intent: No Is patient at risk for suicide?: No Suicidal Plan?: No Access to Means: No What has been your use of drugs/alcohol within the last 12 months?: denied use How many times?: 0 Other Self Harm Risks: denied Triggers for Past Attempts: None known Intentional Self Injurious Behavior: None Risk to Others: Homicidal Ideation: No Thoughts of Harm to Others: No Current Homicidal Intent: No Current Homicidal Plan: No Access to Homicidal Means: No Identified Victim: None identified History of harm to others?: No Assessment of Violence: None Noted Violent Behavior Description: n/a Does patient have access to weapons?: Yes (Comment) Criminal Charges Pending?: No Does patient have a court date: No Prior Inpatient Therapy: Prior Inpatient Therapy: Yes Prior Therapy Dates: 2017 and prior Prior Therapy Facilty/Provider(s): [facility], [facility]  Reason for Treatment: Schizhophrenia  Prior Outpatient Therapy: Prior Outpatient Therapy: Yes Prior Therapy Dates: 2017 Prior Therapy Facilty/Provider(s): [facility] Reason for Treatment: Schizophrenia Does patient have an ACCT team?: No Does patient have Intensive In-House Services?  : No Does patient have [facility] services? : No Does patient have P4CC services?: No  Past Medical History:  Past Medical History:  Diagnosis Date  . 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  [name] affective schizophrenia [facility])     Past  Surgical History:  Procedure Laterality Date  . NO PAST SURGERIES     Family History: No family history on file. Family Psychiatric  History: Unknown by the patient. Social History:  History  Alcohol Use No    Comment: 4 to 5 beers a day every other week for sleep     History  Drug Use No    Social History   Social History  . Marital status: Single    Spouse name: N/A  . Number of children: N/A  . Years of education: N/A   Social History Main Topics  . Smoking status: Current Every Day Smoker    Packs/day: 1.00    Years: 10.00    Types: Cigarettes  . Smokeless tobacco: Never Used  . Alcohol use No     Comment: 4 to 5 beers a day every other week for sleep  . Drug use: No  . Sexual activity: No   Other Topics Concern  . None   Social History Narrative  . None   Additional Social History:    Allergies:   Allergies  Allergen Reactions  . Haldol [Haloperidol Lactate] Other (See Comments)    Uncontrolled muscle movement  . Maalox [Calcium Carbonate Antacid] Other (See Comments)    Thinks this medication makes his face droop, but it could be another medication.  . Tylenol With Codeine #3 [Acetaminophen-Codeine] Palpitations    Labs:  Results for orders placed or performed during the hospital encounter of 03/31/16 (from the past 48 hour(s))  Comprehensive metabolic panel     Status: Abnormal   Collection Time: 03/31/16  2:02 AM  Result Value Ref Range   Sodium 139 135 - 145 mmol/L   Potassium 3.9 3.5 - 5.1 mmol/L   Chloride 104 101 - 111 mmol/L   CO2 27 22 - 32 mmol/L   Glucose, Bld 121 (H) 65 - 99 mg/dL   BUN 13 6 - 20 mg/dL   Creatinine, Ser 1.14 0.61 - 1.24 mg/dL   Calcium 9.4 8.9 - 65.4 mg/dL   Total Protein 7.7 6.5 - 8.1 g/dL   Albumin 4.4 3.5 - 5.0 g/dL   AST 23 15 - 41 U/L   ALT 37 17 - 63 U/L   Alkaline Phosphatase 76 38 - 126 U/L   Total Bilirubin 0.3 0.3 - 1.2 mg/dL   GFR calc non Af Amer >60 >60 mL/min   GFR calc Af Amer >60 >60 mL/min     Comment: (NOTE) The eGFR has been calculated using the CKD EPI equation. This calculation has not been validated in all clinical situations. eGFR's persistently <60 mL/min signify possible Chronic Kidney Disease.    Anion gap 8 5 - 15  Ethanol     Status: None   Collection Time: 03/31/16  2:02 AM  Result Value Ref Range   Alcohol, Ethyl (B) <5 <5 mg/dL    Comment:        LOWEST DETECTABLE LIMIT FOR SERUM ALCOHOL IS 5 mg/dL FOR MEDICAL PURPOSES ONLY   Salicylate level     Status: None   Collection Time: 03/31/16  2:02 AM  Result Value Ref Range   Salicylate Lvl <7.0 2.8 - 30.0 mg/dL  Acetaminophen level     Status: Abnormal   Collection Time:  03/31/16  2:02 AM  Result Value Ref Range   Acetaminophen (Tylenol), Serum <10 (L) 10 - 30 ug/mL    Comment:        THERAPEUTIC CONCENTRATIONS VARY SIGNIFICANTLY. A RANGE OF 10-30 ug/mL MAY BE AN EFFECTIVE CONCENTRATION FOR MANY PATIENTS. HOWEVER, SOME ARE BEST TREATED AT CONCENTRATIONS OUTSIDE THIS RANGE. ACETAMINOPHEN CONCENTRATIONS >150 ug/mL AT 4 HOURS AFTER INGESTION AND >50 ug/mL AT 12 HOURS AFTER INGESTION ARE OFTEN ASSOCIATED WITH TOXIC REACTIONS.   cbc     Status: Abnormal   Collection Time: 03/31/16  2:02 AM  Result Value Ref Range   WBC 10.9 (H) 3.8 - 10.6 K/uL   RBC 4.51 4.40 - 5.90 MIL/uL   Hemoglobin 14.7 13.0 - 18.0 g/dL   HCT 41.5 40.0 - 52.0 %   MCV 92.1 80.0 - 100.0 fL   MCH 32.5 26.0 - 34.0 pg   MCHC 35.3 32.0 - 36.0 g/dL   RDW 13.0 11.5 - 14.5 %   Platelets 346 150 - 440 K/uL  Urine Drug Screen, Qualitative     Status: None   Collection Time: 03/31/16  2:02 AM  Result Value Ref Range   Tricyclic, Ur Screen NONE DETECTED NONE DETECTED   Amphetamines, Ur Screen NONE DETECTED NONE DETECTED   MDMA (Ecstasy)Ur Screen NONE DETECTED NONE DETECTED   Cocaine Metabolite,Ur [place] NONE DETECTED NONE DETECTED   Opiate, Ur Screen NONE DETECTED NONE DETECTED   Phencyclidine (PCP) Ur S NONE DETECTED NONE DETECTED    Cannabinoid 50 Ng, Ur [place] NONE DETECTED NONE DETECTED   Barbiturates, Ur Screen NONE DETECTED NONE DETECTED   Benzodiazepine, Ur Scrn NONE DETECTED NONE DETECTED   Methadone Scn, Ur NONE DETECTED NONE DETECTED    Comment: (NOTE) 355  Tricyclics, urine               Cutoff 1000 ng/mL 200  Amphetamines, urine             Cutoff 1000 ng/mL 300  MDMA (Ecstasy), urine           Cutoff 500 ng/mL 400  Cocaine Metabolite, urine       Cutoff 300 ng/mL 500  Opiate, urine                   Cutoff 300 ng/mL 600  Phencyclidine (PCP), urine      Cutoff 25 ng/mL 700  Cannabinoid, urine              Cutoff 50 ng/mL 800  Barbiturates, urine             Cutoff 200 ng/mL 900  Benzodiazepine, urine           Cutoff 200 ng/mL 1000 Methadone, urine                Cutoff 300 ng/mL 1100 1200 The urine drug screen provides only a preliminary, unconfirmed 1300 analytical test result and should not be used for non-medical 1400 purposes. Clinical consideration and professional judgment should 1500 be applied to any positive drug screen result due to possible 1600 interfering substances. A more specific alternate chemical method 1700 must be used in order to obtain a confirmed analytical result.  1800 Gas chromato graphy / mass spectrometry (GC/MS) is the preferred 1900 confirmatory method.     No current facility-administered medications for this encounter.    Current Outpatient Prescriptions  Medication Sig Dispense Refill  . asenapine (SAPHRIS) 5 MG SUBL 24 hr tablet Place 1 tablet (5  mg total) under the tongue 2 (two) times daily. 60 tablet 0  . amantadine (SYMMETREL) 100 MG capsule Take 2 capsules (200 mg total) by mouth at bedtime. (Patient not taking: Reported on 11/01/2015) 60 capsule 0  . cephALEXin (KEFLEX) 500 MG capsule Take 1 capsule (500 mg total) by mouth 3 (three) times daily. (Patient not taking: Reported on 03/31/2016) 20 capsule 0  . hydrOXYzine (VISTARIL) 50 MG capsule Take 1 capsule (50 mg  total) by mouth at bedtime as needed (insomnia). (Patient not taking: Reported on 03/31/2016) 30 capsule 0  . naproxen (NAPROSYN) 500 MG tablet Take 1 tablet (500 mg total) by mouth 2 (two) times daily. (Patient not taking: Reported on 03/31/2016) 20 tablet 0  . paliperidone (INVEGA SUSTENNA) 234 MG/1.5ML SUSP injection Inject 234 mg into the muscle once. (Patient not taking: Reported on 11/01/2015) 0.9 mL 0  . paliperidone (INVEGA) 6 MG 24 hr tablet Take 2 tablets (12 mg total) by mouth at bedtime. (Patient not taking: Reported on 11/01/2015) 60 tablet 0  . penicillin v potassium (VEETID) 500 MG tablet Take 1 tablet (500 mg total) by mouth 3 (three) times daily. (Patient not taking: Reported on 03/31/2016) 30 tablet 0    Musculoskeletal: Strength & Muscle Tone: within normal limits Gait & Station: normal Patient leans: N/A  Psychiatric Specialty Exam: Physical Exam  Nursing note and vitals reviewed. Constitutional: He appears well-developed and well-nourished.  HENT:  Head: Normocephalic and atraumatic.  Eyes: Conjunctivae are normal. Pupils are equal, round, and reactive to light.  Neck: Normal range of motion.  Cardiovascular: Regular rhythm and normal heart sounds.   Respiratory: Effort normal and breath sounds normal. No respiratory distress.  GI: Soft.  Musculoskeletal: Normal range of motion.  Neurological: He is alert.  Skin: Skin is warm and dry.  Psychiatric: His affect is blunt. His speech is delayed. He is slowed and actively hallucinating. Thought content is paranoid and delusional. He expresses impulsivity. He expresses no homicidal and no suicidal ideation. He exhibits abnormal recent memory.    Review of Systems  Constitutional: Negative.   HENT: Negative.   Eyes: Negative.   Respiratory: Negative.   Cardiovascular: Negative.   Gastrointestinal: Negative.   Musculoskeletal: Negative.   Skin: Negative.   Neurological: Negative.   Psychiatric/Behavioral: Positive for  depression, hallucinations and memory loss. Negative for substance abuse and suicidal ideas. The patient is nervous/anxious and has insomnia.     Blood pressure 122/81, pulse 70, temperature 98.4 F (36.9 C), temperature source Oral, resp. rate 18, height '5\' 11"'$  (1.803 m), weight 127 kg (280 lb), SpO2 97 %.Body mass index is 39.05 kg/m.  General Appearance: Disheveled  Eye Contact:  Minimal  Speech:  Slow  Volume:  Decreased  Mood:  Anxious, Depressed and Dysphoric  Affect:  Blunt  Thought Process:  Linear  Orientation:  Full (Time, Place, and Person)  Thought Content:  Hallucinations: Auditory Command:  To hurt himself  Suicidal Thoughts:  Yes.  without intent/plan  Homicidal Thoughts:  No  Memory:  Immediate;   Good Recent;   Poor Remote;   Fair  Judgement:  Intact  Insight:  Shallow  Psychomotor Activity:  Decreased  Concentration:  Concentration: Poor  Recall:  [employer] of Knowledge:  Fair  Language:  Fair  Akathisia:  No  Handed:  Right  AIMS (if indicated):     Assets:  Housing Physical Health Social Support  ADL's:  Impaired  Cognition:  Impaired,  Mild  Sleep:  Treatment Plan Summary: Daily contact with patient to assess and evaluate symptoms and progress in treatment, Medication management and Plan     Disposition: Daily contact with patient to assess and evaluate symptoms and progress in treatment, Medication management and Plan 31 year old man with schizophrenia. Returns to the hospital once again complaining of his hallucinations which are clearly disturbing to him. He is calm but withdrawn and appears quite unhappy. Patient will be admitted to the psychiatric ward and is agreeable to the plan. Treatment team will have to negotiate with him I believe about what appropriate medication will be. Patient is requesting to continue taking Saphris. Not clear if he really had an allergy to it. I have tried to reach his mother by telephone but no one is answering.  We will recheck full set of labs. 15 minute checks in place.  Alethia Berthold, MD 03/31/2016 12:20 PM

## 2016-03-31 NOTE — ED Notes (Signed)
Patient came to this writer asking to be discharged. Patient states, "[facility] and I will speak with the doctor there and get my medications straight."

## 2016-03-31 NOTE — ED Notes (Addendum)
Brought patient breakfast and toiletries for shower.

## 2016-03-31 NOTE — ED Notes (Signed)
Pt  Vol  Pending  Placement  To  beh  med

## 2016-03-31 NOTE — ED Notes (Signed)
Patient presents with a blunted affect and withdrawn behavior. Pt does report that his auditory hallucinations have lessened and  that he feels "mellow" compared to how he has been feeling. Pt currently denies SI/HI A. Labs and vitals monitored. Pt supported emotionally and encouraged to express concerns and ask questions.  Pt remains safe with 15 minute checks.

## 2016-03-31 NOTE — Progress Notes (Signed)
[facility] being [facility]. [facility] for the charge nurse to review and provide bed assignment.      Cheryl FlashNicole Ahmad Vanwey, MS, NCC, [facility] Therapeutic Triage Specialist

## 2016-03-31 NOTE — ED Notes (Signed)
Mother here to visit patient.

## 2016-03-31 NOTE — BHH Counselor (Signed)
Patient accepted to [facility] by Dr. Toni Amendlapacs. Bed assignment:  305A Attending MD:  Dr. Jennet MaduroPucilowska

## 2016-03-31 NOTE — ED Notes (Signed)
Lunch brought to patient 

## 2016-03-31 NOTE — ED Notes (Signed)
Patient in shower 
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
## 2016-03-31 NOTE — ED Notes (Signed)
Meal brought to patient 

## 2016-03-31 NOTE — ED Provider Notes (Signed)
-----------------------------------------   8:51 PM on 03/31/2016 -----------------------------------------   Blood pressure 125/75, pulse 92, temperature 97.7 F (36.5 C), temperature source Oral, resp. rate 18, height 5\' 11"  (1.803 m), weight 280 lb (127 kg), SpO2 99 %.  The patient had no acute events since last update.  Calm and cooperative at this time.    Patient wishing to go home at this time. Patient denies any suicidal or homicidal ideation. Patient not under involuntary commitment.  He says that [facility]. He'll be discharged to home. He has not clinically intoxicated. He has good insight into his illness.    Myrna Blazeravid Matthew Schaevitz, MD 03/31/16 [phone]

## 2016-04-04 ENCOUNTER — Encounter (HOSPITAL_COMMUNITY): Payer: Self-pay | Admitting: Nurse Practitioner

## 2016-04-04 ENCOUNTER — Emergency Department (HOSPITAL_COMMUNITY)
Admission: EM | Admit: 2016-04-04 | Discharge: 2016-04-06 | Disposition: A | Discharge status: Other Acute Inpatient Hospital | Payer: Self-pay | Attending: Emergency Medicine | Admitting: Emergency Medicine

## 2016-04-04 DIAGNOSIS — F203 Undifferentiated schizophrenia: Secondary | ICD-10-CM | POA: Insufficient documentation

## 2016-04-04 DIAGNOSIS — R451 Restlessness and agitation: Secondary | ICD-10-CM | POA: Insufficient documentation

## 2016-04-04 DIAGNOSIS — Z79899 Other long term (current) drug therapy: Secondary | ICD-10-CM | POA: Insufficient documentation

## 2016-04-04 DIAGNOSIS — F17210 Nicotine dependence, cigarettes, uncomplicated: Secondary | ICD-10-CM | POA: Insufficient documentation

## 2016-04-04 LAB — CBC
HCT: 43.1 % (ref 39.0–52.0)
HEMOGLOBIN: 14.9 g/dL (ref 13.0–17.0)
MCH: 32.1 pg (ref 26.0–34.0)
MCHC: 34.6 g/dL (ref 30.0–36.0)
MCV: 92.9 fL (ref 78.0–100.0)
Platelets: 349 10*3/uL (ref 150–400)
RBC: 4.64 MIL/uL (ref 4.22–5.81)
RDW: 13 % (ref 11.5–15.5)
WBC: 10.2 10*3/uL (ref 4.0–10.5)

## 2016-04-04 LAB — COMPREHENSIVE METABOLIC PANEL
ALT: 45 U/L (ref 17–63)
ANION GAP: 10 (ref 5–15)
AST: 27 U/L (ref 15–41)
Albumin: 4.4 g/dL (ref 3.5–5.0)
Alkaline Phosphatase: 81 U/L (ref 38–126)
BUN: 10 mg/dL (ref 6–20)
CHLORIDE: 100 mmol/L — AB (ref 101–111)
CO2: 26 mmol/L (ref 22–32)
Calcium: 9.3 mg/dL (ref 8.9–10.3)
Creatinine, Ser: 0.85 mg/dL (ref 0.61–1.24)
GFR calc non Af Amer: >60 mL/min (ref >60)
Glucose, Bld: 96 mg/dL (ref 65–99)
Potassium: 4.2 mmol/L (ref 3.5–5.1)
SODIUM: 136 mmol/L (ref 135–145)
Total Bilirubin: 0.3 mg/dL (ref 0.3–1.2)
Total Protein: 7.6 g/dL (ref 6.5–8.1)

## 2016-04-04 LAB — SALICYLATE LEVEL

## 2016-04-04 LAB — RAPID URINE DRUG SCREEN, HOSP PERFORMED
AMPHETAMINES: NOT DETECTED
Barbiturates: NOT DETECTED
Benzodiazepines: NOT DETECTED
COCAINE: NOT DETECTED
OPIATES: NOT DETECTED
TETRAHYDROCANNABINOL: NOT DETECTED

## 2016-04-04 LAB — ACETAMINOPHEN LEVEL

## 2016-04-04 LAB — ETHANOL: Alcohol, Ethyl (B): <5 mg/dL (ref <5)

## 2016-04-04 MED ORDER — DIPHENHYDRAMINE HCL 50 MG/ML IJ SOLN: 50.0000 mg | INTRAMUSCULAR | Status: DC | PRN

## 2016-04-04 MED ORDER — IBUPROFEN 800 MG PO TABS
800.0000 mg | ORAL_TABLET | Freq: Once | ORAL | Status: AC
  Administered 2016-04-04: 800 mg via ORAL
  Filled 2016-04-04: qty 1

## 2016-04-04 MED ORDER — LORAZEPAM 2 MG/ML IJ SOLN: 2.0000 mg | Freq: Once | INTRAMUSCULAR | Status: DC

## 2016-04-04 MED ORDER — LORAZEPAM 2 MG/ML IJ SOLN: 2.0000 mg | INTRAMUSCULAR | Status: DC | PRN

## 2016-04-04 MED ORDER — LORAZEPAM 1 MG PO TABS
1.0000 mg | ORAL_TABLET | Freq: Once | ORAL | Status: AC
  Administered 2016-04-04: 1 mg via ORAL
  Filled 2016-04-04: qty 1

## 2016-04-04 MED ORDER — ZIPRASIDONE MESYLATE 20 MG IM SOLR: 20.0000 mg | Freq: Once | INTRAMUSCULAR | Status: DC

## 2016-04-04 MED ORDER — ZIPRASIDONE MESYLATE 20 MG IM SOLR: 20.0000 mg | INTRAMUSCULAR | Status: DC | PRN

## 2016-04-04 MED ORDER — DIPHENHYDRAMINE HCL 50 MG/ML IJ SOLN: 50.0000 mg | Freq: Once | INTRAMUSCULAR | Status: DC
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
## 2016-04-04 NOTE — ED Provider Notes (Signed)
[facility]-EMERGENCY DEPT Provider Note   CSN: [id number] Arrival date & time: 04/04/16  0012    History   Chief Complaint Chief Complaint  Patient presents with  . IVC'd  . Hallucinations    HPI Shane Allen is a 31 y.o. male.  31 year old male with a history of schizoaffective schizophrenia presents to the emergency department under IVC taken out by family. Patient states that he became "annoyed" when he was around the police today. This prompted his involuntary commitment. He denies any suicidal or homicidal thoughts. He states that he has been compliant with his psychiatric medications. He reports auditory hallucinations which have improved on his medication, but not completely gone away. He denies any alcohol or illicit drug use.   The history is provided by the patient. No language interpreter was used.    Past Medical History:  Diagnosis Date  . 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  [name] affective schizophrenia [facility])     Patient Active Problem List   Diagnosis Date Noted  . Noncompliance 03/27/2016  . Schizophrenia (HCC) 08/17/2015  . Tobacco use disorder 10/27/2014    Past Surgical History:  Procedure Laterality Date  . NO PAST SURGERIES        Home Medications    Prior to Admission medications   Medication Sig Start Date End Date Taking? Authorizing Provider  asenapine (SAPHRIS) 5 MG SUBL 24 hr tablet Place 1 tablet (5 mg total) under the tongue 2 (two) times daily. 03/27/16  Yes Audery Amel, MD  amantadine (SYMMETREL) 100 MG capsule Take 2 capsules (200 mg total) by mouth at bedtime. Patient not taking: Reported on 11/01/2015 08/22/15   Jimmy Footman, MD  cephALEXin (KEFLEX) 500 MG capsule Take 1 capsule (500 mg total) by mouth 3 (three) times daily. Patient not taking: Reported on 03/31/2016 03/10/16   Janne Napoleon, NP  hydrOXYzine (VISTARIL) 50 MG capsule Take 1 capsule (50 mg total) by  mouth at bedtime as needed (insomnia). Patient not taking: Reported on 03/31/2016 08/21/15   Jimmy Footman, MD  naproxen (NAPROSYN) 500 MG tablet Take 1 tablet (500 mg total) by mouth 2 (two) times daily. Patient not taking: Reported on 03/31/2016 02/14/16   Felicie Morn, NP  paliperidone (INVEGA SUSTENNA) 234 MG/1.5ML SUSP injection Inject 234 mg into the muscle once. Patient not taking: Reported on 11/01/2015 09/18/15   Jimmy Footman, MD  paliperidone (INVEGA) 6 MG 24 hr tablet Take 2 tablets (12 mg total) by mouth at bedtime. Patient not taking: Reported on 11/01/2015 08/22/15   Jimmy Footman, MD  penicillin v potassium (VEETID) 500 MG tablet Take 1 tablet (500 mg total) by mouth 3 (three) times daily. Patient not taking: Reported on 03/31/2016 02/14/16   Felicie Morn, NP    Family History History reviewed. No pertinent family history.  Social History Social History  Substance Use Topics  . Smoking status: Current Every Day Smoker    Packs/day: 1.00    Years: 10.00    Types: Cigarettes  . Smokeless tobacco: Never Used  . Alcohol use No     Comment: 4 to 5 beers a day every other week for sleep     Allergies   Haldol [haloperidol lactate]; Maalox [calcium carbonate antacid]; and Tylenol with codeine #3 [acetaminophen-codeine]   Review of Systems Review of Systems Ten systems reviewed and are negative for acute change, except as noted in the HPI.  Physical Exam Updated Vital Signs BP 128/71 (BP Location: Left Arm)   Pulse 71   Temp 99.1 F (37.3 C) (Oral)   Resp 17   SpO2 100%   Physical Exam  Constitutional: He is oriented to person, place, and time. He appears well-developed and well-nourished. No distress.  HENT:  Head: Normocephalic and atraumatic.  Eyes: Conjunctivae and EOM are normal. No scleral icterus.  Neck: Normal range of motion.  Pulmonary/Chest: Effort normal. No respiratory distress.  Musculoskeletal: Normal range of motion.    Neurological: He is alert and oriented to person, place, and time.  Skin: Skin is warm and dry. No rash noted. He is not diaphoretic. No erythema. No pallor.  Psychiatric: He has a normal mood and affect. His behavior is normal. He expresses no homicidal and no suicidal ideation.  Nursing note and vitals reviewed.    ED Treatments / Results  Labs (all labs ordered are listed, but only abnormal results are displayed) Labs Reviewed  COMPREHENSIVE METABOLIC PANEL - Abnormal; Notable for the following:       Result Value   Chloride 100 (*)    All other components within normal limits  ACETAMINOPHEN LEVEL - Abnormal; Notable for the following:    Acetaminophen (Tylenol), Serum <10 (*)    All other components within normal limits  ETHANOL  SALICYLATE LEVEL  CBC  RAPID URINE DRUG SCREEN, HOSP PERFORMED    EKG  EKG Interpretation None       Radiology No results found.  Procedures Procedures (including critical care time)  Medications Ordered in ED Medications  ibuprofen (ADVIL,MOTRIN) tablet 800 mg (800 mg Oral Given 04/04/16 0052)     Initial Impression / Assessment and Plan / ED Course  I have reviewed the triage vital signs and the nursing notes.  Pertinent labs & imaging results that were available during my care of the patient were reviewed by me and considered in my medical decision making (see chart for details).     31 year old male presents to the emergency department for evaluation by psychiatry. He has been involuntarily committed. He is medically cleared. Disposition to be determined by oncoming ED provider.   Final Clinical Impressions(s) / ED Diagnoses   Final diagnoses:  Agitation    New Prescriptions New Prescriptions   No medications on file     Antony MaduraKelly Samir Ishaq, PA-C 04/04/16 [id number]    April Palumbo, MD 04/04/16 [phone]

## 2016-04-04 NOTE — ED Notes (Signed)
Request to NP for PRN anxiety medications.

## 2016-04-04 NOTE — ED Notes (Signed)
SBAR Report received from previous nurse. Pt received calm and visible on unit. Pt denies current SI/ HI, A/V H, depression, anxiety, or pain at this time, and appears otherwise stable and free of distress. Pt reminded of camera surveillance, q 15 min rounds, and rules of the milieu. Will continue to assess. 

## 2016-04-04 NOTE — ED Notes (Signed)
[facility] Ex[facility] date:  Expected time:  Means of arrival:  Comments: St[place]

## 2016-04-04 NOTE — Progress Notes (Signed)
CSW filed patient's examination paperwork into IVC logbook.  

## 2016-04-04 NOTE — ED Notes (Signed)
SBAR Report received from previous nurse. Pt received calm and resting on unit. Pt denies current SI/ HI, A/V H, depression, anxiety, or pain at this time, and appears otherwise stable and free of distress. Pt reminded of camera surveillance, q 15 min rounds, and rules of the milieu. Will continue to assess. 

## 2016-04-04 NOTE — ED Notes (Signed)
Update to pts mother of POC. Support and encouragement to pt.  Explained process of waiting for inpatient bed.

## 2016-04-04 NOTE — ED Triage Notes (Signed)
Pt has been presented by [employer]sher[employer] deputy with IVC paper with petitioner stating that patient has been having outburst, hearing voices and imminent danger to himself. Pt endorses all these, chart review [facility]. He states he is "still having a lot going on." He denies suicidal or homicidal ideation.

## 2016-04-05 DIAGNOSIS — Z9119 Patient's noncompliance with other medical treatment and regimen: Secondary | ICD-10-CM

## 2016-04-05 DIAGNOSIS — F203 Undifferentiated schizophrenia: Secondary | ICD-10-CM

## 2016-04-05 DIAGNOSIS — Z79899 Other long term (current) drug therapy: Secondary | ICD-10-CM

## 2016-04-05 DIAGNOSIS — F17210 Nicotine dependence, cigarettes, uncomplicated: Secondary | ICD-10-CM

## 2016-04-05 DIAGNOSIS — Z888 Allergy status to other drugs, medicaments and biological substances status: Secondary | ICD-10-CM

## 2016-04-05 MED ORDER — LURASIDONE HCL 40 MG PO TABS
40.0000 mg | ORAL_TABLET | Freq: Every day | ORAL | Status: DC
  Administered 2016-04-05: 40 mg via ORAL
  Filled 2016-04-05 (×2): qty 1

## 2016-04-05 MED ORDER — LORAZEPAM 1 MG PO TABS
2.0000 mg | ORAL_TABLET | Freq: Once | ORAL | Status: AC
  Administered 2016-04-05: 2 mg via ORAL
  Filled 2016-04-05: qty 2

## 2016-04-05 MED ORDER — TRAZODONE HCL 50 MG PO TABS
50.0000 mg | ORAL_TABLET | Freq: Every evening | ORAL | Status: DC | PRN
Start: 2016-04-05 — End: 2016-04-06
  Administered 2016-04-05: 50 mg via ORAL
  Filled 2016-04-05: qty 1

## 2016-04-05 NOTE — ED Notes (Signed)
Pt up to desk asking for medication for sleep. Pt stated he takes trazodone for sleep at home but that it doesn't really work.

## 2016-04-05 NOTE — ED Notes (Signed)
Pt came to desk telling staff that the voices are telling him to shove something up his but. Writer questioned pt about this privately in pt room explaining that the oral medication just provided orally is the same medication the doctors would have ordered for intrusive voices. Writer asked pt if he felt safe and in control enough not to act on what the voices are telling him, he aggreed and contracted to come to Clinical research associatewriter if the command voices got overwhelming and urge for self harm became too great.

## 2016-04-05 NOTE — Consult Note (Signed)
Shane Allen   Reason for Allen:  Auditory hallucinations Referring Physician:  EDP Patient Identification: Shane Allen MRN:  [id number] Principal Diagnosis: Schizophrenia [facility]) Diagnosis:   Patient Active Problem List   Diagnosis Date Noted  . Schizophrenia ([place]) [F20.9] 08/17/2015    Priority: High  . Noncompliance [Z91.19] 03/27/2016  . Tobacco use disorder [F17.200] 10/27/2014    Total Time spent with patient: 30 minutes  Subjective:   Shane Allen is a 31 y.o. male patient admitted with reports of psychosis, chronic, and hearing voices telling him to do negative things. Pt seen and chart reviewed. Pt is alert/oriented x4, calm, cooperative, and appropriate to situation. Pt denies suicidal/homicidal ideation and does not appear to be responding to internal stimuli. Pt reports that he continues to hear voices but is feeling somewhat better. Pt would like to start back in psych meds but does not want inpatient at this time. Will observe overnight to determine efficacy of Shane medications.   HPI:  I have reviewed and concur with HPI elements below, modified as follows:  "Shane Allen is an 31 y.o. male who presents to Shane ED voluntarily. Pt reports he was recently seen at Shane [facility] due to psychosis and hearing voices. Pt reports he was d/c and told that "he could manage." Pt reports he felt that he could manage, however Shane voices intensified. Pt reports his mother called 911 due to his psychosis and being agitated. Pt reports he has been hearing voices for Shane past 4 years and Shane voices tell him that their is a demon inside of him. Pt reports he has been taking medication however Shane medication does not help with his voices. Pt reports he has been feeling "desparate." Pt reports he feels pain in his leg and thinks that Shane demon is trying to be released through his leg.   Pt denies any SI or HI and reports he has never engaged in self-harming behaviors. Pt  was asked his highest level of education completed and he stated 3rd grade, however per reports Shane pt informed Shane previous counselor at Shane [facility] that his highest level of education was 9th grade. Pt reports he receives therapy and med management at Shane [facility] 1x/ month."  *pt spent Shane night in Shane ED without incident and has been cooperative. Seen today on 04/05/2016 as above.   Past Psychiatric History: schizophrenia, ED visits at [facility] recently  Risk to Self: Suicidal Ideation: No Suicidal Intent: No Is patient at risk for suicide?: No Suicidal Plan?: No Access to Means: No What has been your use of drugs/alcohol within Shane last 12 months?: denies Triggers for Past Attempts: None known Intentional Self Injurious Behavior: None Risk to Others: Homicidal Ideation: No Thoughts of Harm to Others: No Current Homicidal Intent: No Current Homicidal Plan: No Access to Homicidal Means: No History of harm to others?: No Assessment of Violence: None Noted Does patient have access to weapons?: No Criminal Charges Pending?: No Does patient have a court date: No Prior Inpatient Therapy: Prior Inpatient Therapy: Yes Prior Therapy Dates: 2017 and prior Prior Therapy Facilty/Provider(s): Shane [facility]  Reason for Treatment: Schizhophrenia  Prior Outpatient Therapy: Prior Outpatient Therapy: Yes Prior Therapy Dates: 2017 Prior Therapy Facilty/Provider(s): [facility] Reason for Treatment: Schizophrenia Does patient have an ACCT team?: No Does patient have Intensive In-House Services?  : No Does patient have [facility] services? : Yes Does patient have P4CC services?: No  Past Medical History:  Past Medical History:  Diagnosis Date  .  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 NO PAST SURGERIES     Family History: History reviewed. No pertinent family  history. Family Psychiatric  History: deneis Social History:  History  Alcohol Use No    Comment: 4 to 5 beers a day every other week for sleep     History  Drug Use No    Social History   Social History  . Marital status: Single    Spouse name: N/A  . Number of children: N/A  . Years of education: N/A   Social History Main Topics  . Smoking status: Current Every Day Smoker    Packs/day: 1.00    Years: 10.00    Types: Cigarettes  . Smokeless tobacco: Never Used  . Alcohol use No     Comment: 4 to 5 beers a day every other week for sleep  . Drug use: No  . Sexual activity: No   Other Topics Concern  . None   Social History Narrative  . None   Additional Social History:    Allergies:   Allergies  Allergen Reactions  . Haldol [Haloperidol Lactate] Other (See Comments)    Uncontrolled muscle movement  . Maalox [Calcium Carbonate Antacid] Other (See Comments)    Thinks this medication makes his face droop, but it could be another medication.  . Tylenol With Codeine #3 [Acetaminophen-Codeine] Palpitations    Labs:  Results for orders placed or performed during Shane hospital encounter of 04/04/16 (from Shane past 48 hour(s))  Rapid urine drug screen (hospital performed)     Status: None   Collection Time: 04/04/16 12:46 AM  Result Value Ref Range   Opiates NONE DETECTED NONE DETECTED   Cocaine NONE DETECTED NONE DETECTED   Benzodiazepines NONE DETECTED NONE DETECTED   Amphetamines NONE DETECTED NONE DETECTED   Tetrahydrocannabinol NONE DETECTED NONE DETECTED   Barbiturates NONE DETECTED NONE DETECTED    Comment:        DRUG SCREEN FOR MEDICAL PURPOSES ONLY.  IF CONFIRMATION IS NEEDED FOR ANY PURPOSE, NOTIFY LAB WITHIN 5 DAYS.        LOWEST DETECTABLE LIMITS FOR URINE DRUG SCREEN Drug Class       Cutoff (ng/mL) Amphetamine      1000 Barbiturate      200 Benzodiazepine   109 Tricyclics       323 Opiates          300 Cocaine          300 THC               50   Comprehensive metabolic panel     Status: Abnormal   Collection Time: 04/04/16  1:01 AM  Result Value Ref Range   Sodium 136 135 - 145 mmol/L   Potassium 4.2 3.5 - 5.1 mmol/L   Chloride 100 (L) 101 - 111 mmol/L   CO2 26 22 - 32 mmol/L   Glucose, Bld 96 65 - 99 mg/dL   BUN 10 6 - 20 mg/dL   Creatinine, Ser 0.85 0.61 - 1.24 mg/dL   Calcium 9.3 8.9 - 10.3 mg/dL   Total Protein 7.6 6.5 - 8.1 g/dL   Albumin 4.4 3.5 - 5.0 g/dL   AST 27 15 - 41 U/L   ALT 45 17 - 63 U/L   Alkaline  Phosphatase 81 38 - 126 U/L   Total Bilirubin 0.3 0.3 - 1.2 mg/dL   GFR calc non Af Amer >60 >60 mL/min   GFR calc Af Amer >60 >60 mL/min    Comment: (NOTE) Shane eGFR has been calculated using Shane CKD EPI equation. This calculation has not been validated in all clinical situations. eGFR's persistently <60 mL/min signify possible Chronic Kidney Disease.    Anion gap 10 5 - 15  Ethanol     Status: None   Collection Time: 04/04/16  1:01 AM  Result Value Ref Range   Alcohol, Ethyl (B) <5 <5 mg/dL    Comment:        LOWEST DETECTABLE LIMIT FOR SERUM ALCOHOL IS 5 mg/dL FOR MEDICAL PURPOSES ONLY   Salicylate level     Status: None   Collection Time: 04/04/16  1:01 AM  Result Value Ref Range   Salicylate Lvl <2.4 2.8 - 30.0 mg/dL  Acetaminophen level     Status: Abnormal   Collection Time: 04/04/16  1:01 AM  Result Value Ref Range   Acetaminophen (Tylenol), Serum <10 (L) 10 - 30 ug/mL    Comment:        THERAPEUTIC CONCENTRATIONS VARY SIGNIFICANTLY. A RANGE OF 10-30 ug/mL MAY BE AN EFFECTIVE CONCENTRATION FOR MANY PATIENTS. HOWEVER, SOME ARE BEST TREATED AT CONCENTRATIONS OUTSIDE THIS RANGE. ACETAMINOPHEN CONCENTRATIONS >150 ug/mL AT 4 HOURS AFTER INGESTION AND >50 ug/mL AT 12 HOURS AFTER INGESTION ARE OFTEN ASSOCIATED WITH TOXIC REACTIONS.   cbc     Status: None   Collection Time: 04/04/16  1:01 AM  Result Value Ref Range   WBC 10.2 4.0 - 10.5 K/uL   RBC 4.64 4.22 - 5.81 MIL/uL    Hemoglobin 14.9 13.0 - 17.0 g/dL   HCT 43.1 39.0 - 52.0 %   MCV 92.9 78.0 - 100.0 fL   MCH 32.1 26.0 - 34.0 pg   MCHC 34.6 30.0 - 36.0 g/dL   RDW 13.0 11.5 - 15.5 %   Platelets 349 150 - 400 K/uL    Current Facility-Administered Medications  Medication Dose Route Frequency Provider Last Rate Last Dose  . diphenhydrAMINE (BENADRYL) injection 50 mg  50 mg Intramuscular PRN Shuvon B Rankin, NP      . LORazepam (ATIVAN) injection 2 mg  2 mg Intramuscular PRN Shuvon B Rankin, NP      . ziprasidone (GEODON) injection 20 mg  20 mg Intramuscular PRN Shuvon B Rankin, NP       Current Outpatient Prescriptions  Medication Sig Dispense Refill  . asenapine (SAPHRIS) 5 MG SUBL 24 hr tablet Place 1 tablet (5 mg total) under Shane tongue 2 (two) times daily. 60 tablet 0  . amantadine (SYMMETREL) 100 MG capsule Take 2 capsules (200 mg total) by mouth at bedtime. (Patient not taking: Reported on 11/01/2015) 60 capsule 0  . cephALEXin (KEFLEX) 500 MG capsule Take 1 capsule (500 mg total) by mouth 3 (three) times daily. (Patient not taking: Reported on 03/31/2016) 20 capsule 0  . hydrOXYzine (VISTARIL) 50 MG capsule Take 1 capsule (50 mg total) by mouth at bedtime as needed (insomnia). (Patient not taking: Reported on 03/31/2016) 30 capsule 0  . naproxen (NAPROSYN) 500 MG tablet Take 1 tablet (500 mg total) by mouth 2 (two) times daily. (Patient not taking: Reported on 03/31/2016) 20 tablet 0  . paliperidone (INVEGA SUSTENNA) 234 MG/1.5ML SUSP injection Inject 234 mg into Shane muscle once. (Patient not taking: Reported on 11/01/2015) 0.9 mL 0  .  paliperidone (INVEGA) 6 MG 24 hr tablet Take 2 tablets (12 mg total) by mouth at bedtime. (Patient not taking: Reported on 11/01/2015) 60 tablet 0  . penicillin v potassium (VEETID) 500 MG tablet Take 1 tablet (500 mg total) by mouth 3 (three) times daily. (Patient not taking: Reported on 03/31/2016) 30 tablet 0    Musculoskeletal: Strength & Muscle Tone: within normal limits Gait &  Station: normal Patient leans: N/A  Psychiatric Specialty Exam: Physical Exam  Review of Systems  Psychiatric/Behavioral: Positive for depression and hallucinations. Negative for suicidal ideas. Shane patient is nervous/anxious and has insomnia.   All other systems reviewed and are negative.   Blood pressure 112/79, pulse 74, temperature 98.2 F (36.8 C), temperature source Oral, resp. rate 16, SpO2 96 %.There is no height or weight on file to calculate BMI.  General Appearance: Casual and Fairly Groomed  Eye Contact:  Good  Speech:  Clear and Coherent and Normal Rate  Volume:  Normal  Mood:  Anxious  Affect:  Appropriate and Congruent  Thought Process:  Coherent, Goal Directed, Linear and Descriptions of Associations: Intact  Orientation:  Full (Time, Place, and Person)  Thought Content:  Focused on auditory hallucinations  Suicidal Thoughts:  No  Homicidal Thoughts:  No  Memory:  Immediate;   Fair Recent;   Fair Remote;   Fair  Judgement:  Fair  Insight:  Fair  Psychomotor Activity:  Normal  Concentration:  Concentration: Fair and Attention Span: Fair  Recall:  [employer] of Knowledge:  Fair  Language:  Fair  Akathisia:  No  Handed:    AIMS (if indicated):     Assets:  Communication Skills Desire for Improvement Resilience Social Support  ADL's:  Intact  Cognition:  WNL  Sleep:      Treatment Plan Summary: Schizophrenia ([place]) will observe overnight as we start psychotropics to observe for safety for discharge vs inpatient:  Medications: Latuda 12m po daily at dinnertime  -12 lead EKG   Disposition: See plan above  WBenjamine Mola FNP 04/05/2016 1:02 PM

## 2016-04-05 NOTE — ED Notes (Signed)
2300 Pt came to desk telling writer that the voices have come back and they are keeping him awake. Will call for orders.

## 2016-04-05 NOTE — ED Notes (Signed)
Patient seclusive to room.  States "I am feeling so much better." No aggressive behavior.  Report to MD.  15' checks cont for safety.

## 2016-04-06 MED ORDER — TRAZODONE HCL 50 MG PO TABS: 50.0000 mg | ORAL_TABLET | Freq: Every evening | ORAL | 0 refills | Status: DC | PRN

## 2016-04-06 MED ORDER — LURASIDONE HCL 40 MG PO TABS: 40.0000 mg | ORAL_TABLET | Freq: Every day | ORAL | 0 refills | Status: DC

## 2016-04-06 NOTE — Consult Note (Signed)
[facility]   Reason for Consult:  Psychosis  Referring Physician:  EDP Patient Identification: Shane Allen MRN:  [id number] Principal Diagnosis: Schizophrenia, undifferentiated (HCC) Diagnosis:   Patient Active Problem List   Diagnosis Date Noted  . Schizophrenia, undifferentiated (HCC) [F20.3] 08/17/2015    Priority: High  . Noncompliance [Z91.19] 03/27/2016  . Tobacco use disorder [F17.200] 10/27/2014    Total Time spent with patient: 30 minutes  Subjective:   Shane Allen is a 31 y.o. male patient has stabilized.  HPI:  31 yo male who presented to the ED with auditory hallucinations.  Medications were started and he stabilized.  Denies hallucinations along with suicidal/homicidal ideations and substance abuse.  He will follow-up with [facility], stable for discharge--will return to live with his mother.  Past Psychiatric History: schizophrenia  Risk to Self: Suicidal Ideation: No Suicidal Intent: No Is patient at risk for suicide?: No Suicidal Plan?: No Access to Means: No What has been your use of drugs/alcohol within the last 12 months?: denies Triggers for Past Attempts: None known Intentional Self Injurious Behavior: None Risk to Others: Homicidal Ideation: No Thoughts of Harm to Others: No Current Homicidal Intent: No Current Homicidal Plan: No Access to Homicidal Means: No History of harm to others?: No Assessment of Violence: None Noted Does patient have access to weapons?: No Criminal Charges Pending?: No Does patient have a court date: No Prior Inpatient Therapy: Prior Inpatient Therapy: Yes Prior Therapy Dates: 2017 and prior [facility]  Reason for Treatment: Schizhophrenia  Prior Outpatient Therapy: Prior Outpatient Therapy: Yes Prior Therapy Dates: 2017 Prior Therapy Facilty/Provider(s): [facility] Reason for Treatment: Schizophrenia Does patient have an ACCT team?: No Does patient have  Intensive In-House Services?  : No Does patient have [facility] services? : Yes Does patient have P4CC services?: No  Past Medical History:  Past Medical History:  Diagnosis Date  . 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  [name]. Schizo affective schizophrenia [facility](HCC)     Past Surgical History:  Procedure Laterality Date  . NO PAST SURGERIES     Family History: History reviewed. No pertinent family history. Family Psychiatric  History: none Social History:  History  Alcohol Use No    Comment: 4 to 5 beers a day every other week for sleep     History  Drug Use No    Social History   Social History  . Marital status: Single    Spouse name: N/A  . Number of children: N/A  . Years of education: N/A   Social History Main Topics  . Smoking status: Current Every Day Smoker    Packs/day: 1.00    Years: 10.00    Types: Cigarettes  . Smokeless tobacco: Never Used  . Alcohol use No     Comment: 4 to 5 beers a day every other week for sleep  . Drug use: No  . Sexual activity: No   Other Topics Concern  . None   Social History Narrative  . None   Additional Social History:    Allergies:   Allergies  Allergen Reactions  . Haldol [Haloperidol Lactate] Other (See Comments)    Uncontrolled muscle movement  . Maalox [Calcium Carbonate Antacid] Other (See Comments)    Thinks this medication makes his face droop, but it could be another medication.  . Tylenol With Codeine #3 [Acetaminophen-Codeine] Palpitations    Labs: No results found  for this or any previous visit (from the past 48 hour(s)).  Current Facility-Administered Medications  Medication Dose Route Frequency Provider Last Rate Last Dose  . diphenhydrAMINE (BENADRYL) injection 50 mg  50 mg Intramuscular PRN Shuvon B Rankin, NP      . lurasidone (LATUDA) tablet 40 mg  40 mg Oral QAC supper Beau Fanny, FNP   40 mg at 04/05/16 1732  . traZODone (DESYREL) tablet 50  mg  50 mg Oral QHS PRN Jackelyn Poling, NP   50 mg at 04/05/16 2235   Current Outpatient Prescriptions  Medication Sig Dispense Refill  . asenapine (SAPHRIS) 5 MG SUBL 24 hr tablet Place 1 tablet (5 mg total) under the tongue 2 (two) times daily. 60 tablet 0  . amantadine (SYMMETREL) 100 MG capsule Take 2 capsules (200 mg total) by mouth at bedtime. (Patient not taking: Reported on 11/01/2015) 60 capsule 0  . cephALEXin (KEFLEX) 500 MG capsule Take 1 capsule (500 mg total) by mouth 3 (three) times daily. (Patient not taking: Reported on 03/31/2016) 20 capsule 0  . hydrOXYzine (VISTARIL) 50 MG capsule Take 1 capsule (50 mg total) by mouth at bedtime as needed (insomnia). (Patient not taking: Reported on 03/31/2016) 30 capsule 0  . naproxen (NAPROSYN) 500 MG tablet Take 1 tablet (500 mg total) by mouth 2 (two) times daily. (Patient not taking: Reported on 03/31/2016) 20 tablet 0  . paliperidone (INVEGA SUSTENNA) 234 MG/1.5ML SUSP injection Inject 234 mg into the muscle once. (Patient not taking: Reported on 11/01/2015) 0.9 mL 0  . paliperidone (INVEGA) 6 MG 24 hr tablet Take 2 tablets (12 mg total) by mouth at bedtime. (Patient not taking: Reported on 11/01/2015) 60 tablet 0  . penicillin v potassium (VEETID) 500 MG tablet Take 1 tablet (500 mg total) by mouth 3 (three) times daily. (Patient not taking: Reported on 03/31/2016) 30 tablet 0    Musculoskeletal: Strength & Muscle Tone: within normal limits Gait & Station: normal Patient leans: N/A  Psychiatric Specialty Exam: Physical Exam  Constitutional: He appears well-developed and well-nourished.  HENT:  Head: Normocephalic.  Neck: Normal range of motion.  Respiratory: Effort normal.  Musculoskeletal: Normal range of motion.  Neurological: He is alert.  Psychiatric: He has a normal mood and affect. His speech is normal and behavior is normal. Judgment and thought content normal. Cognition and memory are normal.    Review of Systems  All other systems  reviewed and are negative.   Blood pressure 117/84, pulse 88, temperature 98 F (36.7 C), temperature source Oral, resp. rate 17, SpO2 96 %.There is no height or weight on file to calculate BMI.  General Appearance: Casual  Eye Contact:  Good  Speech:  Normal Rate  Volume:  Normal  Mood:  Euthymic  Affect:  Congruent  Thought Process:  Coherent and Descriptions of Associations: Intact  Orientation:  Full (Time, Place, and Person)  Thought Content:  WDL and Logical  Suicidal Thoughts:  No  Homicidal Thoughts:  No  Memory:  Immediate;   Good Recent;   Good Remote;   Good  Judgement:  Good  Insight:  Good  Psychomotor Activity:  Normal  Concentration:  Concentration: Good and Attention Span: Good  Recall:  Good  Fund of Knowledge:  Good  Language:  Good  Akathisia:  No  Handed:  Right  AIMS (if indicated):     Assets:  Housing Leisure Time Physical Health Resilience Social Support  ADL's:  Intact  Cognition:  WNL  Sleep:        Treatment Plan Summary: Daily contact with patient to assess and evaluate symptoms and progress in treatment, Medication management and Plan schizophrenia, undifferentiated:  -Crisis stabilization -Medication management:  Continued Latuda 40 mg daily for psychosis and Trazodone 50 mg at bedtime for sleep. -Individual counseling -Rx provided  Disposition: No evidence of imminent risk to self or others at present.    Nanine Means, NP 04/06/2016 10:51 AM  Patient seen face-to-face for psychiatric evaluation, chart reviewed and case discussed with the physician extender and developed treatment plan. Reviewed the information documented and agree with the treatment plan. Thedore Mins, MD

## 2016-04-06 NOTE — ED Notes (Signed)
Pt discharged home. Discharged instructions read to pt who verbalized understanding. All belongings returned to pt who signed for same. Denies SI/HI, is not delusional and not responding to internal stimuli. Escorted pt to the ED exit. Pt transported home by his mother.

## 2016-04-06 NOTE — BH Assessment (Signed)
[facility] Assessment Progress Note  Per Thedore MinsMojeed Akintayo, MD, this pt does not require psychiatric hospitalization at this time.  Pt presents under IVC initiated by his mother, which Dr Jannifer FranklinAkintayo has rescinded.  [facility].  This has been included in pt's discharge instructions.  Pt's nurse, Kendal Hymendie, has been notified.  Doylene Canninghomas Rema Lievanos, MA Triage Specialist [phone]

## 2016-04-06 NOTE — Discharge Instructions (Signed)
For your ongoing mental health needs, you are advised to follow up with [facility].  If you do not currently have an appointment, new and returning patients are seen at their walk-in clinic.  Walk-in hours are Monday - Friday from 8:00 am - 3:00 pm.  Walk-in patients are seen on a first come, first served basis.  Try to arrive as early as possible for he best chance of being seen the same day: ° °     [facility] °     [address] °     [place], [place] [address] °     [phone] °

## 2016-04-07 NOTE — B[facility] Suicide Risk Assessment (Signed)
Suicide Risk Assessment  Discharge Assessment   [facility]   Principal Problem: Schizophrenia, undifferentiated (HCC) Discharge Diagnoses:  Patient Active Problem List   Diagnosis Date Noted  . Schizophrenia, undifferentiated (HCC) [F20.3] 08/17/2015    Priority: High  . Noncompliance [Z91.19] 03/27/2016  . Tobacco use disorder [F17.200] 10/27/2014    Total Time spent with patient: 30 minutes  Musculoskeletal: Strength & Muscle Tone: within normal limits Gait & Station: normal Patient leans: N/A  Psychiatric Specialty Exam: Physical Exam  Constitutional: He appears well-developed and well-nourished.  HENT:  Head: Normocephalic.  Neck: Normal range of motion.  Respiratory: Effort normal.  Musculoskeletal: Normal range of motion.  Neurological: He is alert.  Psychiatric: He has a normal mood and affect. His speech is normal and behavior is normal. Judgment and thought content normal. Cognition and memory are normal.    Review of Systems  All other systems reviewed and are negative.   Blood pressure 117/84, pulse 88, temperature 98 F (36.7 C), temperature source Oral, resp. rate 17, SpO2 96 %.There is no height or weight on file to calculate BMI.  General Appearance: Casual  Eye Contact:  Good  Speech:  Normal Rate  Volume:  Normal  Mood:  Euthymic  Affect:  Congruent  Thought Process:  Coherent and Descriptions of Associations: Intact  Orientation:  Full (Time, Place, and Person)  Thought Content:  WDL and Logical  Suicidal Thoughts:  No  Homicidal Thoughts:  No  Memory:  Immediate;   Good Recent;   Good Remote;   Good  Judgement:  Good  Insight:  Good  Psychomotor Activity:  Normal  Concentration:  Concentration: Good and Attention Span: Good  Recall:  Good  Fund of Knowledge:  Good  Language:  Good  Akathisia:  No  Handed:  Right  AIMS (if indicated):     Assets:  Housing Leisure Time Physical Health Resilience Social  Support  ADL's:  Intact  Cognition:  WNL  Sleep:      Mental Status Per Nursing Assessment::   On Admission:   psychosis  Demographic Factors:  Male and Caucasian  Loss Factors: NA  Historical Factors: NA  Risk Reduction Factors:   Sense of responsibility to family, Living with another person, especially a relative, Positive social support and Positive therapeutic relationship  Continued Clinical Symptoms:  None  Cognitive Features That Contribute To Risk:  None    Suicide Risk:  Minimal: No identifiable suicidal ideation.  Patients presenting with no risk factors but with morbid ruminations; may be classified as minimal risk based on the severity of the depressive symptoms    Plan Of Care/Follow-up recommendations:  Activity:  as tolerated Diet:  heart healthy diet  LORD, JAMISON, NP 04/07/2016, 10:56 AM

## 2016-04-09 ENCOUNTER — Encounter (HOSPITAL_COMMUNITY): Payer: Self-pay

## 2016-04-09 ENCOUNTER — Emergency Department (HOSPITAL_COMMUNITY)
Admission: EM | Admit: 2016-04-09 | Discharge: 2016-04-09 | Disposition: A | Discharge status: Home / Self Care | Payer: Self-pay | Attending: Emergency Medicine | Admitting: Emergency Medicine

## 2016-04-09 ENCOUNTER — Emergency Department (HOSPITAL_COMMUNITY): Payer: Self-pay

## 2016-04-09 DIAGNOSIS — R109 Unspecified abdominal pain: Secondary | ICD-10-CM | POA: Insufficient documentation

## 2016-04-09 DIAGNOSIS — F17210 Nicotine dependence, cigarettes, uncomplicated: Secondary | ICD-10-CM | POA: Insufficient documentation

## 2016-04-09 DIAGNOSIS — K0889 Other specified disorders of teeth and supporting structures: Secondary | ICD-10-CM | POA: Insufficient documentation

## 2016-04-09 LAB — URINALYSIS, ROUTINE W REFLEX MICROSCOPIC
Bilirubin Urine: NEGATIVE
GLUCOSE, UA: NEGATIVE mg/dL
HGB URINE DIPSTICK: NEGATIVE
Ketones, ur: NEGATIVE mg/dL
Leukocytes, UA: NEGATIVE
Nitrite: NEGATIVE
PH: 6 (ref 5.0–8.0)
PROTEIN: NEGATIVE mg/dL
Specific Gravity, Urine: >1.03 — ABNORMAL HIGH (ref 1.005–1.030)

## 2016-04-09 MED ORDER — NAPROXEN 500 MG PO TABS: ORAL_TABLET | ORAL | 0 refills | Status: DC

## 2016-04-09 MED ORDER — CYCLOBENZAPRINE HCL 5 MG PO TABS: 5.0000 mg | ORAL_TABLET | Freq: Three times a day (TID) | ORAL | 0 refills | Status: DC | PRN

## 2016-04-09 MED ORDER — KETOROLAC TROMETHAMINE 30 MG/ML IJ SOLN
60.0000 mg | Freq: Once | INTRAMUSCULAR | Status: DC
  Filled 2016-04-09: qty 2

## 2016-04-09 NOTE — ED Notes (Signed)
Patient transported to CT 

## 2016-04-09 NOTE — ED Triage Notes (Signed)
Pt complaining of L sided kidney swelling. Pt states ongoing x 3 weeks. Pt also complaining of bilateral tooth pain. Pt states back top, bottom, left and right. Pt states malodorous urine.

## 2016-04-09 NOTE — ED Provider Notes (Signed)
[facility]Y DEPT Provider Note   CSN: [id number] Arrival date & time: 04/09/16  0002  Time seen 04:28 AM   History   Chief Complaint Chief Complaint  Patient presents with  . Dysuria  . Dental Pain    HPI Shane Allen is a 31 y.o. male.  HPI patient is here for his mother. He states he started getting some intermittent left flank pain about 2 weeks ago. He states it would last about 30 minutes. He states if he lays flat on his back it hurts more, if he doesn't lay on his back it feels better. He states he's had a cough for a few weeks but it's better now. He denies nausea, vomiting, diarrhea, fever, chills, dysuria, frequency, or hematuria. He states he has slight pain now. He denies any fall or injury.  Dates in the morning he wakes up with back pain which is different from this pain. He states his bed is broken and it is laying at a slope. When he gets up in the morning he gets sharp muscle spasms which go away after he gets up.  PCP none  Past Medical History:  Diagnosis Date  . 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  [name] affective schizophrenia [facility])     Patient Active Problem List   Diagnosis Date Noted  . Noncompliance 03/27/2016  . 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 Tobacco use disorder 10/27/2014  applying for disability for mental disorder [facility]  Past Surgical History:  Procedure Laterality Date  . NO PAST SURGERIES         Home Medications    Prior to Admission medications   Medication Sig Start Date End Date Taking? Authorizing Provider  lurasidone (LATUDA) 40 MG TABS tablet Take 1 tablet (40 mg total) by mouth daily before supper. 04/06/16  Yes Charm Rings, NP  traZODone (DESYREL) 50 MG tablet Take 1 tablet (50 mg total) by mouth at bedtime as needed for sleep. 04/06/16  Yes Charm Rings, NP  cyclobenzaprine (FLEXERIL) 5 MG tablet Take 1 tablet (5 mg total)  by mouth 3 (three) times daily as needed for muscle spasms. 04/09/16   Devoria Albe, MD  naproxen (NAPROSYN) 500 MG tablet Take 1 po BID with food prn pain 04/09/16   Devoria Albe, MD    Family History History reviewed. No pertinent family history.  Social History Social History  Substance Use Topics  . Smoking status: Current Every Day Smoker    Packs/day: 1.00    Years: 10.00    Types: Cigarettes  . Smokeless tobacco: Never Used  . Alcohol use No     Comment: 4 to 5 beers a day every other week for sleep     Allergies   Haldol [haloperidol lactate]; Maalox [calcium carbonate antacid]; and Tylenol with codeine #3 [acetaminophen-codeine]   Review of Systems Review of Systems  All other systems reviewed and are negative.    Physical Exam Updated Vital Signs BP 125/72   Pulse 89   Temp 98.5 F (36.9 C) (Oral)   Resp 16   SpO2 100%   Vital signs normal    Physical Exam  Constitutional: He is oriented to person, place, and time. He appears well-developed and well-nourished.  Non-toxic appearance. He does not appear ill. No distress.  HENT:  Head: Normocephalic and atraumatic.  Right Ear: External ear normal.  Left Ear: External ear normal.  Nose: Nose normal. No mucosal edema or rhinorrhea.  Mouth/Throat: Oropharynx is clear and moist and mucous membranes are normal. No dental abscesses or uvula swelling.  Eyes: Conjunctivae and EOM are normal. Pupils are equal, round, and reactive to light.  Neck: Normal range of motion and full passive range of motion without pain. Neck supple.  Cardiovascular: Normal rate, regular rhythm and normal heart sounds.  Exam reveals no gallop and no friction rub.   No murmur heard. Pulmonary/Chest: Effort normal and breath sounds normal. No respiratory distress. He has no wheezes. He has no rhonchi. He has no rales. He exhibits no tenderness and no crepitus.  Abdominal: Soft. Normal appearance and bowel sounds are normal. He exhibits no  distension. There is no tenderness. There is no rebound and no guarding.  Musculoskeletal: Normal range of motion. He exhibits no edema or tenderness.       Back:  Moves all extremities well. Area of pain noted. He is nontender in his thoracic or lumbar spine. He is nontender over the SI joints. He does not have pain to percussing his CVA laterally. He has no pain on range of motion at the waist.  Neurological: He is alert and oriented to person, place, and time. He has normal strength. No cranial nerve deficit.  Skin: Skin is warm, dry and intact. No rash noted. No erythema. No pallor.  Psychiatric: He has a normal mood and affect. His speech is normal and behavior is normal. His mood appears not anxious.  Nursing note and vitals reviewed.    ED Treatments / Results  Labs (all labs ordered are listed, but only abnormal results are displayed) Results for orders placed or performed during the hospital encounter of 04/09/16  Urinalysis, Routine w reflex microscopic  Result Value Ref Range   Color, Urine YELLOW YELLOW   APPearance CLEAR CLEAR   Specific Gravity, Urine >1.030 (H) 1.005 - 1.030   pH 6.0 5.0 - 8.0   Glucose, UA NEGATIVE NEGATIVE mg/dL   Hgb urine dipstick NEGATIVE NEGATIVE   Bilirubin Urine NEGATIVE NEGATIVE   Ketones, ur NEGATIVE NEGATIVE mg/dL   Protein, ur NEGATIVE NEGATIVE mg/dL   Nitrite NEGATIVE NEGATIVE   Leukocytes, UA NEGATIVE NEGATIVE   Laboratory interpretation all normal except concentrated  BMET    Component Value Date/Time   NA 136 04/04/2016 0101   NA 138 07/01/2012 0033   K 4.2 04/04/2016 0101   K 4.2 07/01/2012 0033   CL 100 (L) 04/04/2016 0101   CL 104 07/01/2012 0033   CO2 26 04/04/2016 0101   CO2 27 07/01/2012 0033   GLUCOSE 96 04/04/2016 0101   GLUCOSE 90 07/01/2012 0033   BUN 10 04/04/2016 0101   BUN 15 07/01/2012 0033   CREATININE 0.85 04/04/2016 0101   CREATININE 0.78 07/01/2012 0033   CALCIUM 9.3 04/04/2016 0101   CALCIUM 9.1  07/01/2012 0033   GFRNONAA >60 04/04/2016 0101   GFRNONAA >60 07/01/2012 0033   GFRAA >60 04/04/2016 0101   GFRAA >60 07/01/2012 0033     EKG  EKG Interpretation None       Radiology Ct Renal Stone Study  Result Date: 04/09/2016 CLINICAL DATA:  Left flank pain EXAM: CT ABDOMEN AND PELVIS WITHOUT CONTRAST TECHNIQUE: Multidetector CT imaging of the abdomen and pelvis was performed following the standard protocol without IV contrast. COMPARISON:  None. FINDINGS: Lower chest: Lung bases are clear. Hepatobiliary: No focal liver abnormality is seen. No gallstones, gallbladder wall thickening, or biliary dilatation. Pancreas: Unremarkable.  No pancreatic ductal dilatation or surrounding inflammatory changes. Spleen: Normal in size without focal abnormality. Adrenals/Urinary Tract: Adrenal glands are unremarkable. Kidneys are normal, without renal calculi, focal lesion, or hydronephrosis. Bladder is unremarkable. Stomach/Bowel: Stomach is within normal limits. Appendix appears normal. No evidence of bowel wall thickening, distention, or inflammatory changes. Vascular/Lymphatic: No significant vascular findings are present. No enlarged abdominal or pelvic lymph nodes. Reproductive:  Prostate gland is not enlarged. Other: No abdominal wall hernia or abnormality. No abdominopelvic ascites. Musculoskeletal: Degenerative changes in the spine. No destructive bone lesions. IMPRESSION: No renal or ureteral stone or obstruction. No acute process demonstrated in the abdomen or pelvis on noncontrast imaging. Electronically Signed   By: Burman NievesWilliam  Stevens M.D.   On: 04/09/2016 05:10    Procedures Procedures (including critical care time)  Medications Ordered in ED Medications  ketorolac (TORADOL) 30 MG/ML injection 60 mg (60 mg Intramuscular Refused 04/09/16 0539)     Initial Impression / Assessment and Plan / ED Course  I have reviewed the triage vital signs and the nursing notes.  Pertinent labs & imaging  results that were available during my care of the patient were reviewed by me and considered in my medical decision making (see chart for details).  Patient has  normal renal function on recent blood work. He was given Toradol IM for pain. CT renal was ordered to further evaluate his complaints of pain.  Despite reassuring him there were no warnings with the IM Toradol and his psychiatric meds, patient refused the medication.  MOP and patient given the results of his CT scan. He is resting comfortable. Patient's pain is most likely musculoskeletal. He was discharged home on a nonsteroidal anti-inflammatory and a muscle relaxer. Most likely he will not take it because he is afraid of interaction with his psychiatric meds.  Final Clinical Impressions(s) / ED Diagnoses   Final diagnoses:  Left flank pain    New Prescriptions New Prescriptions   CYCLOBENZAPRINE (FLEXERIL) 5 MG TABLET    Take 1 tablet (5 mg total) by mouth 3 (three) times daily as needed for muscle spasms.   NAPROXEN (NAPROSYN) 500 MG TABLET    Take 1 po BID with food prn pain    Plan discharge  Devoria AlbeIva Skylier Kretschmer, MD, [facility]    Brandelyn Henne, MD 04/09/16 [phone]

## 2016-04-09 NOTE — ED Notes (Signed)
Pt reporting left flank pain, dark/foul smelling urine. Pt states that he does not drink very much water and drinks a lot of coffee. He is also reporting dental pain to his back molars on his R/Lside on the top and bottom

## 2016-04-09 NOTE — Discharge Instructions (Signed)
Take the naproxen and flexeril for your back pain. You can use heat and ice as needed for comfort. Recheck if you get a fever, or you have uncontrolled vomiting or pain.

## 2016-05-18 ENCOUNTER — Emergency Department (HOSPITAL_COMMUNITY)
Admission: EM | Admit: 2016-05-18 | Discharge: 2016-05-19 | Disposition: A | Discharge status: Home / Self Care | Payer: Self-pay | Attending: Emergency Medicine | Admitting: Emergency Medicine

## 2016-05-18 DIAGNOSIS — F203 Undifferentiated schizophrenia: Secondary | ICD-10-CM | POA: Diagnosis present

## 2016-05-18 DIAGNOSIS — F209 Schizophrenia, unspecified: Secondary | ICD-10-CM | POA: Insufficient documentation

## 2016-05-18 DIAGNOSIS — Z79899 Other long term (current) drug therapy: Secondary | ICD-10-CM | POA: Insufficient documentation

## 2016-05-18 LAB — COMPREHENSIVE METABOLIC PANEL
ALK PHOS: 65 U/L (ref 38–126)
ALT: 35 U/L (ref 17–63)
ANION GAP: 9 (ref 5–15)
AST: 26 U/L (ref 15–41)
Albumin: 4.3 g/dL (ref 3.5–5.0)
BILIRUBIN TOTAL: 0.7 mg/dL (ref 0.3–1.2)
BUN: 7 mg/dL (ref 6–20)
CALCIUM: 9.7 mg/dL (ref 8.9–10.3)
CO2: 24 mmol/L (ref 22–32)
CREATININE: 0.96 mg/dL (ref 0.61–1.24)
Chloride: 103 mmol/L (ref 101–111)
GFR calc non Af Amer: >60 mL/min (ref >60)
Glucose, Bld: 114 mg/dL — ABNORMAL HIGH (ref 65–99)
Potassium: 3.7 mmol/L (ref 3.5–5.1)
Sodium: 136 mmol/L (ref 135–145)
TOTAL PROTEIN: 7.5 g/dL (ref 6.5–8.1)

## 2016-05-18 LAB — CBC
HEMATOCRIT: 42.2 % (ref 39.0–52.0)
Hemoglobin: 14.7 g/dL (ref 13.0–17.0)
MCH: 31.7 pg (ref 26.0–34.0)
MCHC: 34.8 g/dL (ref 30.0–36.0)
MCV: 90.9 fL (ref 78.0–100.0)
Platelets: 325 10*3/uL (ref 150–400)
RBC: 4.64 MIL/uL (ref 4.22–5.81)
RDW: 12.9 % (ref 11.5–15.5)
WBC: 10.4 10*3/uL (ref 4.0–10.5)

## 2016-05-18 LAB — RAPID URINE DRUG SCREEN, HOSP PERFORMED
Amphetamines: NOT DETECTED
BARBITURATES: NOT DETECTED
Benzodiazepines: NOT DETECTED
COCAINE: NOT DETECTED
Opiates: NOT DETECTED
Tetrahydrocannabinol: NOT DETECTED

## 2016-05-18 LAB — ETHANOL: Alcohol, Ethyl (B): <5 mg/dL (ref <5)

## 2016-05-18 LAB — ACETAMINOPHEN LEVEL

## 2016-05-18 LAB — SALICYLATE LEVEL

## 2016-05-18 MED ORDER — LORAZEPAM 1 MG PO TABS: 0.0000 mg | ORAL_TABLET | Freq: Four times a day (QID) | ORAL | Status: DC

## 2016-05-18 MED ORDER — VITAMIN B-1 100 MG PO TABS
100.0000 mg | ORAL_TABLET | Freq: Every day | ORAL | Status: DC
  Administered 2016-05-18: 100 mg via ORAL
  Filled 2016-05-18 (×3): qty 1

## 2016-05-18 MED ORDER — THIAMINE HCL 100 MG/ML IJ SOLN: 100.0000 mg | Freq: Every day | INTRAMUSCULAR | Status: DC

## 2016-05-18 MED ORDER — LURASIDONE HCL 40 MG PO TABS
40.0000 mg | ORAL_TABLET | Freq: Every day | ORAL | Status: DC
  Administered 2016-05-18: 40 mg via ORAL
  Filled 2016-05-18: qty 1

## 2016-05-18 MED ORDER — LORAZEPAM 1 MG PO TABS: 0.0000 mg | ORAL_TABLET | Freq: Two times a day (BID) | ORAL | Status: DC

## 2016-05-18 MED ORDER — TRAZODONE HCL 50 MG PO TABS: 50.0000 mg | ORAL_TABLET | Freq: Every evening | ORAL | Status: DC | PRN

## 2016-05-18 NOTE — ED Notes (Signed)
Patient states that he had an argument with his brother.  He states his brother IVC'd him "because of the argument."  Patient states that he has not drank any alcohol for approximately a year.  He states, "I don't want to hurt myself or anyone else."  Patient states, "they are saying I have a drinking problem because I have in the past, but not recently."  Patient denies any withdrawal symptoms.  Will hold the ativan and assess as needed.  Patient's vital wnl.  Patient does have a hx of schizophrenia.  He does appear to be responding to internal stimuli at this time.

## 2016-05-18 NOTE — ED Notes (Signed)
Patient denies SI,HI and AVH at this time. Plan of care discussed with patient. Encouragement and support provided and safety maintain. Q 15 min safety checks remain in place and video monitoring.
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
## 2016-05-18 NOTE — BH Assessment (Signed)
[facility] Assessment Progress Note   Case was staffed with Melvyn NethLewis FNP who recommended patient be admitted due to history and contents of IVC. Lewis FNP recommended a inpatient admission as appropriate bed placement is investigated.

## 2016-05-19 MED ORDER — DIVALPROEX SODIUM 250 MG PO DR TAB: 250.0000 mg | DELAYED_RELEASE_TABLET | Freq: Two times a day (BID) | ORAL | Status: DC

## 2016-05-19 MED ORDER — DIVALPROEX SODIUM 250 MG PO DR TAB: 250.0000 mg | DELAYED_RELEASE_TABLET | Freq: Three times a day (TID) | ORAL | Status: DC

## 2016-05-19 MED ORDER — LURASIDONE HCL 80 MG PO TABS
80.0000 mg | ORAL_TABLET | Freq: Every day | ORAL | Status: DC
  Filled 2016-05-19: qty 1

## 2016-05-19 MED ORDER — DIVALPROEX SODIUM 250 MG PO DR TAB
250.0000 mg | DELAYED_RELEASE_TABLET | Freq: Two times a day (BID) | ORAL | Status: DC
  Administered 2016-05-19: 250 mg via ORAL
  Filled 2016-05-19: qty 1

## 2016-05-19 MED ORDER — DIVALPROEX SODIUM 500 MG PO DR TAB: 500.0000 mg | DELAYED_RELEASE_TABLET | Freq: Two times a day (BID) | ORAL | Status: DC

## 2016-05-19 NOTE — ED Notes (Signed)
Pt has tried multiple times to call his mother to take him home and she has not answered. [facility] because they brought him in.
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
## 2016-05-19 NOTE — Progress Notes (Signed)
05/19/16 1352:  LRT introduced self and offered activities, pt declined stating he was about to be discharged.  Caroll RancherMarjette Tasha Jindra, LRT/CTRS

## 2016-05-19 NOTE — ED Notes (Signed)
Pt transported home by [employer]. All belongings returned pt who signed for same.

## 2016-05-19 NOTE — ED Notes (Signed)
Patient has been pleasant, calm and cooperative during this shift. Patient has responded appropriate with staff and his peers. Patient exhibits mo signs and symptoms of withdrawal. Patient continues to deny SI,Hi and AVH at this time. Encouragement and support provide and safety maintain. Q 15 min safety checks remain in place and video monitoring.

## 2016-05-19 NOTE — Discharge Instructions (Signed)
For your ongoing mental health needs, you are advised to continue treatment with [facility].  If you do not currently have an appointment, new and returning patients are seen at their walk-in clinic.  Walk-in hours are Monday - Friday from 8:00 am - 3:00 pm.  Walk-in patients are seen on a first come, first served basis.  Try to arrive as early as possible for he best chance of being seen the same day: ° °     [facility] °     [address] °     [place], [place] [address] °     [phone] °
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
## 2016-09-11 ENCOUNTER — Encounter (HOSPITAL_COMMUNITY): Payer: Self-pay | Admitting: Emergency Medicine

## 2016-09-11 ENCOUNTER — Emergency Department (HOSPITAL_COMMUNITY)
Admission: EM | Admit: 2016-09-11 | Discharge: 2016-09-11 | Disposition: A | Discharge status: Home / Self Care | Payer: Self-pay | Attending: Emergency Medicine | Admitting: Emergency Medicine

## 2016-09-11 DIAGNOSIS — F209 Schizophrenia, unspecified: Secondary | ICD-10-CM | POA: Insufficient documentation

## 2016-09-11 DIAGNOSIS — Z8659 Personal history of other mental and behavioral disorders: Secondary | ICD-10-CM

## 2016-09-11 DIAGNOSIS — Z5321 Procedure and treatment not carried out due to patient leaving prior to being seen by health care provider: Secondary | ICD-10-CM | POA: Insufficient documentation

## 2016-09-11 LAB — COMPREHENSIVE METABOLIC PANEL
ALBUMIN: 4.6 g/dL (ref 3.5–5.0)
ALT: 50 U/L (ref 17–63)
ANION GAP: 10 (ref 5–15)
AST: 30 U/L (ref 15–41)
Alkaline Phosphatase: 74 U/L (ref 38–126)
BUN: 6 mg/dL (ref 6–20)
CHLORIDE: 104 mmol/L (ref 101–111)
CO2: 23 mmol/L (ref 22–32)
Calcium: 9.7 mg/dL (ref 8.9–10.3)
Creatinine, Ser: 1.11 mg/dL (ref 0.61–1.24)
GFR calc non Af Amer: >60 mL/min (ref >60)
GLUCOSE: 148 mg/dL — AB (ref 65–99)
Potassium: 4 mmol/L (ref 3.5–5.1)
SODIUM: 137 mmol/L (ref 135–145)
Total Bilirubin: 0.5 mg/dL (ref 0.3–1.2)
Total Protein: 7.4 g/dL (ref 6.5–8.1)

## 2016-09-11 LAB — CBC WITH DIFFERENTIAL/PLATELET
BASOS PCT: 0 %
Basophils Absolute: 0 10*3/uL (ref 0.0–0.1)
EOS ABS: 0.1 10*3/uL (ref 0.0–0.7)
EOS PCT: 1 %
HCT: 46.8 % (ref 39.0–52.0)
HEMOGLOBIN: 15.7 g/dL (ref 13.0–17.0)
LYMPHS ABS: 2.4 10*3/uL (ref 0.7–4.0)
Lymphocytes Relative: 23 %
MCH: 32.1 pg (ref 26.0–34.0)
MCHC: 33.5 g/dL (ref 30.0–36.0)
MCV: 95.7 fL (ref 78.0–100.0)
Monocytes Absolute: 0.9 10*3/uL (ref 0.1–1.0)
Monocytes Relative: 8 %
NEUTROS PCT: 68 %
Neutro Abs: 7 10*3/uL (ref 1.7–7.7)
PLATELETS: 332 10*3/uL (ref 150–400)
RBC: 4.89 MIL/uL (ref 4.22–5.81)
RDW: 14.1 % (ref 11.5–15.5)
WBC: 10.3 10*3/uL (ref 4.0–10.5)

## 2016-09-11 LAB — ETHANOL

## 2016-09-11 LAB — RAPID URINE DRUG SCREEN, HOSP PERFORMED
AMPHETAMINES: NOT DETECTED
BENZODIAZEPINES: NOT DETECTED
Barbiturates: NOT DETECTED
Cocaine: NOT DETECTED
Opiates: NOT DETECTED
TETRAHYDROCANNABINOL: NOT DETECTED

## 2016-09-11 NOTE — ED Triage Notes (Signed)
Patient requesting psychiatric evaluation , reports auditory/visual hallucinations with insomnia , denies suicidal ideation - he has not taken his antipsychotic medication for >1 month .

## 2016-09-11 NOTE — ED Provider Notes (Signed)
Signed up to see patient, however after being placed into room and asked to change into scrubs for evaluation I was notified that patient left.  He told RN he was only here for prescriptions but would follow-up with [facility] on Monday for these.  Patient left with mother who was comfortable with this.  I did not get to see or evaluate patient.   Garlon HatchetSanders, Suann Klier M, PA-C 09/11/16 2214    Cathren LaineSteinl, Kevin, MD 09/11/16 2300

## 2016-09-11 NOTE — ED Notes (Signed)
Entered the room and pt was sitting in a straight chair. Asked pt what size clothing he wore and the pt asked why. Explained to the pt and family that he needed to get dressed out and have security check him to make sure he didn't have any weapons. Pt stated he was just here to get a prescription and didn't want to speak to anyone from behavioral health. Explained to the pt that the behavioral health counselors were the experts and the ED providers would defer to their expertise. Pt advised he didn't want to be hospitalized and would follow up with [facility] on Monday. Pt's mother advised she was ok with that but hoped her son would stay. Pt still did not want to stay and be evaluated. Asked pt to at least speak to the ED provider and he still did not want to stay. Pt left with his mother AMA.

## 2017-01-11 ENCOUNTER — Emergency Department
Admission: EM | Admit: 2017-01-11 | Discharge: 2017-01-11 | Disposition: A | Discharge status: Home / Self Care | Payer: Medicaid Other | Attending: Emergency Medicine | Admitting: Emergency Medicine

## 2017-01-11 DIAGNOSIS — Z760 Encounter for issue of repeat prescription: Secondary | ICD-10-CM | POA: Insufficient documentation

## 2017-01-11 DIAGNOSIS — Z79899 Other long term (current) drug therapy: Secondary | ICD-10-CM | POA: Diagnosis not present

## 2017-01-11 DIAGNOSIS — F17210 Nicotine dependence, cigarettes, uncomplicated: Secondary | ICD-10-CM | POA: Insufficient documentation

## 2017-01-11 DIAGNOSIS — F209 Schizophrenia, unspecified: Secondary | ICD-10-CM

## 2017-01-11 DIAGNOSIS — F259 Schizoaffective disorder, unspecified: Secondary | ICD-10-CM | POA: Diagnosis not present

## 2017-01-11 LAB — COMPREHENSIVE METABOLIC PANEL
ALBUMIN: 4.8 g/dL (ref 3.5–5.0)
ALK PHOS: 82 U/L (ref 38–126)
ALT: 40 U/L (ref 17–63)
AST: 26 U/L (ref 15–41)
Anion gap: 11 (ref 5–15)
BUN: 12 mg/dL (ref 6–20)
CALCIUM: 9.8 mg/dL (ref 8.9–10.3)
CO2: 24 mmol/L (ref 22–32)
CREATININE: 1.06 mg/dL (ref 0.61–1.24)
Chloride: 103 mmol/L (ref 101–111)
GFR calc Af Amer: >60 mL/min (ref >60)
GFR calc non Af Amer: >60 mL/min (ref >60)
GLUCOSE: 121 mg/dL — AB (ref 65–99)
Potassium: 3.7 mmol/L (ref 3.5–5.1)
SODIUM: 138 mmol/L (ref 135–145)
Total Bilirubin: 0.7 mg/dL (ref 0.3–1.2)
Total Protein: 8.1 g/dL (ref 6.5–8.1)

## 2017-01-11 LAB — CBC
HEMATOCRIT: 48.9 % (ref 40.0–52.0)
HEMOGLOBIN: 17.1 g/dL (ref 13.0–18.0)
MCH: 32.6 pg (ref 26.0–34.0)
MCHC: 35 g/dL (ref 32.0–36.0)
MCV: 93.1 fL (ref 80.0–100.0)
PLATELETS: 364 10*3/uL (ref 150–440)
RBC: 5.25 MIL/uL (ref 4.40–5.90)
RDW: 13.3 % (ref 11.5–14.5)
WBC: 10.7 10*3/uL — ABNORMAL HIGH (ref 3.8–10.6)

## 2017-01-11 LAB — URINE DRUG SCREEN, QUALITATIVE (ARMC ONLY)
AMPHETAMINES, UR SCREEN: NOT DETECTED
Barbiturates, Ur Screen: NOT DETECTED
Benzodiazepine, Ur Scrn: NOT DETECTED
Cannabinoid 50 Ng, Ur ~~LOC~~: NOT DETECTED
Cocaine Metabolite,Ur ~~LOC~~: NOT DETECTED
MDMA (ECSTASY) UR SCREEN: NOT DETECTED
Methadone Scn, Ur: NOT DETECTED
Opiate, Ur Screen: NOT DETECTED
Phencyclidine (PCP) Ur S: NOT DETECTED
TRICYCLIC, UR SCREEN: NOT DETECTED

## 2017-01-11 LAB — SALICYLATE LEVEL: Salicylate Lvl: <7 mg/dL (ref 2.8–30.0)

## 2017-01-11 LAB — ETHANOL: Alcohol, Ethyl (B): <10 mg/dL (ref <10)

## 2017-01-11 LAB — ACETAMINOPHEN LEVEL: Acetaminophen (Tylenol), Serum: <10 ug/mL — ABNORMAL LOW (ref 10–30)

## 2017-01-11 MED ORDER — LURASIDONE HCL 40 MG PO TABS: 40.0000 mg | ORAL_TABLET | Freq: Every day | ORAL | 0 refills | Status: DC

## 2017-01-11 MED ORDER — LURASIDONE HCL 40 MG PO TABS
40.0000 mg | ORAL_TABLET | Freq: Every day | ORAL | Status: DC
  Administered 2017-01-11: 40 mg via ORAL
  Filled 2017-01-11: qty 1

## 2017-01-11 NOTE — ED Triage Notes (Signed)
Patient's mother reports that patient is out of medication because he became frustrated and threw out the rest of his medications.

## 2017-01-11 NOTE — ED Triage Notes (Signed)
Patient reports that he is out of his medications for Schizophrenia and cannot afford to by more. Patient reports voices are 'very bad right now'. Patient denies that they are telling him to hurt himself of others. Patient denies SI/HI. Patient would like medication assistance.

## 2017-01-11 NOTE — Discharge Instructions (Signed)
Please keep your appointment to follow-up with your psychiatrist as scheduled and return to the emergency department for any concerns.  It was a pleasure to take care of you today, and thank you for coming to our emergency department.  If you have any questions or concerns before leaving please ask the nurse to grab me and I'm more than happy to go through your aftercare instructions again.  If you were prescribed any opioid pain medication today such as Norco, Vicodin, Percocet, morphine, hydrocodone, or oxycodone please make sure you do not drive when you are taking this medication as it can alter your ability to drive safely.  If you have any concerns once you are home that you are not improving or are in fact getting worse before you can make it to your follow-up appointment, please do not hesitate to call 911 and come back for further evaluation.  Merrily BrittleNeil Silus Lanzo, MD  Results for orders placed or performed during the hospital encounter of 01/11/17  cbc  Result Value Ref Range   WBC 10.7 (H) 3.8 - 10.6 K/uL   RBC 5.25 4.40 - 5.90 MIL/uL   Hemoglobin 17.1 13.0 - 18.0 g/dL   HCT 08.648.9 57.840.0 - 46.952.0 %   MCV 93.1 80.0 - 100.0 fL   MCH 32.6 26.0 - 34.0 pg   MCHC 35.0 32.0 - 36.0 g/dL   RDW 62.913.3 52.811.5 - 41.314.5 %   Platelets 364 150 - 440 K/uL

## 2017-01-11 NOTE — ED Provider Notes (Signed)
[facility] Emergency Department Provider Note  ____________________________________________   First MD Initiated Contact with Patient 01/11/17 2102     (approximate)  I have reviewed the triage vital signs and the nursing notes.   HISTORY  Chief Complaint Mental Health Problem    HPI Shane Allen is a 31 y.o. male who self presents to the emergency department requesting a refill of his psychiatric medications.  He has a past medical history of schizophrenia for which he takes Latuda 40 mg a day.  Several weeks ago he got into an argument at home and throughout his pills and he has not had any of his medications in about 2-1/2 weeks.  He said that he attempted to refill his medications however he is not able to refill them for another 10 days.  Is here today with no acute complaints except that he has no money and cannot afford his medications at this time.  He denies suicidality he denies homicidality.  He requests only a medication refill and assistance paying for his medications.  Past Medical History:  Diagnosis Date  . 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  [name]. Schizo affective schizophrenia [facility](HCC)     Patient Active Problem List   Diagnosis Date Noted  . Noncompliance 03/27/2016  . 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 Tobacco use disorder 10/27/2014    Past Surgical History:  Procedure Laterality Date  . NO PAST SURGERIES      Prior to Admission medications   Medication Sig Start Date End Date Taking? Authorizing Provider  lurasidone (LATUDA) 40 MG TABS tablet Take 40 mg daily with breakfast by mouth.   Yes [provider]  divalproex (DEPAKOTE) 250 MG DR tablet Take 250 mg by mouth 3 (three) times daily.    [provider]  lurasidone (LATUDA) 40 MG TABS tablet Take 1 tablet (40 mg total) daily with breakfast by mouth. 01/11/17   Merrily Brittleifenbark, Zygmunt Mcglinn, MD    traZODone (DESYREL) 50 MG tablet Take 1 tablet (50 mg total) by mouth at bedtime as needed for sleep. 04/06/16   Charm RingsLord, Jamison Y, NP    Allergies Haldol [haloperidol lactate]; Maalox [calcium carbonate antacid]; and Tylenol with codeine #3 [acetaminophen-codeine]  No family history on file.  Social History Social History   Tobacco Use  . Smoking status: Current Every Day Smoker    Packs/day: 1.00    Years: 10.00    Pack years: 10.00    Types: Cigarettes  . Smokeless tobacco: Never Used  Substance Use Topics  . Alcohol use: No    Comment: 4 to 5 beers a day every other week for sleep  . Drug use: No    Review of Systems Constitutional: No fever/chills Eyes: No visual changes. ENT: No sore throat. Cardiovascular: Denies chest pain. Respiratory: Denies shortness of breath. Gastrointestinal: No abdominal pain.  No nausea, no vomiting.  No diarrhea.  No constipation. Neurological: Negative for headaches, focal weakness or numbness.   ____________________________________________   PHYSICAL EXAM:  VITAL SIGNS: ED Triage Vitals  Enc Vitals Group     BP 01/11/17 2047 (!) 126/92     Pulse Rate 01/11/17 2047 82     Resp 01/11/17 2047 15     Temp 01/11/17 2047 98 F (36.7 C)     Temp Source 01/11/17 2047 Oral     SpO2 01/11/17 2047 98 %     Weight 01/11/17 2048  270 lb (122.5 kg)     Height --      Head Circumference --      Peak Flow --      Pain Score 01/11/17 2047 5     Pain Loc --      Pain Edu? --      Excl. in GC? --     Constitutional: Alert and oriented x4 pleasant cooperative speaks in full clear sentences no diaphoresis Eyes: PERRL EOMI. Head: Atraumatic. Nose: No congestion/rhinnorhea. Mouth/Throat: No trismus Neck: No stridor.   Cardiovascular: Normal rate, regular rhythm. Grossly normal heart sounds.  Good peripheral circulation. Respiratory: Normal respiratory effort.  No retractions. Lungs CTAB and moving good air Musculoskeletal: No lower  extremity edema   Neurologic:  Normal speech and language. No gross focal neurologic deficits are appreciated. Psychiatric: Mood and affect are normal. Speech and behavior are normal.    ____________________________________________   DIFFERENTIAL includes but not limited to  Schizophrenia, schizoaffective disorder, depression, medication refill, medication noncompliance ____________________________________________   LABS (all labs ordered are listed, but only abnormal results are displayed)  Labs Reviewed  COMPREHENSIVE METABOLIC PANEL - Abnormal; Notable for the following components:      Result Value   Glucose, Bld 121 (*)    All other components within normal limits  ACETAMINOPHEN LEVEL - Abnormal; Notable for the following components:   Acetaminophen (Tylenol), Serum <10 (*)    All other components within normal limits  CBC - Abnormal; Notable for the following components:   WBC 10.7 (*)    All other components within normal limits  ETHANOL  SALICYLATE LEVEL  URINE DRUG SCREEN, QUALITATIVE ([facility] ONLY)    Blood work reviewed by me shows no acute disease __________________________________________  EKG   ____________________________________________  RADIOLOGY   ____________________________________________   PROCEDURES  Procedure(s) performed: no  Procedures  Critical Care performed: no  Observation: no ____________________________________________   INITIAL IMPRESSION / ASSESSMENT AND PLAN / ED COURSE  Pertinent labs & imaging results that were available during my care of the patient were reviewed by me and considered in my medical decision making (see chart for details).      The patient has no acute medical complaints and denies suicidality or homicidality.  He has not had his anti-psychiatric medication in 2-1/2 weeks.  He is requesting a refill for 10 days.  We have given her first dose now and provided him information for medication assistance.   He verbalizes understanding and agree with the plan.  He does have a plan for follow-up with his psychiatrist in 10 days.  Strict return precautions have been given and the patient verbalized understanding and agreement with plan.  ____________________________________________   FINAL CLINICAL IMPRESSION(S) / ED DIAGNOSES  Final diagnoses:  Medication refill  Schizophrenia, unspecified type (HCC)      NEW MEDICATIONS STARTED DURING THIS VISIT:  This SmartLink is deprecated. Use AVSMEDLIST instead to display the medication list for a patient.   Note:  This document was prepared using Dragon voice recognition software and may include unintentional dictation errors.     Merrily Brittleifenbark, Cornella Emmer, MD 01/12/17 [phone]

## 2017-01-17 ENCOUNTER — Emergency Department
Admission: EM | Admit: 2017-01-17 | Discharge: 2017-01-18 | Disposition: A | Discharge status: Other Acute Inpatient Hospital | Payer: Medicaid Other | Attending: Emergency Medicine | Admitting: Emergency Medicine

## 2017-01-17 ENCOUNTER — Other Ambulatory Visit: Payer: Self-pay

## 2017-01-17 ENCOUNTER — Encounter: Payer: Self-pay | Admitting: Emergency Medicine

## 2017-01-17 DIAGNOSIS — Z79899 Other long term (current) drug therapy: Secondary | ICD-10-CM | POA: Insufficient documentation

## 2017-01-17 DIAGNOSIS — F17200 Nicotine dependence, unspecified, uncomplicated: Secondary | ICD-10-CM | POA: Diagnosis present

## 2017-01-17 DIAGNOSIS — Z9119 Patient's noncompliance with other medical treatment and regimen: Secondary | ICD-10-CM | POA: Insufficient documentation

## 2017-01-17 DIAGNOSIS — F17210 Nicotine dependence, cigarettes, uncomplicated: Secondary | ICD-10-CM | POA: Insufficient documentation

## 2017-01-17 DIAGNOSIS — F203 Undifferentiated schizophrenia: Secondary | ICD-10-CM | POA: Diagnosis present

## 2017-01-17 DIAGNOSIS — Z91199 Patient's noncompliance with other medical treatment and regimen due to unspecified reason: Secondary | ICD-10-CM

## 2017-01-17 DIAGNOSIS — F209 Schizophrenia, unspecified: Secondary | ICD-10-CM | POA: Insufficient documentation

## 2017-01-17 DIAGNOSIS — R441 Visual hallucinations: Secondary | ICD-10-CM | POA: Insufficient documentation

## 2017-01-17 LAB — COMPREHENSIVE METABOLIC PANEL
ALBUMIN: 4.7 g/dL (ref 3.5–5.0)
ALK PHOS: 81 U/L (ref 38–126)
ALT: 35 U/L (ref 17–63)
ANION GAP: 8 (ref 5–15)
AST: 25 U/L (ref 15–41)
BILIRUBIN TOTAL: 0.5 mg/dL (ref 0.3–1.2)
BUN: 10 mg/dL (ref 6–20)
CALCIUM: 9.4 mg/dL (ref 8.9–10.3)
CO2: 27 mmol/L (ref 22–32)
Chloride: 102 mmol/L (ref 101–111)
Creatinine, Ser: 1.27 mg/dL — ABNORMAL HIGH (ref 0.61–1.24)
GFR calc Af Amer: >60 mL/min (ref >60)
GLUCOSE: 100 mg/dL — AB (ref 65–99)
Potassium: 4.8 mmol/L (ref 3.5–5.1)
Sodium: 137 mmol/L (ref 135–145)
TOTAL PROTEIN: 8 g/dL (ref 6.5–8.1)

## 2017-01-17 LAB — CBC
HEMATOCRIT: 46.7 % (ref 40.0–52.0)
Hemoglobin: 15.9 g/dL (ref 13.0–18.0)
MCH: 32.1 pg (ref 26.0–34.0)
MCHC: 34 g/dL (ref 32.0–36.0)
MCV: 94.4 fL (ref 80.0–100.0)
PLATELETS: 342 10*3/uL (ref 150–440)
RBC: 4.94 MIL/uL (ref 4.40–5.90)
RDW: 13.2 % (ref 11.5–14.5)
WBC: 11.4 10*3/uL — ABNORMAL HIGH (ref 3.8–10.6)

## 2017-01-17 LAB — URINE DRUG SCREEN, QUALITATIVE (ARMC ONLY)
Amphetamines, Ur Screen: NOT DETECTED
BENZODIAZEPINE, UR SCRN: NOT DETECTED
Barbiturates, Ur Screen: NOT DETECTED
Cannabinoid 50 Ng, Ur ~~LOC~~: NOT DETECTED
Cocaine Metabolite,Ur ~~LOC~~: NOT DETECTED
MDMA (Ecstasy)Ur Screen: NOT DETECTED
Methadone Scn, Ur: NOT DETECTED
Opiate, Ur Screen: NOT DETECTED
Phencyclidine (PCP) Ur S: NOT DETECTED
Tricyclic, Ur Screen: NOT DETECTED

## 2017-01-17 LAB — ETHANOL

## 2017-01-17 LAB — ACETAMINOPHEN LEVEL: Acetaminophen (Tylenol), Serum: <10 ug/mL — ABNORMAL LOW (ref 10–30)

## 2017-01-17 LAB — VALPROIC ACID LEVEL

## 2017-01-17 LAB — SALICYLATE LEVEL: Salicylate Lvl: <7 mg/dL (ref 2.8–30.0)

## 2017-01-17 MED ORDER — LURASIDONE HCL 40 MG PO TABS
60.0000 mg | ORAL_TABLET | Freq: Every day | ORAL | Status: DC
  Administered 2017-01-17 – 2017-01-18 (×2): 60 mg via ORAL
  Filled 2017-01-17 (×3): qty 2
  Filled 2017-01-17 (×2): qty 1

## 2017-01-17 NOTE — ED Notes (Signed)
BEHAVIORAL HEALTH ROUNDING  Patient sleeping: No.  Patient alert and oriented: yes  Behavior appropriate: Yes. ; If no, describe:  Nutrition and fluids offered: Yes  Toileting and hygiene offered: Yes  Sitter present: not applicable, Q 15 min safety rounds and observation.  Law enforcement present: Yes ODS  

## 2017-01-17 NOTE — ED Notes (Signed)
BEHAVIORAL HEALTH ROUNDING Patient sleeping: Yes.   Patient alert and oriented: eyes closed  Appears to be asleep Behavior appropriate: Yes.  ; If no, describe:  Nutrition and fluids offered: Yes  Toileting and hygiene offered: sleeping Sitter present: q 15 minute observations and security camera monitoring Law enforcement present: yes  ODS 

## 2017-01-17 NOTE — ED Provider Notes (Signed)
[facility] Emergency Department Provider Note   ____________________________________________   First MD Initiated Contact with Patient 01/17/17 [phone]     (approximate)  I have reviewed the triage vital signs and the nursing notes.   HISTORY  Chief Complaint Hallucinations    HPI Shane Allen is a 31 y.o. male who presents to the ED from home with a chief complaint of "mental breakdown".  Patient has a history of schizophrenia; reports hearing demons but states the messages are nonthreatening.  Denies active SI or HI. Voices no medical complaints other than hunger.   Past Medical History:  Diagnosis Date  . 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  [name]. Schizo affective schizophrenia [facility](HCC)     Patient Active Problem List   Diagnosis Date Noted  . Noncompliance 03/27/2016  . 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 Tobacco use disorder 10/27/2014    Past Surgical History:  Procedure Laterality Date  . NO PAST SURGERIES      Prior to Admission medications   Medication Sig Start Date End Date Taking? Authorizing Provider  divalproex (DEPAKOTE) 250 MG DR tablet Take 250 mg by mouth 3 (three) times daily.    [provider]  lurasidone (LATUDA) 40 MG TABS tablet Take 40 mg daily with breakfast by mouth.    [provider]  lurasidone (LATUDA) 40 MG TABS tablet Take 1 tablet (40 mg total) daily with breakfast by mouth. 01/11/17   Merrily Brittleifenbark, Neil, MD  traZODone (DESYREL) 50 MG tablet Take 1 tablet (50 mg total) by mouth at bedtime as needed for sleep. 04/06/16   Charm RingsLord, Jamison Y, NP    Allergies Haldol [haloperidol lactate]; Maalox [calcium carbonate antacid]; and Tylenol with codeine #3 [acetaminophen-codeine]  No family history on file.  Social History Social History   Tobacco Use  . Smoking status: Current Every Day Smoker    Packs/day: 1.00    Years: 10.00      Pack years: 10.00    Types: Cigarettes  . Smokeless tobacco: Never Used  Substance Use Topics  . Alcohol use: No    Comment: 4 to 5 beers a day every other week for sleep  . Drug use: No    Review of Systems   Constitutional: No fever/chills. Eyes: No visual changes. ENT: No sore throat. Cardiovascular: Denies chest pain. Respiratory: Denies shortness of breath. Gastrointestinal: No abdominal pain.  No nausea, no vomiting.  No diarrhea.  No constipation. Genitourinary: Negative for dysuria. Musculoskeletal: Negative for back pain. Skin: Negative for rash. Neurological: Negative for headaches, focal weakness or numbness. Psychiatric:Positive for auditory hallucinations.  ____________________________________________   PHYSICAL EXAM:  VITAL SIGNS: ED Triage Vitals [01/17/17 0103]  Enc Vitals Group     BP 127/81     Pulse Rate 94     Resp 18     Temp 98.7 F (37.1 C)     Temp Source Oral     SpO2 98 %     Weight 265 lb (120.2 kg)     Height 5\' 11"  (1.803 m)     Head Circumference      Peak Flow      Pain Score      Pain Loc      Pain Edu?      Excl. in GC?     Constitutional: Alert and oriented. Well appearing and in no acute distress. Eyes: Conjunctivae are normal. PERRL. EOMI.  Head: Atraumatic. Nose: No congestion/rhinnorhea. Mouth/Throat: Mucous membranes are moist.  Oropharynx non-erythematous. Neck: No stridor.   Cardiovascular: Normal rate, regular rhythm. Grossly normal heart sounds.  Good peripheral circulation. Respiratory: Normal respiratory effort.  No retractions. Lungs CTAB. Gastrointestinal: Soft and nontender. No distention. No abdominal bruits. No CVA tenderness. Musculoskeletal: No lower extremity tenderness nor edema.  No joint effusions. Neurologic:  Normal speech and language. No gross focal neurologic deficits are appreciated. No gait instability. Skin:  Skin is warm, dry and intact. No rash noted. Psychiatric: Mood and affect are  flat. Speech and behavior are normal.  ____________________________________________   LABS (all labs ordered are listed, but only abnormal results are displayed)  Labs Reviewed  COMPREHENSIVE METABOLIC PANEL - Abnormal; Notable for the following components:      Result Value   Glucose, Bld 100 (*)    Creatinine, Ser 1.27 (*)    All other components within normal limits  ACETAMINOPHEN LEVEL - Abnormal; Notable for the following components:   Acetaminophen (Tylenol), Serum <10 (*)    All other components within normal limits  CBC - Abnormal; Notable for the following components:   WBC 11.4 (*)    All other components within normal limits  VALPROIC ACID LEVEL - Abnormal; Notable for the following components:   Valproic Acid Lvl <10 (*)    All other components within normal limits  ETHANOL  SALICYLATE LEVEL  URINE DRUG SCREEN, QUALITATIVE ([facility] ONLY)   ____________________________________________  EKG  None ____________________________________________  RADIOLOGY  No results found.  ____________________________________________   PROCEDURES  Procedure(s) performed: None  Procedures  Critical Care performed: No  ____________________________________________   INITIAL IMPRESSION / ASSESSMENT AND PLAN / ED COURSE  As part of my medical decision making, I reviewed the following data within the electronic MEDICAL RECORD NUMBER Nursing notes reviewed and incorporated, Labs reviewed, A consult was requested and obtained from this/these consultant [facility](SOC psychiatry) and Notes from prior ED visits.   31 year old male with schizophrenia who presents on a voluntary basis for evaluation of auditory hallucinations.  Laboratory and urinalysis results unremarkable.  Patient is medically cleared and will be kept in the ED on a voluntary basis pending TTS and [facility] consults.  Disposition per psychiatry.  Clinical Course as of Jan 17 558  Wynelle LinkSun Jan 17, 2017  [id number] Patient was  [facility] Dr. Alesia MorinMillan who recommend IVC and admission to inpatient psychiatry service.  Recommends increasing Latuda to 60 mg daily with meals.  [JS]    Clinical Course User Index [JS] Irean HongSung, Eun Vermeer J, MD     ____________________________________________   FINAL CLINICAL IMPRESSION(S) / ED DIAGNOSES  Final diagnoses:  Schizophrenia, unspecified type [facility](HCC)     ED Discharge Orders    None       Note:  This document was prepared using Dragon voice recognition software and may include unintentional dictation errors.    Irean HongSung, Wanita Derenzo J, MD 01/17/17 [phone]

## 2017-01-17 NOTE — ED Notes (Signed)

## 2017-01-17 NOTE — ED Notes (Signed)
Report called to Margaret, RN in the ED B[facility].  

## 2017-01-17 NOTE — ED Notes (Signed)
Mother called and stated, "they can not afford latuda, and he is no longer with [facility]". Mother verbalized that this is the worst she has seen him. That the voices are really bothering him. She would like him put on some medication that is reasonable price. Will inform Dr. Toni Amendlapacs.

## 2017-01-17 NOTE — ED Notes (Signed)

## 2017-01-17 NOTE — BH Assessment (Signed)
Referral information for  Placement have been faxed to:    [facility] [phone] or [phone])   [facility]([phone])   [place]([phone])   [facility] [phone])   [facility]- [phone])   [place]([phone])

## 2017-01-17 NOTE — ED Triage Notes (Signed)
Pt arrives ambulatory to triage with c/ of schizophrenia and "mental breakdown". Pt denies SI or HI at this time but does states that he is seeing things and hearing voices. Pt is in NAD at this time.

## 2017-01-17 NOTE — BH Assessment (Signed)
Assessment Note  Shane Allen is an 31 y.o. male. The pt came in due to psychosis.  He reported he is hearing and seeing demons.  About a month ago the pt reported he threw away his medication and was without the medication for about a week.  He has been back on his medication for about a week and a half.  He lives with his mother and denies any problems with her.  He also denies any other stressors.  He denies SA use and his UDS and ETOH test are both negative.  He denies SI and HI  Diagnosis: Schizophrenia  Past Medical History:  Past Medical History:  Diagnosis Date  . 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  Shane Allen. Schizo affective schizophrenia [facility](HCC)     Past Surgical History:  Procedure Laterality Date  . NO PAST SURGERIES      Family History: No family history on file.  Social History:  reports that he has been smoking cigarettes.  He has a 10.00 pack-year smoking history. he has never used smokeless tobacco. He reports that he does not drink alcohol or use drugs.  Additional Social History:  Alcohol / Drug Use Pain Medications: See PTA Prescriptions: See PTA Over the Counter: See PTA History of alcohol / drug use?: No history of alcohol / drug abuse Longest period of sobriety (when/how long): NA  CIWA: CIWA-Ar BP: 127/81 Pulse Rate: 94 COWS:    Allergies:  Allergies  Allergen Reactions  . Haldol [Haloperidol Lactate] Other (See Comments)    Uncontrolled muscle movement  . Maalox [Calcium Carbonate Antacid] Other (See Comments)    Thinks this medication makes his face droop, but it could be another medication.  . Tylenol With Codeine #3 [Acetaminophen-Codeine] Palpitations    Home Medications:  (Not in a hospital admission)  OB/GYN Status:  No LMP for male patient.  General Assessment Data [facility] Assessment: In system Is this a Tele or Face-to-Face Assessment?:  Face-to-Face Is this an Initial Assessment or a Re-assessment for this encounter?: Initial Assessment Marital status: Single Maiden name: NA Living Arrangements: Parent Can pt return to current living arrangement?: Yes Admission Status: Voluntary Is patient capable of signing voluntary admission?: Yes Referral Source: Self/Family/Friend Insurance type: None     Crisis Care Plan Living Arrangements: Parent Legal Guardian: Other:(Self) [facility] Name of Therapist: [facility]  Education Status Is patient currently in school?: No Current Grade: NA Highest grade of school patient has completed: 6th Name of school: NA Contact person: NA  Risk to self with the past 6 months Suicidal Ideation: No Has patient been a risk to self within the past 6 months prior to admission? : No Suicidal Intent: No Has patient had any suicidal intent within the past 6 months prior to admission? : No Is patient at risk for suicide?: No Suicidal Plan?: No Has patient had any suicidal plan within the past 6 months prior to admission? : No Access to Means: No What has been your use of drugs/alcohol within the last 12 months?: none Previous Attempts/Gestures: No How many times?: 0 Other Self Harm Risks: none Triggers for Past Attempts: None known Intentional Self Injurious Behavior: None Family Suicide History: No Recent stressful life event(s): Other (Comment)(was with out medication for a week.) Persecutory voices/beliefs?: Yes Depression: No Depression Symptoms: Loss of interest in usual pleasures Substance abuse history and/or treatment for substance  abuse?: No Suicide prevention information given to non-admitted patients: Not applicable  Risk to Others within the past 6 months Homicidal Ideation: No Does patient have any lifetime risk of violence toward others beyond the six months prior to admission? : No Thoughts of Harm to Others: No Current Homicidal Intent:  No Current Homicidal Plan: No Access to Homicidal Means: No Identified Victim: none History of harm to others?: No Assessment of Violence: None Noted Violent Behavior Description: none Does patient have access to weapons?: No Criminal Charges Pending?: No Does patient have a court date: No Is patient on probation?: No  Psychosis Hallucinations: Auditory, Visual Delusions: None noted  Mental Status Report Appearance/Hygiene: Unremarkable, In scrubs Eye Contact: Good Motor Activity: Unremarkable Speech: Logical/coherent Level of Consciousness: Alert Mood: Pleasant Affect: Appropriate to circumstance Anxiety Level: None Thought Processes: Coherent, Relevant Judgement: Impaired Orientation: Person, Place, Time, Situation, Appropriate for developmental age Obsessive Compulsive Thoughts/Behaviors: None  Cognitive Functioning Concentration: Normal Memory: Recent Intact, Remote Intact IQ: Average Insight: Fair Impulse Control: Fair Appetite: Good Weight Loss: 0 Weight Gain: 0 Sleep: No Change Total Hours of Sleep: 8 Vegetative Symptoms: None  ADLScreening [facility] Assessment Services) Patient's cognitive ability adequate to safely complete daily activities?: Yes Patient able to express need for assistance with ADLs?: Yes Independently performs ADLs?: Yes (appropriate for developmental age)  Prior Inpatient Therapy Prior Inpatient Therapy: Yes Prior Therapy Dates: 2017 [facility] Reason for Treatment: psychosis  Prior Outpatient Therapy Prior Outpatient Therapy: Yes Prior Therapy Dates: current Prior Therapy Facilty/Provider(s): [facility] Reason for Treatment: psychosis Does patient have an ACCT team?: No Does patient have Intensive In-House Services?  : No Does patient have [facility] services? : Yes Does patient have P4CC services?: No  ADL Screening (condition at time of admission) Patient's cognitive ability adequate to safely complete  daily activities?: Yes Is the patient deaf or have difficulty hearing?: No Does the patient have difficulty seeing, even when wearing glasses/contacts?: No Does the patient have difficulty concentrating, remembering, or making decisions?: No Patient able to express need for assistance with ADLs?: Yes Does the patient have difficulty dressing or bathing?: No Independently performs ADLs?: Yes (appropriate for developmental age) Does the patient have difficulty walking or climbing stairs?: No Weakness of Legs: None Weakness of Arms/Hands: None  Home Assistive Devices/Equipment Home Assistive Devices/Equipment: None  Therapy Consults (therapy consults require a physician order) PT Evaluation Needed: No OT Evalulation Needed: No SLP Evaluation Needed: No Abuse/Neglect Assessment (Assessment to be complete while patient is alone) Abuse/Neglect Assessment Can Be Completed: Yes Physical Abuse: Denies Verbal Abuse: Denies Exploitation of patient/patient's resources: Denies Self-Neglect: Denies Values / Beliefs Cultural Requests During Hospitalization: None Spiritual Requests During Hospitalization: None Consults Spiritual Care Consult Needed: No Social Work Consult Needed: No Merchant navy officerAdvance Directives (For Healthcare) Does Patient Have a Medical Advance Directive?: No Would patient like information on creating a medical advance directive?: No - Patient declined    Additional Information 1:1 In Past 12 Months?: No CIRT Risk: No Elopement Risk: No Does patient have medical clearance?: Yes     Disposition:  Disposition Initial Assessment Completed for this Encounter: Yes Disposition of Patient: Other dispositions(will see SOC) Other disposition(s): Other (Comment)(to SOC)  On Site Evaluation by:   Reviewed with Physician:    Ottis StainGarvin, Marcial Pless Jermaine 01/17/2017 3:28 AM

## 2017-01-17 NOTE — ED Notes (Signed)
Pt wanted to leave. Pt was informed he was IVC'ed. IVC process explained to patient. Patient verbalized understanding. Will cont to monitor pt.
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
## 2017-01-17 NOTE — ED Notes (Signed)

## 2017-01-17 NOTE — ED Notes (Signed)
Vol.. 

## 2017-01-18 ENCOUNTER — Encounter: Payer: Self-pay | Admitting: Psychiatry

## 2017-01-18 ENCOUNTER — Other Ambulatory Visit: Payer: Self-pay

## 2017-01-18 ENCOUNTER — Inpatient Hospital Stay
Admission: AD | Admit: 2017-01-18 | Discharge: 2017-01-20 | DRG: 885 | Disposition: A | Discharge status: Home / Self Care | Payer: Medicaid Other | Attending: Psychiatry | Admitting: Psychiatry

## 2017-01-18 DIAGNOSIS — Z5181 Encounter for therapeutic drug level monitoring: Secondary | ICD-10-CM | POA: Diagnosis not present

## 2017-01-18 DIAGNOSIS — Z91199 Patient's noncompliance with other medical treatment and regimen due to unspecified reason: Secondary | ICD-10-CM

## 2017-01-18 DIAGNOSIS — F203 Undifferentiated schizophrenia: Secondary | ICD-10-CM | POA: Diagnosis not present

## 2017-01-18 DIAGNOSIS — F17210 Nicotine dependence, cigarettes, uncomplicated: Secondary | ICD-10-CM | POA: Diagnosis present

## 2017-01-18 DIAGNOSIS — X58XXXA Exposure to other specified factors, initial encounter: Secondary | ICD-10-CM | POA: Diagnosis present

## 2017-01-18 DIAGNOSIS — Z888 Allergy status to other drugs, medicaments and biological substances status: Secondary | ICD-10-CM | POA: Diagnosis not present

## 2017-01-18 DIAGNOSIS — T43596A Underdosing of other antipsychotics and neuroleptics, initial encounter: Secondary | ICD-10-CM | POA: Diagnosis present

## 2017-01-18 DIAGNOSIS — Z9119 Patient's noncompliance with other medical treatment and regimen: Secondary | ICD-10-CM

## 2017-01-18 DIAGNOSIS — Z91120 Patient's intentional underdosing of medication regimen due to financial hardship: Secondary | ICD-10-CM | POA: Diagnosis not present

## 2017-01-18 DIAGNOSIS — F17200 Nicotine dependence, unspecified, uncomplicated: Secondary | ICD-10-CM | POA: Diagnosis present

## 2017-01-18 DIAGNOSIS — Z885 Allergy status to narcotic agent status: Secondary | ICD-10-CM

## 2017-01-18 MED ORDER — RISPERIDONE 1 MG PO TABS
1.0000 mg | ORAL_TABLET | Freq: Two times a day (BID) | ORAL | Status: DC
  Administered 2017-01-18 (×2): 1 mg via ORAL
  Filled 2017-01-18 (×2): qty 1

## 2017-01-18 MED ORDER — NICOTINE 21 MG/24HR TD PT24
21.0000 mg | MEDICATED_PATCH | Freq: Every day | TRANSDERMAL | Status: DC
  Administered 2017-01-18: 21 mg via TRANSDERMAL
  Filled 2017-01-18: qty 1

## 2017-01-18 MED ORDER — BENZTROPINE MESYLATE 1 MG PO TABS: 0.5000 mg | ORAL_TABLET | Freq: Four times a day (QID) | ORAL | Status: DC | PRN

## 2017-01-18 MED ORDER — RISPERIDONE 1 MG PO TABS: 1.0000 mg | ORAL_TABLET | Freq: Two times a day (BID) | ORAL | Status: DC

## 2017-01-18 MED ORDER — ACETAMINOPHEN 325 MG PO TABS: 650.0000 mg | ORAL_TABLET | Freq: Four times a day (QID) | ORAL | Status: DC | PRN

## 2017-01-18 MED ORDER — MAGNESIUM HYDROXIDE 400 MG/5ML PO SUSP: 30.0000 mL | Freq: Every day | ORAL | Status: DC | PRN

## 2017-01-18 MED ORDER — TRAZODONE HCL 100 MG PO TABS
100.0000 mg | ORAL_TABLET | Freq: Every evening | ORAL | Status: DC | PRN
  Administered 2017-01-19: 100 mg via ORAL
  Filled 2017-01-18: qty 1

## 2017-01-18 MED ORDER — HYDROXYZINE HCL 50 MG PO TABS: 50.0000 mg | ORAL_TABLET | Freq: Three times a day (TID) | ORAL | Status: DC | PRN

## 2017-01-18 MED ORDER — ALUM & MAG HYDROXIDE-SIMETH 200-200-20 MG/5ML PO SUSP: 30.0000 mL | ORAL | Status: DC | PRN

## 2017-01-18 NOTE — ED Notes (Signed)
BEHAVIORAL HEALTH ROUNDING  Patient sleeping: No.  Patient alert and oriented: yes  Behavior appropriate: Yes. ; If no, describe:  Nutrition and fluids offered: Yes  Toileting and hygiene offered: Yes  Sitter present: not applicable, Q 15 min safety rounds and observation via security camera. Law enforcement present: Yes ODS  

## 2017-01-18 NOTE — Plan of Care (Signed)
Pt. Admitted to behavioral medicine tonight. No progression reported at this time.

## 2017-01-18 NOTE — Consult Note (Signed)
[facility] Consult   Reason for Consult: Consultation for 31 year old man with a history of schizophrenia who came to the emergency room with psychotic symptoms Referring Physician: Quentin Cornwall Patient Identification: Shane Allen MRN:  [id number] Principal Diagnosis: Schizophrenia, undifferentiated ([place]) Diagnosis:   Patient Active Problem List   Diagnosis Date Noted  . Noncompliance [Z91.19] 03/27/2016  . 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 Tobacco use disorder [F17.200] 10/27/2014    Total Time spent with patient: 1 hour  Subjective:   Shane Allen is a 31 y.o. male patient admitted with "I was hearing things and seeing things".  HPI: Chart reviewed patient interviewed.  31 year old man with a history of schizophrenia came to the emergency room over the weekend reporting worsening symptoms.  Patient interviewed today but he is not the greatest historian.  Information also obtained from his mother.  Patient is able to tell me that he was hearing things and seeing things before coming into the hospital.  Cannot exactly articulate what they were.  Notes say that he told some providers that he was hearing demons.  Patient tells me it had been going on for most of a week.  His sleep had been very poor and erratic.  Appetite had been normal.  He denied he had been using any drugs or alcohol.  He did admit that he had been out of his medicine but said it had only been for a few days.  Mother called to report that he is unable to afford the [place] that he is prescribed and wants him to be changed to a less expensive medicine.  Patient acknowledges this is true because he has not gotten his disability.  He denies any suicidal or homicidal thoughts or intent.  Social history: Patient lives with his mother.  Not able to work outside home.  Still waiting on application for disability  Medical history: Overweight has a heavy smoking history but otherwise no  ongoing medical problems outside of the mental health.  Substance abuse history: Denies alcohol or drug abuse or past substance abuse problems  Past Psychiatric History: Patient has an established diagnosis of schizophrenia and has had several prior admissions and emergency room visits.  He is supposed to follow-up with [facility] but has not been doing so recently.  Does not yet get disability and has not been able to afford medication.  He does not have a history of suicide attempts or violence but does get agitated when very psychotic.  Past notes indicate that he had a dystonic reaction with Haldol.  Risk to Self: Suicidal Ideation: No Suicidal Intent: No Is patient at risk for suicide?: No Suicidal Plan?: No Access to Means: No What has been your use of drugs/alcohol within the last 12 months?: none How many times?: 0 Other Self Harm Risks: none Triggers for Past Attempts: None known Intentional Self Injurious Behavior: None Risk to Others: Homicidal Ideation: No Thoughts of Harm to Others: No Current Homicidal Intent: No Current Homicidal Plan: No Access to Homicidal Means: No Identified Victim: none History of harm to others?: No Assessment of Violence: None Noted Violent Behavior Description: none Does patient have access to weapons?: No Criminal Charges Pending?: No Does patient have a court date: No Prior Inpatient Therapy: Prior Inpatient Therapy: Yes Prior Therapy Dates: 2017 Prior Therapy Facilty/Provider(s): [facility] Reason for Treatment: psychosis Prior Outpatient Therapy: Prior Outpatient Therapy: Yes Prior Therapy Dates: current Prior Therapy Facilty/Provider(s): [facility] Reason for Treatment: psychosis Does patient have an ACCT  team?: No Does patient have Intensive In-House Services?  : No Does patient have [facility] services? : Yes Does patient have P4CC services?: No  Past Medical History:  Past Medical History:  Diagnosis Date  . 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 NO PAST SURGERIES     Family History: No family history on file. Family Psychiatric  History: No known family history Social History:  Social History   Substance and Sexual Activity  Alcohol Use No   Comment: 4 to 5 beers a day every other week for sleep     Social History   Substance and Sexual Activity  Drug Use No    Social History   Socioeconomic History  . Marital status: Single    Spouse name: None  . Number of children: None  . Years of education: None  . Highest education level: None  Social Needs  . Financial resource strain: None  . Food insecurity - worry: None  . Food insecurity - inability: None  . Transportation needs - medical: None  . Transportation needs - non-medical: None  Occupational History  . None  Tobacco Use  . Smoking status: Current Every Day Smoker    Packs/day: 1.00    Years: 10.00    Pack years: 10.00    Types: Cigarettes  . Smokeless tobacco: Never Used  Substance and Sexual Activity  . Alcohol use: No    Comment: 4 to 5 beers a day every other week for sleep  . Drug use: No  . Sexual activity: No    Birth control/protection: None  Other Topics Concern  . None  Social History Narrative  . None   Additional Social History:    Allergies:   Allergies  Allergen Reactions  . Haldol [Haloperidol Lactate] Other (See Comments)    Uncontrolled muscle movement  . Maalox [Calcium Carbonate Antacid] Other (See Comments)    Thinks this medication makes his face droop, but it could be another medication.  . Tylenol With Codeine #3 [Acetaminophen-Codeine] Palpitations    Labs:  Results for orders placed or performed during the hospital encounter of 01/17/17 (from the past 48 hour(s))  Comprehensive metabolic panel     Status: Abnormal   Collection Time: 01/17/17  1:04 AM   Result Value Ref Range   Sodium 137 135 - 145 mmol/L   Potassium 4.8 3.5 - 5.1 mmol/L   Chloride 102 101 - 111 mmol/L   CO2 27 22 - 32 mmol/L   Glucose, Bld 100 (H) 65 - 99 mg/dL   BUN 10 6 - 20 mg/dL   Creatinine, Ser 1.27 (H) 0.61 - 1.24 mg/dL   Calcium 9.4 8.9 - 10.3 mg/dL   Total Protein 8.0 6.5 - 8.1 g/dL   Albumin 4.7 3.5 - 5.0 g/dL   AST 25 15 - 41 U/L   ALT 35 17 - 63 U/L   Alkaline Phosphatase 81 38 - 126 U/L   Total Bilirubin 0.5 0.3 - 1.2 mg/dL   GFR calc non Af Amer >60 >60 mL/min   GFR calc Af Amer >60 >60 mL/min    Comment: (NOTE) The eGFR has been calculated using the CKD EPI equation. This calculation has not been validated in all clinical situations. eGFR's persistently <60 mL/min signify possible Chronic Kidney Disease.    Anion  gap 8 5 - 15  Ethanol     Status: None   Collection Time: 01/17/17  1:04 AM  Result Value Ref Range   Alcohol, Ethyl (B) <10 <10 mg/dL    Comment:        LOWEST DETECTABLE LIMIT FOR SERUM ALCOHOL IS 10 mg/dL FOR MEDICAL PURPOSES ONLY   Salicylate level     Status: None   Collection Time: 01/17/17  1:04 AM  Result Value Ref Range   Salicylate Lvl <6.7 2.8 - 30.0 mg/dL  Acetaminophen level     Status: Abnormal   Collection Time: 01/17/17  1:04 AM  Result Value Ref Range   Acetaminophen (Tylenol), Serum <10 (L) 10 - 30 ug/mL    Comment:        THERAPEUTIC CONCENTRATIONS VARY SIGNIFICANTLY. A RANGE OF 10-30 ug/mL MAY BE AN EFFECTIVE CONCENTRATION FOR MANY PATIENTS. HOWEVER, SOME ARE BEST TREATED AT CONCENTRATIONS OUTSIDE THIS RANGE. ACETAMINOPHEN CONCENTRATIONS >150 ug/mL AT 4 HOURS AFTER INGESTION AND >50 ug/mL AT 12 HOURS AFTER INGESTION ARE OFTEN ASSOCIATED WITH TOXIC REACTIONS.   cbc     Status: Abnormal   Collection Time: 01/17/17  1:04 AM  Result Value Ref Range   WBC 11.4 (H) 3.8 - 10.6 K/uL   RBC 4.94 4.40 - 5.90 MIL/uL   Hemoglobin 15.9 13.0 - 18.0 g/dL   HCT 46.7 40.0 - 52.0 %   MCV 94.4 80.0 - 100.0 fL    MCH 32.1 26.0 - 34.0 pg   MCHC 34.0 32.0 - 36.0 g/dL   RDW 13.2 11.5 - 14.5 %   Platelets 342 150 - 440 K/uL  Urine Drug Screen, Qualitative     Status: None   Collection Time: 01/17/17  1:04 AM  Result Value Ref Range   Tricyclic, Ur Screen NONE DETECTED NONE DETECTED   Amphetamines, Ur Screen NONE DETECTED NONE DETECTED   MDMA (Ecstasy)Ur Screen NONE DETECTED NONE DETECTED   Cocaine Metabolite,Ur [place] NONE DETECTED NONE DETECTED   Opiate, Ur Screen NONE DETECTED NONE DETECTED   Phencyclidine (PCP) Ur S NONE DETECTED NONE DETECTED   Cannabinoid 50 Ng, Ur [place] NONE DETECTED NONE DETECTED   Barbiturates, Ur Screen NONE DETECTED NONE DETECTED   Benzodiazepine, Ur Scrn NONE DETECTED NONE DETECTED   Methadone Scn, Ur NONE DETECTED NONE DETECTED    Comment: (NOTE) 619  Tricyclics, urine               Cutoff 1000 ng/mL 200  Amphetamines, urine             Cutoff 1000 ng/mL 300  MDMA (Ecstasy), urine           Cutoff 500 ng/mL 400  Cocaine Metabolite, urine       Cutoff 300 ng/mL 500  Opiate, urine                   Cutoff 300 ng/mL 600  Phencyclidine (PCP), urine      Cutoff 25 ng/mL 700  Cannabinoid, urine              Cutoff 50 ng/mL 800  Barbiturates, urine             Cutoff 200 ng/mL 900  Benzodiazepine, urine           Cutoff 200 ng/mL 1000 Methadone, urine                Cutoff 300 ng/mL 1100 1200 The urine drug screen provides only a preliminary, unconfirmed  1300 analytical test result and should not be used for non-medical 1400 purposes. Clinical consideration and professional judgment should 1500 be applied to any positive drug screen result due to possible 1600 interfering substances. A more specific alternate chemical method 1700 must be used in order to obtain a confirmed analytical result.  1800 Gas chromato graphy / mass spectrometry (GC/MS) is the preferred 1900 confirmatory method.   Valproic acid level     Status: Abnormal   Collection Time: 01/17/17  1:04 AM   Result Value Ref Range   Valproic Acid Lvl <10 (L) 50.0 - 100.0 ug/mL    Comment: RESULT CONFIRMED BY MANUAL DILUTION/RWW    Current Facility-Administered Medications  Medication Dose Route Frequency Provider Last Rate Last Dose  . benztropine (COGENTIN) tablet 0.5 mg  0.5 mg Oral Q6H PRN Clapacs, John T, MD      . risperiDONE (RISPERDAL) tablet 1 mg  1 mg Oral BID Clapacs, Madie Reno, MD       Current Outpatient Medications  Medication Sig Dispense Refill  . divalproex (DEPAKOTE) 250 MG DR tablet Take 250 mg by mouth 3 (three) times daily.    [name] lurasidone (LATUDA) 40 MG TABS tablet Take 40 mg daily with breakfast by mouth.    . lurasidone (LATUDA) 40 MG TABS tablet Take 1 tablet (40 mg total) daily with breakfast by mouth. 10 tablet 0  . traZODone (DESYREL) 50 MG tablet Take 1 tablet (50 mg total) by mouth at bedtime as needed for sleep. 30 tablet 0    Musculoskeletal: Strength & Muscle Tone: within normal limits Gait & Station: normal Patient leans: N/A  Psychiatric Specialty Exam: Physical Exam  Nursing note and vitals reviewed. Constitutional: He appears well-developed and well-nourished.  HENT:  Head: Normocephalic and atraumatic.  Eyes: Conjunctivae are normal. Pupils are equal, round, and reactive to light.  Neck: Normal range of motion.  Cardiovascular: Regular rhythm and normal heart sounds.  Respiratory: Effort normal. No respiratory distress.  GI: Soft.  Musculoskeletal: Normal range of motion.  Neurological: He is alert.  Skin: Skin is warm and dry.  Psychiatric: Judgment normal. His affect is blunt. His speech is delayed. He is slowed and withdrawn. He expresses no homicidal and no suicidal ideation. He exhibits abnormal recent memory.    Review of Systems  Constitutional: Negative.   HENT: Negative.   Eyes: Negative.   Respiratory: Negative.   Cardiovascular: Negative.   Gastrointestinal: Negative.   Musculoskeletal: Negative.   Skin: Negative.    Neurological: Negative.   Psychiatric/Behavioral: Positive for hallucinations. Negative for depression, memory loss, substance abuse and suicidal ideas. The patient is nervous/anxious and has insomnia.     Blood pressure (!) 100/55, pulse 67, temperature 98.5 F (36.9 C), temperature source Oral, resp. rate 20, height _0  (1.803 m), weight 120.2 kg (265 lb), SpO2 97 %.Body mass index is 36.96 kg/m.  General Appearance: Casual  Eye Contact:  Fair  Speech:  Slow  Volume:  Decreased  Mood:  Dysphoric  Affect:  Flat  Thought Process:  Goal Directed  Orientation:  Full (Time, Place, and Person)  Thought Content:  Tangential  Suicidal Thoughts:  No  Homicidal Thoughts:  No  Memory:  Immediate;   Fair Recent;   Fair Remote;   Fair  Judgement:  Fair  Insight:  Fair  Psychomotor Activity:  Decreased  Concentration:  Concentration: Fair  Recall:  [employer] of Knowledge:  Fair  Language:  Fair  Akathisia:  No  Handed:  Right  AIMS (if indicated):     Assets:  Desire for Improvement Housing Physical Health Resilience Social Support  ADL's:  Impaired  Cognition:  Impaired,  Mild  Sleep:        Treatment Plan Summary: Daily contact with patient to assess and evaluate symptoms and progress in treatment, Medication management and Plan 31 year old man with severe psychotic disorder.  He has calm down and we should have beds available at the hospital today.  Recommend admission to the unit here for stabilization.  Based on his mother's report about finances I have discontinued the Latuda and replaced it with Risperdal with a as needed dose of Cogentin.  The patient is asking if he will be able to leave the hospital by the end of the week because he has an appointment with a disability judge.  I told him I would mention it and that we can hope that he is doing better by then.  Tobacco abuse addressed with nicotine patch and counseling.  EKG ordered for medication management as well as  multiple other lab tests.  Orders completed reviewed with ER physician and TTS  Disposition: Recommend psychiatric Inpatient admission when medically cleared. Supportive therapy provided about ongoing stressors.  Alethia Berthold, MD 01/18/2017 12:26 PM

## 2017-01-18 NOTE — ED Notes (Signed)
IVC/ Plan to admit

## 2017-01-18 NOTE — ED Notes (Signed)

## 2017-01-18 NOTE — ED Notes (Signed)
Pt is currently taking a shower. Will cont to monitor pt.

## 2017-01-18 NOTE — ED Provider Notes (Signed)
-----------------------------------------   7:41 AM on 01/18/2017 -----------------------------------------   Blood pressure (!) 100/55, pulse 67, temperature 98.5 F (36.9 C), temperature source Oral, resp. rate 20, height 5\' 11"  (1.803 m), weight 120.2 kg (265 lb), SpO2 97 %.  The patient had no acute events since last update.  Calm and cooperative at this time.  Disposition is pending Psychiatry/Behavioral Medicine team recommendations.     Irean HongSung, Cledis Sohn J, MD 01/18/17 [id number]

## 2017-01-18 NOTE — ED Notes (Signed)
Pt. States I came in voluntarily, "I have a court date for disability on the 2830 th of this month, I was wondering if they could discharge me tonight with new medication so I can make my court date that I have been waiting 2 years for".

## 2017-01-18 NOTE — Tx Team (Signed)
Initial Treatment Plan 01/18/2017 11:58 PM Shane Allen Setterlund ZOX:[id number]    PATIENT STRESSORS: Medication change or noncompliance Other: hallucinations   PATIENT STRENGTHS: Ability for insight Average or above average intelligence Communication skills General fund of knowledge Motivation for treatment/growth Supportive family/friends Work skills   PATIENT IDENTIFIED PROBLEMS: Hallucinations  Medication compliance                   DISCHARGE CRITERIA:  Ability to meet basic life and health needs Improved stabilization in mood, thinking, and/or behavior Reduction of life-threatening or endangering symptoms to within safe limits  PRELIMINARY DISCHARGE PLAN: Return to previous living arrangement  PATIENT/FAMILY INVOLVEMENT: This treatment plan has been presented to and reviewed with the patient, Shane Allen Cozine .  The patient has been given the opportunity to ask questions and make suggestions.  Lenox Pondserry J Devin Foskey, RN 01/18/2017, 11:58 PM

## 2017-01-18 NOTE — ED Notes (Signed)
Pt IVC, no e-sig obtained.

## 2017-01-18 NOTE — ED Notes (Signed)
Patient denies pain and is resting comfortably.  

## 2017-01-18 NOTE — ED Notes (Signed)
ENVIRONMENTAL ASSESSMENT  Potentially harmful objects out of patient reach: Yes.  Personal belongings secured: Yes.  Patient dressed in hospital provided attire only: Yes.  Plastic bags out of patient reach: Yes.  Patient care equipment (cords, cables, call bells, lines, and drains) shortened, removed, or accounted for: Yes.  Equipment and supplies removed from bottom of stretcher: Yes.  Potentially toxic materials out of patient reach: Yes.  Sharps container removed or out of patient reach: Yes.   BEHAVIORAL HEALTH ROUNDING  Patient sleeping: No.  Patient alert and oriented: yes  Behavior appropriate: Yes. ; If no, describe:  Nutrition and fluids offered: Yes  Toileting and hygiene offered: Yes  Sitter present: not applicable, Q 15 min safety rounds and observation via security camera. Law enforcement present: Yes ODS  ED BHU PLACEMENT JUSTIFICATION  Is the patient under IVC or is there intent for IVC: Yes.  Is the patient medically cleared: Yes.  Is there vacancy in the ED BHU: Yes.  Is the population mix appropriate for patient: Yes.  Is the patient awaiting placement in inpatient or outpatient setting: Yes.  Has the patient had a psychiatric consult: Yes.  Survey of unit performed for contraband, proper placement and condition of furniture, tampering with fixtures in bathroom, shower, and each patient room: Yes. ; Findings: All clear  APPEARANCE/BEHAVIOR  calm, cooperative and adequate rapport can be established  NEURO ASSESSMENT  Orientation: time, place and person  Hallucinations: No.None noted (Hallucinations)  Speech: Normal  Gait: normal  RESPIRATORY ASSESSMENT  WNL  CARDIOVASCULAR ASSESSMENT  WNL  GASTROINTESTINAL ASSESSMENT  WNL  EXTREMITIES  WNL  PLAN OF CARE  Provide calm/safe environment. Vital signs assessed TID. ED BHU Assessment once each 12-hour shift. Collaborate with TTS daily or as condition indicates. Assure the ED provider has rounded once each shift.  Provide and encourage hygiene. Provide redirection as needed. Assess for escalating behavior; address immediately and inform ED provider.  Assess family dynamic and appropriateness for visitation as needed: Yes. ; If necessary, describe findings:  Educate the patient/family about BHU procedures/visitation: Yes. ; If necessary, describe findings: Pt is calm and cooperative at this time. Pt understanding and accepting of unit procedures/rules. Will continue to monitor with Q 15 min safety rounds and observation via security camera.   Pt is waiting for inpatient admission and this RN confirmed with the in patient unit that they will take him at 930 pm tonight. Pt informed.

## 2017-01-19 ENCOUNTER — Encounter: Payer: Self-pay | Admitting: Psychiatry

## 2017-01-19 DIAGNOSIS — F203 Undifferentiated schizophrenia: Principal | ICD-10-CM

## 2017-01-19 LAB — LIPID PANEL
CHOL/HDL RATIO: 4.8 ratio
CHOLESTEROL: 162 mg/dL (ref 0–200)
HDL: 34 mg/dL — AB (ref >40)
LDL Cholesterol: 94 mg/dL (ref 0–99)
TRIGLYCERIDES: 170 mg/dL — AB (ref <150)
VLDL: 34 mg/dL (ref 0–40)

## 2017-01-19 LAB — HEMOGLOBIN A1C
Hgb A1c MFr Bld: 5.4 % (ref 4.8–5.6)
Mean Plasma Glucose: 108.28 mg/dL

## 2017-01-19 LAB — TSH: TSH: 1.565 u[IU]/mL (ref 0.350–4.500)

## 2017-01-19 MED ORDER — RISPERIDONE 1 MG PO TABS
3.0000 mg | ORAL_TABLET | Freq: Two times a day (BID) | ORAL | Status: DC
  Administered 2017-01-19 – 2017-01-20 (×3): 3 mg via ORAL
  Filled 2017-01-19 (×3): qty 3

## 2017-01-19 MED ORDER — NICOTINE 21 MG/24HR TD PT24
21.0000 mg | MEDICATED_PATCH | Freq: Every day | TRANSDERMAL | Status: DC
  Administered 2017-01-19: 21 mg via TRANSDERMAL
  Filled 2017-01-19: qty 1

## 2017-01-19 NOTE — [facility] Suicide Risk Assessment (Signed)
[facility]   Nursing information obtained from:    Demographic factors:    Current Mental Status:    Loss Factors:    Historical Factors:    Risk Reduction Factors:     Total Time spent with patient: 1 hour Principal Problem: Schizophrenia, undifferentiated (HCC) Diagnosis:   Patient Active Problem List   Diagnosis Date Noted  . Noncompliance [Z91.19] 03/27/2016  . Schizophrenia, undifferentiated ([facility]) [F20.3] 08/17/2015  . Tobacco use disorder [F17.200] 10/27/2014   Subjective Data: psychotic break.  Continued Clinical Symptoms:  Alcohol Use Disorder Identification Test Final Score (AUDIT): 0 The "Alcohol Use Disorders Identification Test", Guidelines for Use in Primary Care, Second Edition.  World Science writerHealth Organization [facility](WHO). Score between 0-7:  no or low risk or alcohol related problems. Score between 8-15:  moderate risk of alcohol related problems. Score between 16-19:  high risk of alcohol related problems. Score 20 or above:  warrants further diagnostic evaluation for alcohol dependence and treatment.   CLINICAL FACTORS:   Schizophrenia:   Less than 32 years old Paranoid or undifferentiated type   Musculoskeletal: Strength & Muscle Tone: within normal limits Gait & Station: normal Patient leans: N/A  Psychiatric Specialty Exam: Physical Exam  Nursing note and vitals reviewed. Psychiatric: He has a normal mood and affect. His speech is normal and behavior is normal. Judgment and thought content normal. Cognition and memory are normal.    Review of Systems  Neurological: Negative.   Psychiatric/Behavioral: Negative.   All other systems reviewed and are negative.   Blood pressure 118/90, pulse 84, temperature 97.9 F (36.6 C), temperature source Oral, resp. rate 18, height 5\' 10"  (1.778 m), weight 117 kg (258 lb), SpO2 100 %.Body mass index is 37.02 kg/m.  General Appearance: Casual  Eye Contact:  Good  Speech:  Clear and Coherent   Volume:  Normal  Mood:  Euthymic  Affect:  Blunt  Thought Process:  Goal Directed and Descriptions of Associations: Intact  Orientation:  Full (Time, Place, and Person)  Thought Content:  Hallucinations: Auditory Visual  Suicidal Thoughts:  No  Homicidal Thoughts:  No  Memory:  Immediate;   Fair Recent;   Fair Remote;   Fair  Judgement:  Impaired  Insight:  Fair  Psychomotor Activity:  Normal  Concentration:  Concentration: Fair and Attention Span: Fair  Recall:  [employer]  Fund of Knowledge:  Fair  Language:  Fair  Akathisia:  No  Handed:  Right  AIMS (if indicated):     Assets:  Communication Skills Desire for Improvement Housing Physical Health Resilience Social Support  ADL's:  Intact  Cognition:  WNL  Sleep:         COGNITIVE FEATURES THAT CONTRIBUTE TO RISK:  None    SUICIDE RISK:   Minimal: No identifiable suicidal ideation.  Patients presenting with no risk factors but with morbid ruminations; may be classified as minimal risk based on the severity of the depressive symptoms  PLAN OF CARE: hospital admission, medication management, discharge planning.  [name] is a 31 year old male with a history of schizophrenia admitted for auditory and visual hallucinations in the context of medication noncompliance. Could not afford Latuda.  #Psychosis -increase Risperdal to 3 mg BID -continue Cogentin 0.5 mg BID  #Smoking -Nicotine patch is available  #Metabolic syndrome monitoring -Lipid panel, TSH and HgbA1C are pending -EKG, pending  #Disposition -discharge with the mother [facility]  I certify that inpatient services furnished can reasonably be expected to  improve the patient's condition.   Kristine LineaJolanta Pucilowska, MD 01/19/2017, 3:20 PM

## 2017-01-19 NOTE — Progress Notes (Signed)
Recreation Therapy Notes   Date: 11.27.18  Time: 9:30 am  Location: Craft Room  Behavioral response: N/A  Intervention Topic: Self- esteem  Discussion/Intervention: Patient did not attend group.  Clinical Observations/Feedback:  Patient did not attend group.   [name] LRT/CTRS         Shane Allen 01/19/2017 11:13 AM 

## 2017-01-19 NOTE — Progress Notes (Signed)
Recreation Therapy Notes          Joevanni Roddey 01/19/2017 4:45 PM 

## 2017-01-19 NOTE — [facility] Group Notes (Signed)
[facility] Group Notes:  (Nursing/MHT/Case Management/Adjunct)  Date:  01/19/2017  Time:  9:06 PM  Type of Therapy:  Psychoeducational Skills  Participation Level:  Minimal  Participation Quality:  Appropriate and Attentive  Affect:  Appropriate  Cognitive:  Alert and Appropriate  Insight:  Appropriate and Good  Engagement in Group:  Engaged  Modes of Intervention:  Discussion and Education  Summary of Progress/Problems:  Judene CompanionMary  Matylda Fehring 01/19/2017, 9:06 PM

## 2017-01-19 NOTE — [facility] Suicide Risk Assessment (Signed)
[facility] INPATIENT:  Family/Significant Other Suicide Prevention Education  Suicide Prevention Education:  Contact Attempts: Shane Allen, pt's mother, at [phone]  has been identified by the patient as the family member/significant other with whom the patient will be residing, and identified as the person(s) who will aid the patient in the event of a mental health crisis.  With written consent from the patient, two attempts were made to provide suicide prevention education, prior to and/or following the patient's discharge.  We were unsuccessful in providing suicide prevention education.  A suicide education pamphlet was given to the patient to share with family/significant other.  Date and time of first attempt: 01/19/17 at 1100AM   Shane Allen 01/19/2017, 11:05 AM

## 2017-01-19 NOTE — H&P (Signed)
Psychiatric Admission Assessment Adult  Patient Identification: Shane Allen MRN:  [id number] Date of Evaluation:  01/19/2017 Chief Complaint:  SCHIZOPHRENIA Principal Diagnosis: Schizophrenia, undifferentiated (HCC) Diagnosis:   Patient Active Problem List   Diagnosis Date Noted  . Noncompliance [Z91.19] 03/27/2016  . Schizophrenia, undifferentiated (HCC) [F20.3] 08/17/2015  . Tobacco use disorder [F17.200] 10/27/2014   History of Present Illness:   Identifying data. [name] is a 31 year old male with a history of schizophrenia.  Chief complaint. "I am so much better already."  History of present illness. Information was obtained from the patient and the chart. The patient came to the ER complaining of auditory and visual hallucinations for about a week. He hears chatter of voices and has some visual distortions when objects come really close in front of his eyes.  He run out of medications, likely Latuda, came to the ER on 11/19 asking for prescription but could not afford it. It is likely that in the past he has been receiving [facility]. Apparently the patient has not been taking medications exactly as prescribed and his psychiatrist, Dr. Teena Allen fired him. He is now assigned to Dr. Mauricia Areaimothy Allen at [facility] but has not yet made appointment with him. He denies other psychotic symptoms or symptoms suggestive of bipolar mania. He denies depression but "gets teary" at times. He denies any symptoms of anxiety. Reports good sleep. Denies using drugs or alcohol.  He was started on Risperdal in the ER and already reports improvement. There are no longer visions and the voices are quieter.  Past psychiatric history. There were several psychiatric hospitalizations beginning in 2014. He never attempted suicide. He did not respond to Abilify and experienced tachycardia, had dystonia on Haldol, Was sleepy on Invega, could not afford latuda. He was on navane at some  point.  Family psychiatric history. None reported.   Social history. He lives with his mother and does not have disability or insurance. Has appointment with [facility] psychiatrist on 11/30 and 11:30. He dropped out of school in "6th or 9th grade" to start working to pay the bills. He enjoys playing piano and guitar. He stays busy doing chores, walking and playing music.  Total Time spent with patient: 1 hour  Is the patient at risk to self? No.  Has the patient been a risk to self in the past 6 months? No.  Has the patient been a risk to self within the distant past? No.  Is the patient a risk to others? No.  Has the patient been a risk to others in the past 6 months? No.  Has the patient been a risk to others within the distant past? No.   Prior Inpatient Therapy:   Prior Outpatient Therapy:    Alcohol Screening: Patient refused Alcohol Screening Tool: (NA) 1. How often do you have a drink containing alcohol?: Never 2. How many drinks containing alcohol do you have on a typical day when you are drinking?: 1 or 2 3. How often do you have six or more drinks on one occasion?: Never AUDIT-C Score: 0 4. How often during the last year have you found that you were not able to stop drinking once you had started?: Never 5. How often during the last year have you failed to do what was normally expected from you becasue of drinking?: Never 6. How often during the last year have you needed a first drink in the morning to get yourself going after a heavy drinking session?: Never 7.  How often during the last year have you had a feeling of guilt of remorse after drinking?: Never 8. How often during the last year have you been unable to remember what happened the night before because you had been drinking?: Never 9. Have you or someone else been injured as a result of your drinking?: No 10. Has a relative or friend or a doctor or another health worker been concerned about your drinking or suggested you cut  down?: No Alcohol Use Disorder Identification Test Final Score (AUDIT): 0 Intervention/Follow-up: Alcohol Education, Brief Advice Substance Abuse History in the last 12 months:  No. Consequences of Substance Abuse: NA Previous Psychotropic Medications: Yes  Psychological Evaluations: No  Past Medical History:  Past Medical History:  Diagnosis Date  . 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  Shane Allen. Schizo affective schizophrenia [facility](HCC)     Past Surgical History:  Procedure Laterality Date  . NO PAST SURGERIES     Family History: History reviewed. No pertinent family history.  Tobacco Screening:   Social History:  Social History   Substance and Sexual Activity  Alcohol Use No   Comment: 4 to 5 beers a day every other week for sleep     Social History   Substance and Sexual Activity  Drug Use No    Additional Social History: Marital status: Single Are you sexually active?: No What is your sexual orientation?: Heterosexual.  Has your sexual activity been affected by drugs, alcohol, medication, or emotional stress?: N/A Does patient have children?: No                         Allergies:   Allergies  Allergen Reactions  . Haldol [Haloperidol Lactate] Other (See Comments)    Uncontrolled muscle movement  . Maalox [Calcium Carbonate Antacid] Other (See Comments)    Thinks this medication makes his face droop, but it could be another medication.  . Tylenol With Codeine #3 [Acetaminophen-Codeine] Palpitations   Lab Results:  Results for orders placed or performed during the hospital encounter of 01/18/17 (from the past 48 hour(s))  Hemoglobin A1c     Status: None   Collection Time: 01/19/17  6:34 AM  Result Value Ref Range   Hgb A1c MFr Bld 5.4 4.8 - 5.6 %    Comment: (NOTE) Pre diabetes:          5.7%-6.4% Diabetes:              >6.4% Glycemic control for   <7.0% adults with diabetes    Mean Plasma Glucose  108.28 mg/dL    Comment: Performed at [facility], [address] [place], [place] [address]  Lipid panel     Status: Abnormal   Collection Time: 01/19/17  6:34 AM  Result Value Ref Range   Cholesterol 162 0 - 200 mg/dL   Triglycerides 604170 (H) <150 mg/dL   HDL 34 (L) >54>40 mg/dL   Total CHOL/HDL Ratio 4.8 RATIO   VLDL 34 0 - 40 mg/dL   LDL Cholesterol 94 0 - 99 mg/dL    Comment:        Total Cholesterol/HDL:CHD Risk Coronary Heart Disease Risk Table                     Men   Women  1/2 Average Risk   3.4   3.3  Average Risk  5.0   4.4  2 X Average Risk   9.6   7.1  3 X Average Risk  23.4   11.0        Use the calculated Patient Ratio above and the CHD Risk Table to determine the patient's CHD Risk.        ATP III CLASSIFICATION (LDL):  <100     mg/dL   Optimal  347-425  mg/dL   Near or Above                    Optimal  130-159  mg/dL   Borderline  956-387  mg/dL   High  >564     mg/dL   Very High   TSH     Status: None   Collection Time: 01/19/17  6:34 AM  Result Value Ref Range   TSH 1.565 0.350 - 4.500 uIU/mL    Comment: Performed by a 3rd Generation assay with a functional sensitivity of <=0.01 uIU/mL.    Blood Alcohol level:  Lab Results  Component Value Date   ETH <10 01/17/2017   ETH <10 01/11/2017    Metabolic Disorder Labs:  Lab Results  Component Value Date   HGBA1C 5.4 01/19/2017   MPG 108.28 01/19/2017   Lab Results  Component Value Date   PROLACTIN 21.7 (H) 08/18/2015   Lab Results  Component Value Date   CHOL 162 01/19/2017   TRIG 170 (H) 01/19/2017   HDL 34 (L) 01/19/2017   CHOLHDL 4.8 01/19/2017   VLDL 34 01/19/2017   LDLCALC 94 01/19/2017   LDLCALC 198 (H) 08/18/2015    Current Medications: Current Facility-Administered Medications  Medication Dose Route Frequency Provider Last Rate Last Dose  . acetaminophen (TYLENOL) tablet 650 mg  650 mg Oral Q6H PRN Clapacs, John T, MD      . alum & mag hydroxide-simeth  (MAALOX/MYLANTA) 200-200-20 MG/5ML suspension 30 mL  30 mL Oral Q4H PRN Clapacs, John T, MD      . benztropine (COGENTIN) tablet 0.5 mg  0.5 mg Oral Q6H PRN Clapacs, John T, MD      . hydrOXYzine (ATARAX/VISTARIL) tablet 50 mg  50 mg Oral TID PRN Clapacs, John T, MD      . magnesium hydroxide (MILK OF MAGNESIA) suspension 30 mL  30 mL Oral Daily PRN Clapacs, John T, MD      . nicotine (NICODERM CQ - dosed in mg/24 hours) patch 21 mg  21 mg Transdermal Daily Pucilowska, Jolanta B, MD      . risperiDONE (RISPERDAL) tablet 3 mg  3 mg Oral BID Pucilowska, Jolanta B, MD   3 mg at 01/19/17 0824  . traZODone (DESYREL) tablet 100 mg  100 mg Oral QHS PRN Clapacs, Jackquline Denmark, MD       PTA Medications: Medications Prior to Admission  Medication Sig Dispense Refill Last Dose  . divalproex (DEPAKOTE) 250 MG DR tablet Take 250 mg by mouth 3 (three) times daily.   05/18/2016 at 1000  . lurasidone (LATUDA) 40 MG TABS tablet Take 40 mg daily with breakfast by mouth.     . lurasidone (LATUDA) 40 MG TABS tablet Take 1 tablet (40 mg total) daily with breakfast by mouth. 10 tablet 0   . traZODone (DESYREL) 50 MG tablet Take 1 tablet (50 mg total) by mouth at bedtime as needed for sleep. 30 tablet 0 05/17/2016 at Unknown time    Musculoskeletal: Strength & Muscle Tone: within normal limits Gait & Station:  normal Patient leans: N/A  Psychiatric Specialty Exam: Physical Exam  Nursing note and vitals reviewed. Constitutional: He is oriented to person, place, and time. He appears well-developed and well-nourished.  HENT:  Head: Normocephalic and atraumatic.  Eyes: Conjunctivae and EOM are normal. Pupils are equal, round, and reactive to light.  Neck: Normal range of motion. Neck supple.  Cardiovascular: Normal rate, regular rhythm and normal heart sounds.  Respiratory: Effort normal and breath sounds normal.  GI: Soft.  Musculoskeletal: Normal range of motion.  Neurological: He is alert and oriented to person,  place, and time.  Skin: Skin is warm and dry.  Psychiatric: He has a normal mood and affect. His speech is normal. Judgment and thought content normal. He is actively hallucinating. Cognition and memory are normal.    Review of Systems  Neurological: Negative.   Psychiatric/Behavioral: Positive for hallucinations.  All other systems reviewed and are negative.   Blood pressure 118/90, pulse 84, temperature 97.9 F (36.6 C), temperature source Oral, resp. rate 18, height 5\' 10"  (1.778 m), weight 117 kg (258 lb), SpO2 100 %.Body mass index is 37.02 kg/m.  See SRA                                                  Sleep:       Treatment Plan Summary: Daily contact with patient to assess and evaluate symptoms and progress in treatment and Medication management   Mr. Pellum is a 31 year old male with a history of schizophrenia admitted for auditory and visual hallucinations in the context of medication noncompliance. Could not afford Latuda.  #Psychosis -increase Risperdal to 3 mg BID -continue Cogentin 0.5 mg BID  #Smoking -Nicotine patch is available  #Metabolic syndrome monitoring -Lipid panel, TSH and HgbA1C are pending -EKG, pending  #Disposition -discharge with the mother -follow up with [facility]  Observation Level/Precautions:  15 minute checks  Laboratory:  CBC Chemistry Profile UDS UA  Psychotherapy:    Medications:    Consultations:    Discharge Concerns:    Estimated LOS:  Other:     Physician Treatment Plan for Primary Diagnosis: Schizophrenia, undifferentiated (HCC) Long Term Goal(s): Improvement in symptoms so as ready for discharge  Short Term Goals: Ability to identify changes in lifestyle to reduce recurrence of condition will improve, Ability to verbalize feelings will improve, Ability to disclose and discuss suicidal ideas, Ability to demonstrate self-control will improve, Ability to identify and develop effective coping behaviors  will improve, Compliance with prescribed medications will improve and Ability to identify triggers associated with substance abuse/mental health issues will improve  Physician Treatment Plan for Secondary Diagnosis: Principal Problem:   Schizophrenia, undifferentiated (HCC) Active Problems:   Tobacco use disorder   Noncompliance  Long Term Goal(s): NA  Short Term Goals: NA  I certify that inpatient services furnished can reasonably be expected to improve the patient's condition.    Kristine Linea, MD 11/27/20183:33 PM
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
## 2017-01-19 NOTE — Plan of Care (Signed)
Patient presents is a pleasant mood stating that he slept "so-so" last night. Patient understands general information about his care. Patient has the ability to manage health-related needs. Patient's clinical measurements/diagnostic test results are progressing to normal limits. Patient has remained free from infection. Patient has been free from complications. Patient has been engaged on the unit and participating in unit activities. Patient has maintained adequate nutrition during this visit. Patient denies any anxiety at this time. Patient has remained free from injury during this visit and is safe on the unit at this time. Patient denies SI/HI/AVH at this time and contracts with staff if anything changes. Patient has been compliant with treatment plan/medication regimen.

## 2017-01-19 NOTE — [facility] Group Notes (Signed)
LCSW Group Therapy Note 01/19/2017 9:30am  Type of Therapy and Topic:  Group Therapy:  Setting Goals  Participation Level:  Active  Description of Group: In this process group, patients discussed using strengths to work toward goals and address challenges.  Patients identified two positive things about themselves and one goal they were working on.  Patients were given the opportunity to share openly and support each other's plan for self-empowerment.  The group discussed the value of gratitude and were encouraged to have a daily reflection of positive characteristics or circumstances.  Patients were encouraged to identify a plan to utilize their strengths to work on current challenges and goals.  Therapeutic Goals 1. Patient will verbalize personal strengths/positive qualities and relate how these can assist with achieving desired personal goals 2. Patients will verbalize affirmation of peers plans for personal change and goal setting 3. Patients will explore the value of gratitude and positive focus as related to successful achievement of goals 4. Patients will verbalize a plan for regular reinforcement of personal positive qualities and circumstances.  Summary of Patient Progress: Pt able to meet therapeutic goals listed above.  Shares about importance of supports in his wellness.   Therapeutic Modalities Cognitive Behavioral Therapy Motivational Interviewing    Shane MacSara [name], LCSW 01/19/2017 12:24 PM

## 2017-01-19 NOTE — [facility] Suicide Risk Assessment (Signed)
[facility] INPATIENT:  Family/Significant Other Suicide Prevention Education  Suicide Prevention Education:  Education Completed; Shane KrillDorris Allen, pt's mother, at [phone] has been identified by the patient as the family member/significant other with whom the patient will be residing, and identified as the person(s) who will aid the patient in the event of a mental health crisis (suicidal ideations/suicide attempt).  With written consent from the patient, the family member/significant other has been provided the following suicide prevention education, prior to the and/or following the discharge of the patient.  The suicide prevention education provided includes the following:  Suicide risk factors  Suicide prevention and interventions  National Suicide Hotline telephone number  [facility] assessment telephone number  [facility] Emergency Assistance 911  [facility] and/or Residential Mobile Crisis Unit telephone number  Request made of family/significant other to:  Remove weapons (e.g., guns, rifles, knives), all items previously/currently identified as safety concern.    Remove drugs/medications (over-the-counter, prescriptions, illicit drugs), all items previously/currently identified as a safety concern.  The family member/significant other verbalizes understanding of the suicide prevention education information provided.  The family member/significant other agrees to remove the items of safety concern listed above.  Shane ShearsCassandra  Shane Allen 01/19/2017, 1:31 PM

## 2017-01-19 NOTE — [facility] Group Notes (Signed)
LCSW Group Therapy Note  01/19/2017 1:00am  Type of Therapy/Topic:  Group Therapy:  Feelings about Diagnosis  Participation Level:  Active   Description of Group:   This group will allow patients to explore their thoughts and feelings about diagnoses they have received. Patients will be guided to explore their level of understanding and acceptance of these diagnoses. Facilitator will encourage patients to process their thoughts and feelings about the reactions of others to their diagnosis and will guide patients in identifying ways to discuss their diagnosis with significant others in their lives. This group will be process-oriented, with patients participating in exploration of their own experiences, giving and receiving support, and processing challenge from other group members.   Therapeutic Goals: 1. Patient will demonstrate understanding of diagnosis as evidenced by identifying two or more symptoms of the disorder 2. Patient will be able to express two feelings regarding the diagnosis 3. Patient will demonstrate their ability to communicate their needs through discussion and/or role play  Summary of Patient Progress:  Pt able to meet therapeutic goals above.  Shares his initial denial of his diagnosis of schizophrenia.  He shares his challenges of staying on medications even though he recognizes that they help.  CSW challenged beliefs about taking medications making him "less brave" He verbalizes that he knows he should not judge himself so harshly about needing medicine but that it is something he is working on.    Therapeutic Modalities:   Cognitive Behavioral Therapy Brief Therapy Feelings Identification    Glennon MacSara P Aadan Chenier, LCSW 01/19/2017 2:28 PM

## 2017-01-19 NOTE — Progress Notes (Signed)
D: Received patient from emergency department. Patient skin assessment completed with ABI RN, skin is intact, no contraband found. Pt. Was admitted under the services of Dr. Jennet MaduroPucilowska. Pt. Offered a meal and snack.    A: Patient oriented to unit/room/call light.Patient was encourage to participate in unit activities and continue with plan of care. Q x 15 minute observation checks were completed for safety.   R: Patient is receptive to treatment and safety maintained on unit.

## 2017-01-19 NOTE — Progress Notes (Signed)
D:Pt denies SI/HI, but admits to AVH upon first admission to the ED. When asked about details regarding auditory and visual hallucinations the pt. States, "It depends on the time, but sometimes I hear voices saying mean things to me and other times I hear just people talking". When asked about visual hallucinations the pt. States, "It's hard to explain, but I basically look at something then I see it in front of my eyes for awhile..it trips me out". Pt is pleasant and cooperative upon interaction and was compliant with the admissions process this evening.  Pt. Reports not having any active auditory or visual hallucinations at this time, but reports these are his major problems for admission. Pt. States, "I feel after I get my meds these problems go away". Pt. Denies any current problems this evening. Pt. Stated, "I'm here, because I felt my hallucinations and this hard time I'm going through, so I called the police to give me a ride and they took me here".   A: Q x 15 minute observation checks were completed for safety. Patient was provided with education. Patient  was encourage to attend groups, participate in unit activities and continue with plan of care.   R:Patient is complaint unit procedures.             Patient slept for Estimated Hours of 6; Precautionary checks every 15 minutes for safety maintained, room free of safety hazards, patient sustains no injury or falls during this shift.

## 2017-01-19 NOTE — Progress Notes (Signed)
D- Patient alert and oriented. Patient presents in a pleasant mood. Patient denies SI, HI, AVH, and pain at this time. Patient states that he slept "so-so" last night. Patient states that he does not have any complaints at this time.  A- Scheduled medications administered to patient, per MD orders. Support and encouragement provided.  Routine safety checks conducted every 15 minutes.  Patient informed to notify staff with problems or concerns.  R- No adverse drug reactions noted. Patient contracts for safety at this time. Patient compliant with medications and treatment plan. Patient receptive, calm, and cooperative. Patient interacts well with others on the unit.  Patient remains safe at this time.

## 2017-01-20 MED ORDER — RISPERIDONE 3 MG PO TABS: 3.0000 mg | ORAL_TABLET | Freq: Two times a day (BID) | ORAL | 1 refills | Status: DC

## 2017-01-20 MED ORDER — TRAZODONE HCL 100 MG PO TABS: 100.0000 mg | ORAL_TABLET | Freq: Every evening | ORAL | 1 refills | Status: DC | PRN

## 2017-01-20 NOTE — [facility] Group Notes (Signed)
01/20/2017 9:30am  Type of Therapy/Topic:  Group Therapy:  Emotion Regulation  Participation Level:  Active   Description of Group:   The purpose of this group is to assist patients in learning to regulate negative emotions and experience positive emotions. Patients will be guided to discuss ways in which they have been vulnerable to their negative emotions. These vulnerabilities will be juxtaposed with experiences of positive emotions or situations, and patients will be challenged to use positive emotions to combat negative ones. Special emphasis will be placed on coping with negative emotions in conflict situations, and patients will process healthy conflict resolution skills.  Therapeutic Goals: 1. Patient will identify two positive emotions or experiences to reflect on in order to balance out negative emotions 2. Patient will label two or more emotions that they find the most difficult to experience 3. Patient will demonstrate positive conflict resolution skills through discussion and/or role plays  Summary of Patient Progress:  Stayed the entire time, actively engaged. Shane NeedleMichael states that he is "feeling good about getting discharged". He was able to identify how different situations has caused him to be vulnerable with his emotions as well as ways to manage his emotions via medication management. Mood was good, based in reality.      Therapeutic Modalities:   Cognitive Behavioral Therapy Feelings Identification Dialectical Behavioral Therapy   [name]  Lazarius Rivkin, LCSW 01/20/2017 11:15 AM

## 2017-01-20 NOTE — Progress Notes (Signed)
Patient ID: Shane Allen, male   DOB: 08/03/1985, 31 y.o.   MRN: [id number]  Discharge Note:  Patient denies SI/HI/AVH at this time. Discharge instructions, AVS, transition record, and prescriptions gone over with patient. Patient belongings returned to patient. Patient agrees to comply with medication management, follow-up visit, and outpatient therapy. Patient questions and concerns addressed and answered. Patient ambulatory off unit. Patient discharged to home with mom.

## 2017-01-20 NOTE — Progress Notes (Signed)
Patient ID: Shane Allen, male   DOB: 10/25/1985, 31 y.o.   MRN: [id number] Neat appearance, visible and interacting well with peers, pleasant on approach, denied pain, A&Ox3, thoughts are slightly disorganized, off the chart when engaged in open ended conversation, poor insight to his disease process, "I think I will go home tonight if my mom calls back ..." Trazodone 100 mg PO PRN given for sleep at bedtime.

## 2017-01-20 NOTE — Progress Notes (Signed)
Recreation Therapy Notes  Date: 11.28.18  Time: 9:30 am  Location: Craft Room  Behavioral response: N/A  Intervention Topic: Stress  Discussion/Intervention: Patient did not attend group.   Clinical Observations/Feedback:  Patient did not attend group. Alfonzia Woolum LRT/CTRS         Louan Base 01/20/2017 12:49 PM

## 2017-01-20 NOTE — Discharge Summary (Signed)
Physician Discharge Summary Note  Patient:  Shane Allen is an 31 y.o., male MRN:  [id number] DOB:  [birth year] Patient phone:  [phone] (home)  Patient address:   [address] [place] [address],  Total Time spent with patient: 30 minutes  Date of Admission:  01/18/2017 Date of Discharge: 01/20/2017  Reason for Admission:  Psychotic break  History of Present Illness:   Identifying data. [name] is a 31 year old male with a history of schizophrenia.  Chief complaint. "I am so much better already."  History of present illness. Information was obtained from the patient and the chart. The patient came to the ER complaining of auditory and visual hallucinations for about a week. He hears chatter of voices and has some visual distortions when objects come really close in front of his eyes.  He run out of medications, likely Latuda, came to the ER on 11/19 asking for prescription but could not afford it. It is likely that in the past he has been receiving Latuda from [facility] pharmacy. Apparently the patient has not been taking medications exactly as prescribed and his psychiatrist, Dr. Teena Irani fired him. He is now assigned to Dr. [name] at [facility] but has not yet made appointment with him. He denies other psychotic symptoms or symptoms suggestive of bipolar mania. He denies depression but "gets teary" at times. He denies any symptoms of anxiety. Reports good sleep. Denies using drugs or alcohol.  He was started on Risperdal in the ER and already reports improvement. There are no longer visions and the voices are quieter.  Past psychiatric history. There were several psychiatric hospitalizations beginning in 2014. He never attempted suicide. He did not respond to Abilify and experienced tachycardia, had dystonia on Haldol, Was sleepy on Invega, could not afford latuda. He was on navane at some point.  Family psychiatric history. None reported.   Social history. He  lives with his mother and does not have disability or insurance. Has appointment with [facility] psychiatrist on 11/30 and 11:30. He dropped out of school in "6th or 9th grade" to start working to pay the bills. He enjoys playing piano and guitar. He stays busy doing chores, walking and playing music.  Principal Problem: Schizophrenia, undifferentiated (HCC) Discharge Diagnoses: Patient Active Problem List   Diagnosis Date Noted  . Noncompliance [Z91.19] 03/27/2016  . Schizophrenia, undifferentiated (HCC) [F20.3] 08/17/2015  . Tobacco use disorder [F17.200] 10/27/2014   Past Medical History:  Past Medical History:  Diagnosis Date  . 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  Shane Allen affective schizophrenia [facility])     Past Surgical History:  Procedure Laterality Date  . NO PAST SURGERIES     Family History: History reviewed. No pertinent family history.   Social History:  Social History   Substance and Sexual Activity  Alcohol Use No   Comment: 4 to 5 beers a day every other week for sleep     Social History   Substance and Sexual Activity  Drug Use No    Social History   Socioeconomic History  . Marital status: Single    Spouse name: None  . Number of children: None  . Years of education: None  . Highest education level: None  Social Needs  . Financial resource strain: None  . Food insecurity - worry: None  . Food insecurity - inability: None  . Transportation needs - medical: None  . Transportation needs -  non-medical: None  Occupational History  . None  Tobacco Use  . Smoking status: Current Every Day Smoker    Packs/day: 1.00    Years: 10.00    Pack years: 10.00    Types: Cigarettes  . Smokeless tobacco: Never Used  Substance and Sexual Activity  . Alcohol use: No    Comment: 4 to 5 beers a day every other week for sleep  . Drug use: No  . Sexual activity: No    Birth control/protection: None  Other Topics  Concern  . None  Social History Narrative  . None    Hospital Course:    [name] is a 31 year old male with a history of schizophrenia admitted for auditory and visual hallucinations in the context of medication noncompliance as he could not afford Latuda.  #Psychosis, resolved -increase Risperdal to 3 mg BID  #Smoking -Nicotine patch was available  #Metabolic syndrome monitoring -Lipid panel, TSH and HgbA1C are normal -EKG, QTc 384  #Disposition -discharge to home with the mother -[facility]  Physical Findings: AIMS: Facial and Oral Movements Muscles of Facial Expression: None, normal Lips and Perioral Area: None, normal Jaw: None, normal Tongue: None, normal,Extremity Movements Upper (arms, wrists, hands, fingers): None, normal Lower (legs, knees, ankles, toes): None, normal, Trunk Movements Neck, shoulders, hips: None, normal, Overall Severity Severity of abnormal movements (highest score from questions above): None, normal Incapacitation due to abnormal movements: None, normal Patient's awareness of abnormal movements (rate only patient's report): No Awareness, Dental Status Current problems with teeth and/or dentures?: No Does patient usually wear dentures?: No  CIWA:    COWS:     Musculoskeletal: Strength & Muscle Tone: within normal limits Gait & Station: normal Patient leans: N/A  Psychiatric Specialty Exam: Physical Exam  Nursing note and vitals reviewed. Psychiatric: He has a normal mood and affect. His speech is normal and behavior is normal. Judgment and thought content normal. Cognition and memory are normal.    Review of Systems  Neurological: Negative.   Psychiatric/Behavioral: Negative.   All other systems reviewed and are negative.   Blood pressure (!) 127/92, pulse 87, temperature 97.9 F (36.6 C), temperature source Oral, resp. rate 18, height 5\' 10"  (1.778 m), weight 117 kg (258 lb), SpO2 100 %.Body mass index is 37.02  kg/m.  General Appearance: Casual  Eye Contact:  Good  Speech:  Clear and Coherent  Volume:  Normal  Mood:  Euthymic  Affect:  Appropriate  Thought Process:  Goal Directed and Descriptions of Associations: Intact  Orientation:  Full (Time, Place, and Person)  Thought Content:  WDL  Suicidal Thoughts:  No  Homicidal Thoughts:  No  Memory:  Immediate;   Fair Recent;   Fair Remote;   Fair  Judgement:  Impaired  Insight:  Present  Psychomotor Activity:  Normal  Concentration:  Concentration: Fair and Attention Span: Fair  Recall:  [employer]Fair  Fund of Knowledge:  Fair  Language:  Fair  Akathisia:  No  Handed:  Right  AIMS (if indicated):     Assets:  Communication Skills Desire for Improvement Housing Physical Health Resilience Social Support  ADL's:  Intact  Cognition:  WNL  Sleep:  Number of Hours: 6        Has this patient used any form of tobacco in the last 30 days? (Cigarettes, Smokeless Tobacco, Cigars, and/or Pipes) Yes, Yes, A prescription for an FDA-approved tobacco cessation medication was offered at discharge and the patient refused  Blood Alcohol  level:  Lab Results  Component Value Date   ETH <10 01/17/2017   ETH <10 01/11/2017    Metabolic Disorder Labs:  Lab Results  Component Value Date   HGBA1C 5.4 01/19/2017   MPG 108.28 01/19/2017   Lab Results  Component Value Date   PROLACTIN 21.7 (H) 08/18/2015   Lab Results  Component Value Date   CHOL 162 01/19/2017   TRIG 170 (H) 01/19/2017   HDL 34 (L) 01/19/2017   CHOLHDL 4.8 01/19/2017   VLDL 34 01/19/2017   LDLCALC 94 01/19/2017   LDLCALC 198 (H) 08/18/2015    See Psychiatric Specialty Exam and Suicide Risk Assessment completed by Attending Physician prior to discharge.  Discharge destination:  Home  Is patient on multiple antipsychotic therapies at discharge:  No   Has Patient had three or more failed trials of antipsychotic monotherapy by history:  No  Recommended Plan for Multiple  Antipsychotic Therapies: NA  Discharge Instructions    Diet - low sodium heart healthy   Complete by:  As directed    Increase activity slowly   Complete by:  As directed      Allergies as of 01/20/2017      Reactions   Haldol [haloperidol Lactate] Other (See Comments)   Uncontrolled muscle movement   Maalox [calcium Carbonate Antacid] Other (See Comments)   Thinks this medication makes his face droop, but it could be another medication.   Tylenol With Codeine #3 [acetaminophen-codeine] Palpitations      Medication List    STOP taking these medications   divalproex 250 MG DR tablet Commonly known as:  DEPAKOTE   lurasidone 40 MG Tabs tablet Commonly known as:  LATUDA     TAKE these medications     Indication  risperiDONE 3 MG tablet Commonly known as:  RISPERDAL Take 1 tablet (3 mg total) by mouth 2 (two) times daily.  Indication:  Schizophrenia   traZODone 100 MG tablet Commonly known as:  DESYREL Take 1 tablet (100 mg total) by mouth at bedtime as needed for sleep. What changed:    medication strength  how much to take  Indication:  Trouble Sleeping      Follow-up Information    [facility] Follow up on 01/28/2017.   Why:  [facility] 10[facility]45[facility] on Thursday, 01/28/17. Thank you! Contact information: [address] [place] [address] [phone]           Follow-up recommendations:  Activity:  as tolerated Diet:  low sodium heart healthy Other:  keep follow up appointments  Comments:     Signed: Kristine LineaJolanta Kenzington Mielke, MD 01/20/2017, 9:05 AM

## 2017-01-20 NOTE — Progress Notes (Signed)
  [facility] Discharge [facility] :  Will you be returning to the same living situation after discharge:  Yes,  with mother At discharge, do you have transportation home?: Yes,  mother. Do you have the ability to pay for your medications: Yes,  insurance  Release of information consent forms completed and turned into Medical Records by CSW.  Patient to Follow up at: Follow-up Information    [facility] Follow up on 01/28/2017.   Why:  [facility] 10[facility]45[facility] on Thursday, 01/28/17. Thank you! Contact information: [address] [place] [address] [phone]           Next level of care provider [facility] Link:no  Safety Planning and Suicide Prevention discussed: Yes,  with pt's mother.     Has patient been referred to the Quitline?: N/A patient is not a smoker  Patient has been referred for addiction treatment: N/A  Heidi DachKelsey Tarrah Furuta, LCSW 01/20/2017, 9:20 AM

## 2017-01-20 NOTE — Tx Team (Signed)
Interdisciplinary Treatment and Diagnostic Plan Update  01/20/2017 Time of Session: [facility] MRN: [id number]  Principal Diagnosis: Schizophrenia, undifferentiated ([place])  Secondary Diagnoses: Principal Problem:   Schizophrenia, undifferentiated ([place]) Active Problems:   Tobacco use disorder   Noncompliance   Current Medications:  Current Facility-Administered Medications  Medication Dose Route Frequency Provider Last Rate Last Dose  . acetaminophen (TYLENOL) tablet 650 mg  650 mg Oral Q6H PRN Clapacs, John T, MD      . alum & mag hydroxide-simeth (MAALOX/MYLANTA) 200-200-20 MG/5ML suspension 30 mL  30 mL Oral Q4H PRN Clapacs, John T, MD      . benztropine (COGENTIN) tablet 0.5 mg  0.5 mg Oral Q6H PRN Clapacs, John T, MD      . hydrOXYzine (ATARAX/VISTARIL) tablet 50 mg  50 mg Oral TID PRN Clapacs, John T, MD      . magnesium hydroxide (MILK OF MAGNESIA) suspension 30 mL  30 mL Oral Daily PRN Clapacs, John T, MD      . nicotine (NICODERM CQ - dosed in mg/24 hours) patch 21 mg  21 mg Transdermal Daily Pucilowska, Jolanta B, MD   21 mg at 01/19/17 1430  . risperiDONE (RISPERDAL) tablet 3 mg  3 mg Oral BID Pucilowska, Jolanta B, MD   3 mg at 01/20/17 0859  . traZODone (DESYREL) tablet 100 mg  100 mg Oral QHS PRN Clapacs, Madie Reno, MD   100 mg at 01/19/17 2256   PTA Medications: Medications Prior to Admission  Medication Sig Dispense Refill Last Dose  . divalproex (DEPAKOTE) 250 MG DR tablet Take 250 mg by mouth 3 (three) times daily.   05/18/2016 at 1000  . lurasidone (LATUDA) 40 MG TABS tablet Take 40 mg daily with breakfast by mouth.     . lurasidone (LATUDA) 40 MG TABS tablet Take 1 tablet (40 mg total) daily with breakfast by mouth. 10 tablet 0   . traZODone (DESYREL) 50 MG tablet Take 1 tablet (50 mg total) by mouth at bedtime as needed for sleep. 30 tablet 0 05/17/2016 at Unknown time    Patient Stressors: Medication change or noncompliance Other:  hallucinations  Patient Strengths: Ability for insight Average or above average intelligence Communication skills General fund of knowledge Motivation for treatment/growth Supportive family/friends Work skills  Treatment Modalities: Medication Management, Group therapy, Case management,  1 to 1 session with clinician, Psychoeducation, Recreational therapy.   Physician Treatment Plan for Primary Diagnosis: Schizophrenia, undifferentiated ([place]) Long Term Goal(s): Improvement in symptoms so as ready for discharge NA   Short Term Goals: Ability to identify changes in lifestyle to reduce recurrence of condition will improve Ability to verbalize feelings will improve Ability to disclose and discuss suicidal ideas Ability to demonstrate self-control will improve Ability to identify and develop effective coping behaviors will improve Compliance with prescribed medications will improve Ability to identify triggers associated with substance abuse/mental health issues will improve NA  Medication Management: Evaluate patient's response, side effects, and tolerance of medication regimen.  Therapeutic Interventions: 1 to 1 sessions, Unit Group sessions and Medication administration.  Evaluation of Outcomes: Met  Physician Treatment Plan for Secondary Diagnosis: Principal Problem:   Schizophrenia, undifferentiated ([place]) Active Problems:   Tobacco use disorder   Noncompliance  Long Term Goal(s): Improvement in symptoms so as ready for discharge NA   Short Term Goals: Ability to identify changes in lifestyle to reduce recurrence of condition will improve Ability to verbalize feelings will improve Ability to disclose and discuss suicidal ideas  Ability to demonstrate self-control will improve Ability to identify and develop effective coping behaviors will improve Compliance with prescribed medications will improve Ability to identify triggers associated with substance abuse/mental health  issues will improve NA     Medication Management: Evaluate patient's response, side effects, and tolerance of medication regimen.  Therapeutic Interventions: 1 to 1 sessions, Unit Group sessions and Medication administration.  Evaluation of Outcomes: Met   RN Treatment Plan for Primary Diagnosis: Schizophrenia, undifferentiated ([place]) Long Term Goal(s): Knowledge of disease and therapeutic regimen to maintain health will improve  Short Term Goals: Ability to verbalize feelings will improve, Ability to identify and develop effective coping behaviors will improve and Compliance with prescribed medications will improve  Medication Management: RN will administer medications as ordered by provider, will assess and evaluate patient's response and provide education to patient for prescribed medication. RN will report any adverse and/or side effects to prescribing provider.  Therapeutic Interventions: 1 on 1 counseling sessions, Psychoeducation, Medication administration, Evaluate responses to treatment, Monitor vital signs and CBGs as ordered, Perform/monitor CIWA, COWS, AIMS and Fall Risk screenings as ordered, Perform wound care treatments as ordered.  Evaluation of Outcomes: Met   LCSW Treatment Plan for Primary Diagnosis: Schizophrenia, undifferentiated ([place]) Long Term Goal(s): Safe transition to appropriate next level of care at discharge, Engage patient in therapeutic group addressing interpersonal concerns.  Short Term Goals: Engage patient in aftercare planning with referrals and resources, Increase emotional regulation and Increase skills for wellness and recovery  Therapeutic Interventions: Assess for all discharge needs, 1 to 1 time with Social worker, Explore available resources and support systems, Assess for adequacy in community support network, Educate family and significant other(s) on suicide prevention, Complete Psychosocial Assessment, Interpersonal group therapy.  Evaluation  of Outcomes: Met   Progress in Treatment: Attending groups: Yes. Participating in groups: Yes. Taking medication as prescribed: Yes. Toleration medication: Yes. Family/Significant other contact made: Yes, individual(s) contacted:  Dorris Haper, pt's mother.  Patient understands diagnosis: Yes. Discussing patient identified problems/goals with staff: Yes. Medical problems stabilized or resolved: Yes. Denies suicidal/homicidal ideation: Yes. Issues/concerns per patient self-inventory: No. Other: None at this time.   New problem(s) identified: No, Describe:  None.  New Short Term/Long Term Goal(s):  Discharge Plan or Barriers:   Reason for Continuation of Hospitalization: Medication stabilization  Estimated Length of Stay: Pt will discharge today, 01/20/17.   Attendees: Patient: Shane Allen 01/20/2017 11:28 AM  Physician: Dr. Bary Leriche, MD 01/20/2017 11:28 AM  Nursing: Tami Lin, RN 01/20/2017 11:28 AM  RN Care Manager: 01/20/2017 11:28 AM  Social Worker: Alden Hipp, LCSW 01/20/2017 11:28 AM  Recreational Therapist: Roanna Epley, LRT/CTRS 01/20/2017 11:28 AM  Other: Lurline Idol, LCSW 01/20/2017 11:28 AM  Other: Darin Engels, [place] 01/20/2017 11:28 AM  Other: 01/20/2017 11:28 AM    Scribe for Treatment Team: Alden Hipp, LCSW 01/20/2017 11:28 AM

## 2017-01-20 NOTE — Plan of Care (Signed)
Patient slept for Estimated Hours of 6; Precautionary checks every 15 minutes for safety maintained, room free of safety hazards, patient sustains no injury or falls during this shift.  

## 2017-01-20 NOTE — [facility] Suicide Risk Assessment (Signed)
[facility]   Principal Problem: Schizophrenia, undifferentiated (HCC) Discharge Diagnoses:  Patient Active Problem List   Diagnosis Date Noted  . Noncompliance [Z91.19] 03/27/2016  . Schizophrenia, undifferentiated (HCC) [F20.3] 08/17/2015  . Tobacco use disorder [F17.200] 10/27/2014    Total Time spent with patient: 30 minutes  Musculoskeletal: Strength & Muscle Tone: within normal limits Gait & Station: normal Patient leans: N/A  Psychiatric Specialty Exam: Review of Systems  Neurological: Negative.   Psychiatric/Behavioral: Negative.   All other systems reviewed and are negative.   Blood pressure (!) 127/92, pulse 87, temperature 97.9 F (36.6 C), temperature source Oral, resp. rate 18, height 5\' 10"  (1.778 m), weight 117 kg (258 lb), SpO2 100 %.Body mass index is 37.02 kg/m.  General Appearance: Casual  Eye Contact::  Good  Speech:  Clear and Coherent409  Volume:  Normal  Mood:  Euthymic  Affect:  Appropriate  Thought Process:  Goal Directed and Descriptions of Associations: Intact  Orientation:  Full (Time, Place, and Person)  Thought Content:  WDL  Suicidal Thoughts:  No  Homicidal Thoughts:  No  Memory:  Immediate;   Fair Recent;   Fair Remote;   Fair  Judgement:  Fair  Insight:  Present  Psychomotor Activity:  Normal  Concentration:  Fair  Recall:  Fis[employer]Fair  Fund of Knowledge:Fair  Language: Fair  Akathisia:  No  Handed:  Right  AIMS (if indicated):     Assets:  Communication Skills Desire for Improvement Housing Physical Health Resilience Social Support  Sleep:  Number of Hours: 6  Cognition: WNL  ADL's:  Intact   Mental Status Per Nursing Assessment::   On Admission:     Demographic Factors:  Male, Adolescent or young adult, Caucasian, Low socioeconomic status and Unemployed  Loss Factors: Financial problems/change in socioeconomic status  Historical Factors: NA  Risk Reduction Factors:   Sense of  responsibility to family, Living with another person, especially a relative, Positive social support and Positive therapeutic relationship  Continued Clinical Symptoms:  Schizophrenia:   Less than 32 years old Paranoid or undifferentiated type  Cognitive Features That Contribute To Risk:  None    Suicide Risk:  Minimal: No identifiable suicidal ideation.  Patients presenting with no risk factors but with morbid ruminations; may be classified as minimal risk based on the severity of the depressive symptoms  Follow-up Information    [facility] Follow up on 01/28/2017.   Why:  [facility]45[facility] on Thursday, 01/28/17. Thank you! Contact information: [address] [place] [address] [phone]           Plan Of Care/Follow-up recommendations:  Activity:  as tolerated Diet:  low sodium heart healthy Other:  keep follow up appointments  Kristine LineaJolanta Pasha Gadison, MD 01/20/2017, 7:08 AM

## 2017-01-21 NOTE — Progress Notes (Signed)
  [facility]  Will you be returning to the same living situation after discharge:  Yes,  Pt returned back home with his family. At discharge, do you have transportation home?: Yes,  Pt's family will be providing transportation home. Do you have the ability to pay for your medications: Yes,  Pt receives financial assistance from his family.  Release of information consent forms completed and in the chart;  Patient's signature needed at discharge.  Patient to Follow up at: Follow-up Information    [facility] Follow up on 01/28/2017.   Why:  [facility] on Thursday, 01/28/17. Thank you! Contact information: [address] [place] [address] [phone]           Next level of care provider [facility] Link:no  Safety Planning and Suicide Prevention discussed: Yes,  Yes, but contact with family/support declined.     Has patient been referred to the Quitline?: N/A patient is not a smoker  Patient has been referred for addiction treatment: N/A  Alease FrameSonya S Rahmel Nedved, LCSW 01/21/2017, 10:30 AM

## 2017-03-19 ENCOUNTER — Emergency Department (HOSPITAL_COMMUNITY)
Admission: EM | Admit: 2017-03-19 | Discharge: 2017-03-19 | Disposition: A | Discharge status: Home / Self Care | Payer: Medicaid Other | Attending: Emergency Medicine | Admitting: Emergency Medicine

## 2017-03-19 ENCOUNTER — Emergency Department (HOSPITAL_COMMUNITY): Payer: Medicaid Other

## 2017-03-19 ENCOUNTER — Encounter (HOSPITAL_COMMUNITY): Payer: Self-pay

## 2017-03-19 DIAGNOSIS — F17210 Nicotine dependence, cigarettes, uncomplicated: Secondary | ICD-10-CM | POA: Diagnosis not present

## 2017-03-19 DIAGNOSIS — K0889 Other specified disorders of teeth and supporting structures: Secondary | ICD-10-CM | POA: Diagnosis present

## 2017-03-19 DIAGNOSIS — Z79899 Other long term (current) drug therapy: Secondary | ICD-10-CM | POA: Insufficient documentation

## 2017-03-19 DIAGNOSIS — K029 Dental caries, unspecified: Secondary | ICD-10-CM | POA: Insufficient documentation

## 2017-03-19 LAB — I-STAT CHEM 8, ED
BUN: 5 mg/dL — ABNORMAL LOW (ref 6–20)
Calcium, Ion: 1.14 mmol/L — ABNORMAL LOW (ref 1.15–1.40)
Chloride: 101 mmol/L (ref 101–111)
Creatinine, Ser: 1 mg/dL (ref 0.61–1.24)
Glucose, Bld: 85 mg/dL (ref 65–99)
HCT: 49 % (ref 39.0–52.0)
Hemoglobin: 16.7 g/dL (ref 13.0–17.0)
Potassium: 4.2 mmol/L (ref 3.5–5.1)
Sodium: 139 mmol/L (ref 135–145)
TCO2: 26 mmol/L (ref 22–32)

## 2017-03-19 MED ORDER — MORPHINE SULFATE (PF) 4 MG/ML IV SOLN
2.0000 mg | Freq: Once | INTRAVENOUS | Status: AC
  Administered 2017-03-19: 2 mg via INTRAVENOUS
  Filled 2017-03-19: qty 1

## 2017-03-19 MED ORDER — IOPAMIDOL (ISOVUE-300) INJECTION 61%
INTRAVENOUS | Status: AC
  Administered 2017-03-19: 75 mL
  Filled 2017-03-19: qty 75

## 2017-03-19 MED ORDER — PENICILLIN V POTASSIUM 500 MG PO TABS: 500.0000 mg | ORAL_TABLET | Freq: Four times a day (QID) | ORAL | 0 refills | Status: AC

## 2017-03-19 MED ORDER — CHLORHEXIDINE GLUCONATE 0.12 % MT SOLN: 15.0000 mL | Freq: Two times a day (BID) | OROMUCOSAL | 0 refills | Status: DC

## 2017-03-19 MED ORDER — CLINDAMYCIN PHOSPHATE 600 MG/50ML IV SOLN
600.0000 mg | Freq: Once | INTRAVENOUS | Status: DC
  Filled 2017-03-19: qty 50

## 2017-03-19 MED ORDER — IBUPROFEN 600 MG PO TABS: 600.0000 mg | ORAL_TABLET | Freq: Four times a day (QID) | ORAL | 0 refills | Status: DC | PRN

## 2017-03-19 MED ORDER — IBUPROFEN 400 MG PO TABS
600.0000 mg | ORAL_TABLET | Freq: Once | ORAL | Status: AC
  Administered 2017-03-19: 23:00:00 600 mg via ORAL
  Filled 2017-03-19: qty 1

## 2017-03-19 NOTE — ED Triage Notes (Signed)
PT reports his whole mouth and all of his teeth hurt for 15 years. Pt states he has never been to dentist.

## 2017-03-19 NOTE — Discharge Instructions (Signed)
Please take all of your antibiotics until finished!   You may develop abdominal discomfort or diarrhea from the antibiotic.  You may help offset this with probiotics which you can buy or get in yogurt. Do not eat  or take the probiotics until 2 hours after your antibiotic.  ° °Apply warm compresses to jaw throughout the day. Alternate 600 mg of ibuprofen and 500-1000 mg of Tylenol every 3 hours as needed for pain. Do not exceed 4000 mg of Tylenol daily.  You may also use warm water salt gargles, Orajel, or other over-the-counter dental pain remedies. Use Peridex mouthwash twice daily. Followup with a dentist is very important for ongoing evaluation and management of recurrent dental pain. Return to emergency department for emergent changing or worsening symptoms such as fever, worsening facial swelling, difficulty breathing or swallowing, throat tightness, or vision changes. ° °

## 2017-03-19 NOTE — ED Provider Notes (Signed)
[facility] Provider Note   CSN: [id number] Arrival date & time: 03/19/17  1832     History   Chief Complaint Chief Complaint  Patient presents with  . Dental Pain    HPI Shane Allen is a 32 y.o. male with history of schizophrenia and tobacco use disorder presents to chief complaint acute onset, progressively worsening dental pain for 1 week.  States pain is primarily localized to the upper and lower incisors but radiates "all over my face ".  Pain is sharp, throbbing, and aching.  He does endorse sensitivity to hot foods and cold foods as well as air.  He denies fevers but endorses headache secondary to pain.  He has tried ibuprofen without significant relief of his symptoms.  He states that he has had ongoing and intermittent dental pain for several years but is unable to follow-up with a dentist due to financial constraints.  He denies throat tightness but states that he feels that his face has been swelling and he began spitting up clear sputum earlier today.  He denies cough or shortness of breath.  No chest pain.  States he has been tolerating food and fluids orally without difficulty.  The history is provided by the patient.    Past Medical History:  Diagnosis Date  . 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  [name] affective schizophrenia [facility])     Patient Active Problem List   Diagnosis Date Noted  . Noncompliance 03/27/2016  . 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 Tobacco use disorder 10/27/2014    Past Surgical History:  Procedure Laterality Date  . NO PAST SURGERIES         Home Medications    Prior to Admission medications   Medication Sig Start Date End Date Taking? Authorizing Provider  chlorhexidine (PERIDEX) 0.12 % solution Use as directed 15 mLs in the mouth or throat 2 (two) times daily. 03/19/17   Demone Lyles A, PA-C  ibuprofen  (ADVIL,MOTRIN) 600 MG tablet Take 1 tablet (600 mg total) by mouth every 6 (six) hours as needed. 03/19/17   Klair Leising A, PA-C  penicillin v potassium (VEETID) 500 MG tablet Take 1 tablet (500 mg total) by mouth 4 (four) times daily for 7 days. 03/19/17 03/26/17  Michela Pitcher A, PA-C  risperiDONE (RISPERDAL) 3 MG tablet Take 1 tablet (3 mg total) by mouth 2 (two) times daily. 01/20/17   Pucilowska, Braulio Conte B, MD  traZODone (DESYREL) 100 MG tablet Take 1 tablet (100 mg total) by mouth at bedtime as needed for sleep. 01/20/17   Shari Prows, MD    Family History No family history on file.  Social History Social History   Tobacco Use  . Smoking status: Current Every Day Smoker    Packs/day: 1.00    Years: 10.00    Pack years: 10.00    Types: Cigarettes  . Smokeless tobacco: Never Used  Substance Use Topics  . Alcohol use: No    Comment: 4 to 5 beers a day every other week for sleep  . Drug use: No     Allergies   Haldol [haloperidol lactate]; Maalox [calcium carbonate antacid]; and Tylenol with codeine #3 [acetaminophen-codeine]   Review of Systems Review of Systems  Constitutional: Negative for chills and fever.  HENT: Positive for dental problem and drooling. Negative for sore throat and trouble swallowing.   Eyes: Negative for photophobia  and visual disturbance.  Respiratory: Negative for cough and shortness of breath.   Cardiovascular: Negative for chest pain.  Gastrointestinal: Negative for abdominal pain, nausea and vomiting.  Neurological: Positive for headaches.  All other systems reviewed and are negative.    Physical Exam Updated Vital Signs BP 118/68 (BP Location: Right Arm)   Pulse 78   Temp 98.6 F (37 C)   Resp 16   SpO2 99%   Physical Exam  Constitutional: He appears well-developed and well-nourished. No distress.  HENT:  Head: Normocephalic and atraumatic.  Right Ear: External ear normal.  Left Ear: External ear normal.  Mouth/Throat: Uvula  is midline and mucous membranes are normal. There is trismus in the jaw. Abnormal dentition. Dental caries present. No tonsillar exudate.    Diffusely decaying dentition, primarily with the upper incisors which are cracked with exposed dentin.  Moderate gingival irritation noted.  Diffuse tenderness to percussion of the teeth.  He exhibits trismus secondary to pain.  Posterior oropharynx unremarkable  Eyes: Conjunctivae and EOM are normal. Pupils are equal, round, and reactive to light. Right eye exhibits no discharge. Left eye exhibits no discharge.  Mild pain with EOMs, mild swelling to the lower lids bilaterally with diffuse generalized tenderness to palpation of the face.  No chemosis or proptosis noted.  Neck: Normal range of motion. Neck supple. No JVD present. No tracheal deviation present.  Pain worsens with range of motion of the neck.  Anterior tenderness to palpation of the neck noted. No midline spine TTP, no paraspinal muscle tenderness, no deformity, crepitus, or step-off noted   Cardiovascular: Normal rate.  Pulmonary/Chest: Effort normal.  Abdominal: Soft. He exhibits no distension.  Musculoskeletal: He exhibits no edema.  Neurological: He is alert.  Skin: Skin is warm and dry. No erythema.  Psychiatric: He has a normal mood and affect. His behavior is normal.  Nursing note and vitals reviewed.    ED Treatments / Results  Labs (all labs ordered are listed, but only abnormal results are displayed) Labs Reviewed  I-STAT CHEM 8, ED - Abnormal; Notable for the following components:      Result Value   BUN 5 (*)    Calcium, Ion 1.14 (*)    All other components within normal limits    EKG  EKG Interpretation None       Radiology Ct Soft Tissue Neck W Contrast  Result Date: 03/19/2017 CLINICAL DATA:  Initial evaluation for dental caries, oral pain. EXAM: CT NECK WITH CONTRAST TECHNIQUE: Multidetector CT imaging of the neck was performed using the standard protocol  following the bolus administration of intravenous contrast. CONTRAST:  75mL ISOVUE-300 IOPAMIDOL (ISOVUE-300) INJECTION 61% COMPARISON:  None. FINDINGS: Pharynx and larynx: Oral cavity within normal limits without mass lesion or loculated fluid collection. Extremely poor dentition with innumerable dental caries in scattered periapical lucencies seen throughout the remaining dentition. No discrete odontogenic abscess or acute inflammatory changes seen about the teeth. Palatine tonsils symmetric and normal. Parapharyngeal fat preserved. Nasopharynx normal. Retropharyngeal soft tissues normal. Epiglottis normal. Vallecula largely effaced by the lingual tonsils. Remainder of the hypopharynx and supraglottic larynx within normal limits. True cords apposed but grossly normal. Subglottic airway clear. Salivary glands: Parotid and submandibular glands are normal. Thyroid: Thyroid normal. Lymph nodes: No pathologically enlarged lymph nodes identified within the neck. Vascular: Normal intravascular enhancement seen throughout the neck. Limited intracranial: Unremarkable. Visualized orbits: Globes and orbital soft tissues within normal limits. Mastoids and visualized paranasal sinuses: Mild maxillary sinus mucosal thickening,  greater on the left. Paranasal sinuses are otherwise clear. Mastoid air cells and middle ear cavities are well pneumatized and clear. Skeleton: No acute osseus abnormality. No worrisome lytic or blastic osseous lesions. Upper chest: Visualized upper chest within normal limits. Partially visualized lungs are clear. Other: None. IMPRESSION: 1. Poor dentition with innumerable scattered dental caries. No discrete odontogenic abscess or acute inflammatory changes about the teeth at this time. Dental referral recommended. 2. Otherwise normal neck CT. Electronically Signed   By: Rise MuBenjamin  McClintock M.D.   On: 03/19/2017 22:39    Procedures Procedures (including critical care time)  Medications Ordered in  ED Medications  clindamycin (CLEOCIN) IVPB 600 mg (0 mg Intravenous Stopped 03/19/17 2215)  morphine 4 MG/ML injection 2 mg (2 mg Intravenous Given 03/19/17 2200)  iopamidol (ISOVUE-300) 61 % injection (75 mLs  Contrast Given 03/19/17 2207)  ibuprofen (ADVIL,MOTRIN) tablet 600 mg (600 mg Oral Given 03/19/17 2310)     Initial Impression / Assessment and Plan / ED Course  I have reviewed the triage vital signs and the nursing notes.  Pertinent labs & imaging results that were available during my care of the patient were reviewed by me and considered in my medical decision making (see chart for details).     Patient with diffusely decaying dentition, especially of the upper incisors.  He has had intermittent dental pain for several years.  Afebrile, vital signs are stable.  He is nontoxic in appearance.No fever or meningeal signs to suggest meningitis. However, with complaints of neck tenderness anteriorly as well as trismus on examination (which she states is secondary to pain), we will obtain CT scan to evaluate for possible dental abscess, Ludwig's angina, or airway compromise.   Imaging shows innumerable dental caries with no odontogenic abscess.  He is tolerating secretions without difficulty.  He is able to drink p.o. fluids in the ED without difficulty.  There does not appear to be any airway compromise.  Pain has been managed and he was given IV clindamycin in the ED.  We will discharge with antibiotics, chlorhexidine mouthwash, and ibuprofen.  Discussed the importance of follow-up with a dentist and he was given resources for dental follow-up on an outpatient basis.  Discussed indications for return to the ED. Pt verbalized understanding of and agreement with plan and is safe for discharge home at this time. He has no complaints prior to discharge.  Final Clinical Impressions(s) / ED Diagnoses   Final diagnoses:  Dental caries  Pain, dental    ED Discharge Orders        Ordered     penicillin v potassium (VEETID) 500 MG tablet  4 times daily     03/19/17 2248    chlorhexidine (PERIDEX) 0.12 % solution  2 times daily     03/19/17 2248    ibuprofen (ADVIL,MOTRIN) 600 MG tablet  Every 6 hours PRN     03/19/17 2252       Jeanie SewerFawze, Jerica Creegan A, PA-C 03/20/17 0043    Raeford RazorKohut, Stephen, MD 03/20/17 1642

## 2017-03-20 ENCOUNTER — Other Ambulatory Visit: Payer: Self-pay

## 2017-03-20 ENCOUNTER — Emergency Department (HOSPITAL_COMMUNITY)
Admission: EM | Admit: 2017-03-20 | Discharge: 2017-03-20 | Disposition: A | Discharge status: Home / Self Care | Payer: Medicaid Other | Attending: Physician Assistant | Admitting: Physician Assistant

## 2017-03-20 ENCOUNTER — Encounter (HOSPITAL_COMMUNITY): Payer: Self-pay | Admitting: Emergency Medicine

## 2017-03-20 DIAGNOSIS — Z79899 Other long term (current) drug therapy: Secondary | ICD-10-CM | POA: Diagnosis not present

## 2017-03-20 DIAGNOSIS — K0889 Other specified disorders of teeth and supporting structures: Secondary | ICD-10-CM | POA: Diagnosis not present

## 2017-03-20 DIAGNOSIS — F17210 Nicotine dependence, cigarettes, uncomplicated: Secondary | ICD-10-CM | POA: Diagnosis not present

## 2017-03-20 MED ORDER — PENICILLIN V POTASSIUM 250 MG PO TABS
500.0000 mg | ORAL_TABLET | Freq: Once | ORAL | Status: AC
  Administered 2017-03-20: 500 mg via ORAL
  Filled 2017-03-20: qty 2

## 2017-03-20 MED ORDER — IBUPROFEN 200 MG PO TABS
600.0000 mg | ORAL_TABLET | Freq: Once | ORAL | Status: AC
  Administered 2017-03-20: 600 mg via ORAL
  Filled 2017-03-20: qty 1

## 2017-03-20 NOTE — ED Provider Notes (Signed)
[facility] EMERGENCY DEPARTMENT Provider Note   CSN: [id number] Arrival date & time: 03/20/17  1935     History   Chief Complaint Chief Complaint  Patient presents with  . Oral Swelling    HPI Wynona NeatJonathan Levi Brannick is a 32 y.o. male.  HPI   Mr. [name] is a 32 year old male with a history of schizoaffective disorder, tobacco use who presents to the emergency department for evaluation of ongoing dental pain and inability to fill penicillin prescription due to cost issues.  Patient was seen in the emergency department yesterday for this complaint where he got a CT soft tissue scan of the neck given trismus and anterior neck pain and trismus.  Scan showed many dental caries with no odontogenic abscess.  He was subsequently discharged with information to follow-up with a dentist and penicillin.  Patient states that due to cost issues he was unable to fill his prescription for antibiotic. Reports that he came here today to get his antibiotic.  Denies dental pain currently, as he took ibuprofen earlier and it resolved. States that he wants to make sure he gets his antibiotics so that he does not have any further dental problems.  Denies new or worsening symptoms.  States that he had noticed some swelling under the bilateral eyes, although this is not changed since yesterday.  Endorses trismus due to pain.  Denies fevers, chills, dysphagia, shortness of breath.  Past Medical History:  Diagnosis Date  . 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  [name]. Schizo affective schizophrenia [facility](HCC)     Patient Active Problem List   Diagnosis Date Noted  . Noncompliance 03/27/2016  . 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 Tobacco use disorder 10/27/2014    Past Surgical History:  Procedure Laterality Date  . NO PAST SURGERIES         Home Medications    Prior to Admission medications   Medication Sig Start Date End  Date Taking? Authorizing Provider  chlorhexidine (PERIDEX) 0.12 % solution Use as directed 15 mLs in the mouth or throat 2 (two) times daily. 03/19/17   Fawze, Mina A, PA-C  ibuprofen (ADVIL,MOTRIN) 600 MG tablet Take 1 tablet (600 mg total) by mouth every 6 (six) hours as needed. 03/19/17   Fawze, Mina A, PA-C  penicillin v potassium (VEETID) 500 MG tablet Take 1 tablet (500 mg total) by mouth 4 (four) times daily for 7 days. 03/19/17 03/26/17  Michela PitcherFawze, Mina A, PA-C  risperiDONE (RISPERDAL) 3 MG tablet Take 1 tablet (3 mg total) by mouth 2 (two) times daily. 01/20/17   Pucilowska, Braulio ConteJolanta B, MD  traZODone (DESYREL) 100 MG tablet Take 1 tablet (100 mg total) by mouth at bedtime as needed for sleep. 01/20/17   Shari ProwsPucilowska, Jolanta B, MD    Family History No family history on file.  Social History Social History   Tobacco Use  . Smoking status: Current Every Day Smoker    Packs/day: 1.00    Years: 10.00    Pack years: 10.00    Types: Cigarettes  . Smokeless tobacco: Never Used  Substance Use Topics  . Alcohol use: No    Comment: 4 to 5 beers a day every other week for sleep  . Drug use: No     Allergies   Haldol [haloperidol lactate]; Maalox [calcium carbonate antacid]; and Tylenol with codeine #3 [acetaminophen-codeine]   Review of Systems Review of Systems  Constitutional:  Negative for chills and fever.  HENT: Positive for dental problem and facial swelling (under bilateral eyes ).   Respiratory: Negative for shortness of breath.   Gastrointestinal: Negative for abdominal pain, nausea and vomiting.     Physical Exam Updated Vital Signs BP 111/86 (BP Location: Left Arm)   Pulse 87   Temp (!) 97.4 F (36.3 C) (Oral)   Resp 16   Ht 5\' 11"  (1.803 m)   Wt 117.9 kg (260 lb)   SpO2 99%   BMI 36.26 kg/m   Physical Exam  Constitutional: He appears well-developed and well-nourished. No distress.  HENT:  Head: Normocephalic and atraumatic.  Mouth/Throat:    Poor dentition  with several decaying upper teeth and several missing teeth. Pain along teeth as depicted in image. No abscess noted. Midline uvula. Trismus present. OP moist and clear. No oropharyngeal erythema or edema. Neck supple without tenderness.   Eyes: Conjunctivae are normal. Pupils are equal, round, and reactive to light. Right eye exhibits no discharge. Left eye exhibits no discharge.  Mild swelling noted under bilateral lower lids. No erythema, induration or warmth. Generally tender under both lids and face.   Neck: Normal range of motion. Neck supple.  No tenderness over the neck.   Cardiovascular: Normal rate and regular rhythm. Exam reveals no friction rub.  No murmur heard. Pulmonary/Chest: Effort normal and breath sounds normal. No stridor. No respiratory distress. He has no wheezes. He has no rales.  Lymphadenopathy:    He has no cervical adenopathy.  Neurological: He is alert. Coordination normal.  Skin: He is not diaphoretic.  Psychiatric: He has a normal mood and affect. His behavior is normal.  Nursing note and vitals reviewed.    ED Treatments / Results  Labs (all labs ordered are listed, but only abnormal results are displayed) Labs Reviewed - No data to display  EKG  EKG Interpretation None       Radiology Ct Soft Tissue Neck W Contrast  Result Date: 03/19/2017 CLINICAL DATA:  Initial evaluation for dental caries, oral pain. EXAM: CT NECK WITH CONTRAST TECHNIQUE: Multidetector CT imaging of the neck was performed using the standard protocol following the bolus administration of intravenous contrast. CONTRAST:  75mL ISOVUE-300 IOPAMIDOL (ISOVUE-300) INJECTION 61% COMPARISON:  None. FINDINGS: Pharynx and larynx: Oral cavity within normal limits without mass lesion or loculated fluid collection. Extremely poor dentition with innumerable dental caries in scattered periapical lucencies seen throughout the remaining dentition. No discrete odontogenic abscess or acute inflammatory  changes seen about the teeth. Palatine tonsils symmetric and normal. Parapharyngeal fat preserved. Nasopharynx normal. Retropharyngeal soft tissues normal. Epiglottis normal. Vallecula largely effaced by the lingual tonsils. Remainder of the hypopharynx and supraglottic larynx within normal limits. True cords apposed but grossly normal. Subglottic airway clear. Salivary glands: Parotid and submandibular glands are normal. Thyroid: Thyroid normal. Lymph nodes: No pathologically enlarged lymph nodes identified within the neck. Vascular: Normal intravascular enhancement seen throughout the neck. Limited intracranial: Unremarkable. Visualized orbits: Globes and orbital soft tissues within normal limits. Mastoids and visualized paranasal sinuses: Mild maxillary sinus mucosal thickening, greater on the left. Paranasal sinuses are otherwise clear. Mastoid air cells and middle ear cavities are well pneumatized and clear. Skeleton: No acute osseus abnormality. No worrisome lytic or blastic osseous lesions. Upper chest: Visualized upper chest within normal limits. Partially visualized lungs are clear. Other: None. IMPRESSION: 1. Poor dentition with innumerable scattered dental caries. No discrete odontogenic abscess or acute inflammatory changes about the teeth at this time.  Dental referral recommended. 2. Otherwise normal neck CT. Electronically Signed   By: Rise Mu M.D.   On: 03/19/2017 22:39    Procedures Procedures (including critical care time)  Medications Ordered in ED Medications  penicillin v potassium (VEETID) tablet 500 mg (500 mg Oral Given 03/20/17 2156)  ibuprofen (ADVIL,MOTRIN) tablet 600 mg (600 mg Oral Given 03/20/17 2205)     Initial Impression / Assessment and Plan / ED Course  I have reviewed the triage vital signs and the nursing notes.  Pertinent labs & imaging results that were available during my care of the patient were reviewed by me and considered in my medical decision  making (see chart for details).    Presents to the Emergency Department for ongoing dental pain. Reports it is relieved with ibuprofen. Trismus present on exam, per chart review this was present yesterday when he was seen for similar complaint where he had a CT soft tissue neck which was negative for odontogenic abscess. He denies new or worsening symptoms since he was seen yesterday, but concerned because he was not able to fill penicillin prescription due to cost issues. He is afebrile, non-toxic appearing and vital signs stable. Grossly tender over the face. No erythema, warmth or induration over the face to suggest developing cellulitis. No tenderness or swelling over the neck to suggest Ludwig's angina requiring further imaging. Airway patent.   Patient given po penicillin in the ED. Pain has been managed with ibuprofen. Have consulted case management, who is not here tonight but plan to follow up tomorrow. Discussed return precautions and patient agrees.   Final Clinical Impressions(s) / ED Diagnoses   Final diagnoses:  Pain, dental    ED Discharge Orders    None       Kellie Shropshire, PA-C 03/21/17 1349    Mackuen, Cindee Salt, MD 03/23/17 1505

## 2017-03-20 NOTE — Discharge Instructions (Signed)
Case Management will call you tomorrow regarding assistance for antibiotic.   It is very important you call a dentist for further evaluation of your tooth pain.   Please take ibuprofen as needed for pain.   Return to the ER if you have redness and swelling in your neck, fever >100.46F or have trouble breathing or talking due to face swelling.

## 2017-03-20 NOTE — ED Triage Notes (Signed)
Pt to ED via [facility] with reports of dental pain.  Pt was seen here yesterday for same and given Rx for antibiotics but st's he did not have the money to get them filled

## 2017-03-21 ENCOUNTER — Telehealth: Payer: Self-pay | Admitting: Surgery

## 2017-03-21 NOTE — Telephone Encounter (Signed)
ED CM received call from patient regarding not being able to afford to fill his discharge prescription for abx. Patient is uninsured and does not have a PCP and [facility]. Patient enrolled and letter printed and at patient's request [facility] [place], CM confirmed the receipt, patient was notified and informed of the $3 co-pay per prescription patient is agreeable and appreciative for the service. No further ED CM needs identified.

## 2017-05-09 ENCOUNTER — Encounter (HOSPITAL_COMMUNITY): Payer: Self-pay | Admitting: Emergency Medicine

## 2017-05-09 DIAGNOSIS — Z5321 Procedure and treatment not carried out due to patient leaving prior to being seen by health care provider: Secondary | ICD-10-CM | POA: Insufficient documentation

## 2017-05-09 DIAGNOSIS — Z0489 Encounter for examination and observation for other specified reasons: Secondary | ICD-10-CM | POA: Diagnosis present

## 2017-05-09 NOTE — ED Triage Notes (Signed)
Reports coming off of saphris.  Reports not liking side effects from the medication.  States I want to make sure I am safe coming off the medication.

## 2017-05-10 ENCOUNTER — Emergency Department (HOSPITAL_COMMUNITY)
Admission: EM | Admit: 2017-05-10 | Discharge: 2017-05-10 | Disposition: A | Discharge status: Home / Self Care | Payer: Medicaid Other | Attending: Emergency Medicine | Admitting: Emergency Medicine

## 2017-05-10 NOTE — ED Notes (Signed)
Pt got up and sts "thank you for all you've done," packed his belongings and walked out.

## 2017-06-14 ENCOUNTER — Encounter: Payer: Self-pay | Admitting: Emergency Medicine

## 2017-06-14 ENCOUNTER — Other Ambulatory Visit: Payer: Self-pay

## 2017-06-14 ENCOUNTER — Emergency Department
Admission: EM | Admit: 2017-06-14 | Discharge: 2017-06-14 | Disposition: A | Discharge status: Home / Self Care | Payer: Medicaid Other | Attending: Emergency Medicine | Admitting: Emergency Medicine

## 2017-06-14 DIAGNOSIS — R440 Auditory hallucinations: Secondary | ICD-10-CM | POA: Diagnosis not present

## 2017-06-14 DIAGNOSIS — F17210 Nicotine dependence, cigarettes, uncomplicated: Secondary | ICD-10-CM | POA: Diagnosis not present

## 2017-06-14 DIAGNOSIS — Z79899 Other long term (current) drug therapy: Secondary | ICD-10-CM | POA: Diagnosis not present

## 2017-06-14 DIAGNOSIS — Z760 Encounter for issue of repeat prescription: Secondary | ICD-10-CM | POA: Insufficient documentation

## 2017-06-14 MED ORDER — ASENAPINE MALEATE 5 MG SL SUBL
5.0000 mg | SUBLINGUAL_TABLET | Freq: Once | SUBLINGUAL | Status: AC
  Administered 2017-06-14: 5 mg via SUBLINGUAL
  Filled 2017-06-14: qty 1

## 2017-06-14 NOTE — ED Notes (Signed)
Reviewed patient with Dr. Don PerkingVeronese-- patient to be seen medically to determine what medications are needed.

## 2017-06-14 NOTE — Discharge Instructions (Addendum)
You have been seen in the Emergency Department (ED)  today for a psychiatric complaint.  You have been evaluated by psychiatry and we believe you are safe to be discharged from the hospital.   ° °Please return to the Emergency Department (ED)  immediately if you have ANY thoughts of hurting yourself or anyone else, so that we may help you. ° °Please avoid alcohol and drug use. ° °Follow up with your doctor and/or therapist as soon as possible regarding today's ED  visit.  ° °You may call crisis hotline for [place] at [phone]. ° °

## 2017-06-14 NOTE — ED Provider Notes (Signed)
[facility] Emergency Department Provider Note  ____________________________________________  Time seen: Approximately 8:10 PM  I have reviewed the triage vital signs and the nursing notes.   HISTORY  Chief Complaint Medication Refill   HPI Shane Allen is a 32 y.o. male with a history of schizoaffective disorder who presents requesting medication refill.  Patient takes Saphris 5 mg twice daily.  He ran out of his medication 3 days ago.  He reports constant auditory hallucinations which have gotten worse since stopping the medication.  He denies suicidal homicidal ideation.  Patient reports that he has an appointment at the clinic tomorrow to get his medication but is requesting a dose this evening.  Past Medical History:  Diagnosis Date  . 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  [name] affective schizophrenia [facility])     Patient Active Problem List   Diagnosis Date Noted  . Noncompliance 03/27/2016  . 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 Tobacco use disorder 10/27/2014    Past Surgical History:  Procedure Laterality Date  . NO PAST SURGERIES      Prior to Admission medications   Medication Sig Start Date End Date Taking? Authorizing Provider  chlorhexidine (PERIDEX) 0.12 % solution Use as directed 15 mLs in the mouth or throat 2 (two) times daily. 03/19/17   Fawze, Mina A, PA-C  ibuprofen (ADVIL,MOTRIN) 600 MG tablet Take 1 tablet (600 mg total) by mouth every 6 (six) hours as needed. 03/19/17   Fawze, Mina A, PA-C  risperiDONE (RISPERDAL) 3 MG tablet Take 1 tablet (3 mg total) by mouth 2 (two) times daily. 01/20/17   Pucilowska, Braulio Conte B, MD  traZODone (DESYREL) 100 MG tablet Take 1 tablet (100 mg total) by mouth at bedtime as needed for sleep. 01/20/17   Pucilowska, Ellin Goodie, MD    Allergies Haldol [haloperidol lactate]; Maalox [calcium carbonate antacid]; and Tylenol  with codeine #3 [acetaminophen-codeine]  No family history on file.  Social History Social History   Tobacco Use  . Smoking status: Current Every Day Smoker    Packs/day: 1.00    Years: 10.00    Pack years: 10.00    Types: Cigarettes  . Smokeless tobacco: Never Used  Substance Use Topics  . Alcohol use: No    Comment: 4 to 5 beers a day every other week for sleep  . Drug use: No    Review of Systems  Constitutional: Negative for fever. Eyes: Negative for visual changes. ENT: Negative for sore throat. Neck: No neck pain  Cardiovascular: Negative for chest pain. Respiratory: Negative for shortness of breath. Gastrointestinal: Negative for abdominal pain, vomiting or diarrhea. Genitourinary: Negative for dysuria. Musculoskeletal: Negative for back pain. Skin: Negative for rash. Neurological: Negative for headaches, weakness or numbness. Psych: No SI or HI. + Auditory hallucinations  ____________________________________________   PHYSICAL EXAM:  VITAL SIGNS: ED Triage Vitals  Enc Vitals Group     BP 06/14/17 1837 126/80     Pulse Rate 06/14/17 1837 79     Resp --      Temp 06/14/17 1837 99.1 F (37.3 C)     Temp Source 06/14/17 1837 Oral     SpO2 06/14/17 1837 98 %     Weight 06/14/17 1838 259 lb (117.5 kg)     Height 06/14/17 1838 5\' 11"  (1.803 m)     Head Circumference --      Peak Flow --  Pain Score 06/14/17 1855 0     Pain Loc --      Pain Edu? --      Excl. in GC? --     Constitutional: Alert and oriented. Well appearing and in no apparent distress. HEENT:      Head: Normocephalic and atraumatic.         Eyes: Conjunctivae are normal. Sclera is non-icteric.       Mouth/Throat: Mucous membranes are moist.       Neck: Supple with no signs of meningismus. Cardiovascular: Regular rate and rhythm. No murmurs, gallops, or rubs. 2+ symmetrical distal pulses are present in all extremities. No JVD. Respiratory: Normal respiratory effort. Lungs are clear  to auscultation bilaterally. No wheezes, crackles, or rhonchi.  Musculoskeletal: No edema, cyanosis, or erythema of extremities. Neurologic: Normal speech and language. Face is symmetric. Moving all extremities. No gross focal neurologic deficits are appreciated. Skin: Skin is warm, dry and intact. No rash noted. Psychiatric: Mood and affect are blunt. Speech and behavior are normal.  ____________________________________________   LABS (all labs ordered are listed, but only abnormal results are displayed)  Labs Reviewed - No data to display ____________________________________________  EKG  none  ____________________________________________  RADIOLOGY  none  ____________________________________________   PROCEDURES  Procedure(s) performed: None Procedures Critical Care performed:  None ____________________________________________   INITIAL IMPRESSION / ASSESSMENT AND PLAN / ED COURSE   32 y.o. male with a history of schizoaffective disorder who presents requesting medication refill.  Patient was given a dose of 5 mg Saphris this evening in the emergency department.  Has an appointment tomorrow to get his medication.  Recommend he return to the emergency room for suicidal homicidal ideation or if patient is unable to get his medication filled.  At this time patient is safe for discharge and does not meet criteria for involuntary commitment.      As part of my medical decision making, I reviewed the following data within the electronic MEDICAL RECORD NUMBER Nursing notes reviewed and incorporated, Notes from prior ED visits and [place] Controlled Substance Database    Pertinent labs & imaging results that were available during my care of the patient were reviewed by me and considered in my medical decision making (see chart for details).    ____________________________________________   FINAL CLINICAL IMPRESSION(S) / ED DIAGNOSES  Final diagnoses:  Encounter for medication  refill      NEW MEDICATIONS STARTED DURING THIS VISIT:  ED Discharge Orders    None       Note:  This document was prepared using Dragon voice recognition software and may include unintentional dictation errors.    Don PerkingVeronese, [place], MD 06/14/17 2012

## 2017-06-14 NOTE — ED Notes (Addendum)
Pt here asking for a dose of the medication saphris. States he is out since Friday and is working with [name] to try to get refilled. Has no insurance. .[name]

## 2017-06-14 NOTE — ED Triage Notes (Signed)
Patient states he ran out of his medication,  Saphris 5 mg, BID.  Medication last taken Friday.  Has been working with [employer], awaiting approval.  Told it would take 4-6 weeks for approval.  Currently have been waiting 3 weeks.    Patient states he is having auditory hallucinations, worsening since Friday.  Denies SI/ HI.  Patient [facility] [place].

## 2017-06-15 ENCOUNTER — Emergency Department (HOSPITAL_COMMUNITY)
Admission: EM | Admit: 2017-06-15 | Discharge: 2017-06-15 | Disposition: A | Discharge status: Home / Self Care | Payer: Medicaid Other | Attending: Emergency Medicine | Admitting: Emergency Medicine

## 2017-06-15 ENCOUNTER — Encounter (HOSPITAL_COMMUNITY): Payer: Self-pay

## 2017-06-15 DIAGNOSIS — Z9114 Patient's other noncompliance with medication regimen: Secondary | ICD-10-CM | POA: Diagnosis not present

## 2017-06-15 DIAGNOSIS — F17210 Nicotine dependence, cigarettes, uncomplicated: Secondary | ICD-10-CM | POA: Diagnosis not present

## 2017-06-15 DIAGNOSIS — F259 Schizoaffective disorder, unspecified: Secondary | ICD-10-CM | POA: Diagnosis not present

## 2017-06-15 DIAGNOSIS — Z760 Encounter for issue of repeat prescription: Secondary | ICD-10-CM | POA: Diagnosis present

## 2017-06-15 DIAGNOSIS — Z79899 Other long term (current) drug therapy: Secondary | ICD-10-CM | POA: Insufficient documentation

## 2017-06-15 MED ORDER — ASENAPINE MALEATE 5 MG SL SUBL
5.0000 mg | SUBLINGUAL_TABLET | Freq: Once | SUBLINGUAL | Status: AC
  Administered 2017-06-15: 5 mg via SUBLINGUAL
  Filled 2017-06-15: qty 1

## 2017-06-15 MED ORDER — ASENAPINE MALEATE 5 MG SL SUBL: 5.0000 mg | SUBLINGUAL_TABLET | Freq: Two times a day (BID) | SUBLINGUAL | 0 refills | Status: DC

## 2017-06-15 NOTE — Care Management (Addendum)
Late note entry 06/15/17- This RNCM received notification that patient discharged from ED yesterday 06/14/17 with plans to have psych meds filled at [facility]. Barriers to this plan- I'm told- his mother called ED triage stating she would have to bring patient back to ED because [facility] could not [facility] his medications. Patient is also a [place] resident. Mother told ED staff that "she didn't have gas money to take patient ?to [place]".  I have reached out to [facility] [phone] to see if they can assist patient - the person that answered the phone "didn't have any appointments available for today" and transferred me to their pharmacy. The pharmacist said that they cannot assistant patient with Saphris.  I reached back out to patient's mother Clemmie KrillDorris Harper [phone] to find "[facility]. He cannot fill Rx because he doesn't have insurance".   It may take up to 3 weeks to fill medications.  $640 is drug cost per [employer].  [facility] in [facility] pharmacy. I will attempt MATCH override and reach back to patient/mother. MATCH sent via text per mother's request as she doesn't have much gas to drive back to ED to pick it up. I have informed her to make sure [facility] is [facility] [address]  Update at 1530: MATCH will not allow antipsychotic drug such as Saphris. [facility] that I cannot assist with. I have reached out to [facility] [phone] and spoke with Dorene GrebeNatalie with [name] (patient sees [facility] at [facility] [place]).  I explained to natalie that patient is out of his Saphris and [facility] will not be able to assist even in or indigent clinics. I explained that patient shared with me that [facility] would not [facility] this med "because he didn't have drug coverage".  Dorene Grebeatalie [facility] and said they will have to call [facility] [phone] as patient has started  Application for financial assistance however it takes about a month. [facility]. Patient instructed to call [facility] for direction as he will need supplement to Saphris until applications is approved.  This RNCM spoke with patient's mother to update and apologize that I cannot help him with this medications. She said "she did try to get the psychiatrist to write for something else until saphris was approved and they declined". I encouraged her to call [facility] back and explain that he will not have the medication for a month/until application approved and the need for a supplement. She agreed.

## 2017-06-15 NOTE — ED Provider Notes (Signed)
[facility] EMERGENCY DEPARTMENT Provider Note   CSN: [id number] Arrival date & time: 06/15/17  1718     History   Chief Complaint Chief Complaint  Patient presents with  . Medication Refill    HPI Spencer Cardinal is a 32 y.o. male with history of schizophrenia is here for dose of Saphris5 mg. Patient and mother provide history. 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 In the last month patient was started on Saphris with good response, patient states that for the first time his hallucinations have improved. He ran out of his Saphris 3 days ago and since has started to notice hallucinations coming back again, making it difficult for him to ignore them. He went to an outside ED and received one dose of Saphris yesterday. His mother has [facility], previous psychologists nd multiple pharmacies to try and obtain the medication however providers have been unable to provide a refill prescription. Unfortunately medication costs up to $600 and patient unable to afford it.  He denies suicidal ideations, homicidal ideations, visual hallucinations. In regards to auditory hallucinations, he states they're not hallucinations but they are admixed of "thoughts". No command hallucinations.  HPI  Past Medical History:  Diagnosis Date  . 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  [name] affective schizophrenia [facility])     Patient Active Problem List   Diagnosis Date Noted  . Noncompliance 03/27/2016  . 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 Tobacco use disorder 10/27/2014    Past Surgical History:  Procedure Laterality Date  . NO PAST SURGERIES          Home Medications    Prior to Admission medications   Medication Sig Start Date End Date Taking? Authorizing Provider  asenapine (SAPHRIS) 5 MG SUBL 24 hr tablet Place 1  tablet (5 mg total) under the tongue 2 (two) times daily. 06/15/17 07/15/17  Liberty Handy, PA-C  chlorhexidine (PERIDEX) 0.12 % solution Use as directed 15 mLs in the mouth or throat 2 (two) times daily. 03/19/17   Fawze, Mina A, PA-C  ibuprofen (ADVIL,MOTRIN) 600 MG tablet Take 1 tablet (600 mg total) by mouth every 6 (six) hours as needed. 03/19/17   Fawze, Mina A, PA-C  risperiDONE (RISPERDAL) 3 MG tablet Take 1 tablet (3 mg total) by mouth 2 (two) times daily. 01/20/17   Pucilowska, Braulio Conte B, MD  traZODone (DESYREL) 100 MG tablet Take 1 tablet (100 mg total) by mouth at bedtime as needed for sleep. 01/20/17   Shari Prows, MD    Family History History reviewed. No pertinent family history.  Social History Social History   Tobacco Use  . Smoking status: Current Every Day Smoker    Packs/day: 1.00    Years: 10.00    Pack years: 10.00    Types: Cigarettes  . Smokeless tobacco: Never Used  Substance Use Topics  . Alcohol use: No    Comment: 4 to 5 beers a day every other week for sleep  . Drug use: No     Allergies   Haldol [haloperidol lactate]; Maalox [calcium carbonate antacid]; and Tylenol with codeine #3 [acetaminophen-codeine]   Review of Systems Review of Systems  Constitutional:       Need medication dose   Psychiatric/Behavioral: Positive for hallucinations.  All other systems reviewed and are negative.    Physical Exam Updated Vital Signs  BP 119/86 (BP Location: Right Arm)   Pulse 69   Temp 98 F (36.7 C) (Oral)   Resp 16   SpO2 94%   Physical Exam  Constitutional: He is oriented to person, place, and time. He appears well-developed and well-nourished.  Non-toxic appearance.  HENT:  Head: Normocephalic.  Right Ear: External ear normal.  Left Ear: External ear normal.  Nose: Nose normal.  Eyes: Conjunctivae and EOM are normal.  Neck: Full passive range of motion without pain.  Cardiovascular: Normal rate.  Pulmonary/Chest: Effort normal.  No tachypnea. No respiratory distress.  Musculoskeletal: Normal range of motion.  Neurological: He is alert and oriented to person, place, and time.  Skin: Skin is warm and dry. Capillary refill takes less than 2 seconds.  Psychiatric: His behavior is normal. Thought content normal.  Mood appears to be normal. Speech normal, non-tangential. No suicidal ideation, homicidal ideation. No active auditory sensations.     ED Treatments / Results  Labs (all labs ordered are listed, but only abnormal results are displayed) Labs Reviewed - No data to display  EKG None  Radiology No results found.  Procedures Procedures (including critical care time)  Medications Ordered in ED Medications  asenapine (SAPHRIS) sublingual tablet 5 mg (5 mg Sublingual Given 06/15/17 2110)     Initial Impression / Assessment and Plan / ED Course  I have reviewed the triage vital signs and the nursing notes.  Pertinent labs & imaging results that were available during my care of the patient were reviewed by me and considered in my medical decision making (see chart for details).  Clinical Course as of Jun 16 2210  Tue Jun 15, 2017  2028 [facility] to [facility] who will speak to patient    [CG]    Clinical Course User Index [CG] Liberty HandyGibbons, Waniya Hoglund J, PA-C   32 year old with long history of schizophrenia unable to obtain antipsychotic medication secondary to cost here for dose of Saphris. He was seen in outside ED and given 1 dose last night. Mother at bedside has been trying to obtain prescription refill and medication in the last 2-3 days. He denies active suicidal ideation, homicidal ideation, hallucinations. His mood and affect appear to be normal. He does not meet criteria for emergent psych evaluation her IVC. Will give dose of Saphris in the ED. Case management to speak to patient and mother.  Final Clinical Impressions(s) / ED Diagnoses   Burna MortimerWanda with CM able to help patient with coding for prescription  to be obtained at [facility].  Given adequate response to saphris and appt with psychologist tomorrow, feel it is appropriate to give pt a one month rx for saphris to bridge him until he is able to establish care with psychiatrist.   Final diagnoses:  Medication refill  Difficulty obtaining medication    ED Discharge Orders        Ordered    asenapine (SAPHRIS) 5 MG SUBL 24 hr tablet  2 times daily     06/15/17 2203       Liberty HandyGibbons, Stefon Ramthun J, PA-C 06/15/17 2212    Maia PlanLong, Joshua G, MD 06/16/17 1001

## 2017-06-15 NOTE — Discharge Instructions (Addendum)
As we discussed, the ED has a strict policy regarding renewal and dosing medications prescribed by a provider and for chronic medical conditions.  We have renewed your prescription today in hopes to bridge you over to your next appointment and refill by your previous provider.

## 2017-06-15 NOTE — ED Triage Notes (Signed)
Pt presents with auditory hallucinations for 5 years, has run out of medication that stabilize his mood for a week; [facility].  Pt denies any SI or HI, reports voices are "fearful".  Pt seen at [facility] last night, given saphris there, [place]

## 2017-06-17 ENCOUNTER — Encounter (HOSPITAL_COMMUNITY): Payer: Self-pay | Admitting: Family Medicine

## 2017-06-17 ENCOUNTER — Ambulatory Visit (HOSPITAL_COMMUNITY)
Admission: EM | Admit: 2017-06-17 | Discharge: 2017-06-17 | Disposition: A | Discharge status: Home / Self Care | Payer: Self-pay | Attending: Internal Medicine | Admitting: Internal Medicine

## 2017-06-17 DIAGNOSIS — L03032 Cellulitis of left toe: Secondary | ICD-10-CM

## 2017-06-17 MED ORDER — MUPIROCIN 2 % EX OINT: 1.0000 "application " | TOPICAL_OINTMENT | Freq: Two times a day (BID) | CUTANEOUS | 0 refills | Status: DC

## 2017-06-17 MED ORDER — CEPHALEXIN 500 MG PO CAPS: 500.0000 mg | ORAL_CAPSULE | Freq: Four times a day (QID) | ORAL | 0 refills | Status: AC

## 2017-06-17 NOTE — Discharge Instructions (Addendum)
Please begin Keflex 4 times a day/every 6 hours for the next week.  Please use Bactroban cream twice daily on toe.  Please monitor the redness, swelling, pain and numbness to the area.  Please return if symptoms are worsening or not improving with treatment.

## 2017-06-17 NOTE — ED Triage Notes (Signed)
Pt here for wound, redness and swelling to left great toe. This started after easter when he ripped off and ingrown toenail.

## 2017-06-18 NOTE — ED Provider Notes (Signed)
[facility]    CSN: [id number] Arrival date & time: 06/17/17  1551     History   Chief Complaint Chief Complaint  Patient presents with  . Toe Pain    HPI Shane Allen is a 32 y.o. male no contributing past medical history presenting today for evaluation of left great toe pain and redness.  States that on Sunday he hit his toe on something and ended up tripping his toenail off.  Since he has had significant redness and pain around his nail bed.  He is having mild numbness to dorsal aspect of great toe.   HPI  Past Medical History:  Diagnosis Date  . 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  [name] affective schizophrenia [facility])     Patient Active Problem List   Diagnosis Date Noted  . Noncompliance 03/27/2016  . 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 Tobacco use disorder 10/27/2014    Past Surgical History:  Procedure Laterality Date  . NO PAST SURGERIES         Home Medications    Prior to Admission medications   Medication Sig Start Date End Date Taking? Authorizing Provider  asenapine (SAPHRIS) 5 MG SUBL 24 hr tablet Place 1 tablet (5 mg total) under the tongue 2 (two) times daily. 06/15/17 07/15/17  Liberty Handy, PA-C  cephALEXin (KEFLEX) 500 MG capsule Take 1 capsule (500 mg total) by mouth 4 (four) times daily for 7 days. 06/17/17 06/24/17  Harrie Cazarez C, PA-C  chlorhexidine (PERIDEX) 0.12 % solution Use as directed 15 mLs in the mouth or throat 2 (two) times daily. 03/19/17   Fawze, Mina A, PA-C  ibuprofen (ADVIL,MOTRIN) 600 MG tablet Take 1 tablet (600 mg total) by mouth every 6 (six) hours as needed. 03/19/17   Fawze, Mina A, PA-C  mupirocin ointment (BACTROBAN) 2 % Apply 1 application topically 2 (two) times daily. 06/17/17   Emmer Lillibridge C, PA-C  risperiDONE (RISPERDAL) 3 MG tablet Take 1 tablet (3 mg total) by mouth 2 (two) times daily. 01/20/17   Pucilowska,  Braulio Conte B, MD  traZODone (DESYREL) 100 MG tablet Take 1 tablet (100 mg total) by mouth at bedtime as needed for sleep. 01/20/17   Shari Prows, MD    Family History History reviewed. No pertinent family history.  Social History Social History   Tobacco Use  . Smoking status: Current Every Day Smoker    Packs/day: 1.00    Years: 10.00    Pack years: 10.00    Types: Cigarettes  . Smokeless tobacco: Never Used  Substance Use Topics  . Alcohol use: No    Comment: 4 to 5 beers a day every other week for sleep  . Drug use: No     Allergies   Haldol [haloperidol lactate]; Maalox [calcium carbonate antacid]; and Tylenol with codeine #3 [acetaminophen-codeine]   Review of Systems Review of Systems  Constitutional: Negative for fatigue and fever.  Gastrointestinal: Negative for nausea and vomiting.  Musculoskeletal: Positive for arthralgias, joint swelling and myalgias. Negative for gait problem.  Skin: Positive for color change. Negative for wound.  Neurological: Positive for numbness. Negative for dizziness, weakness, light-headedness and headaches.       Physical Exam Triage Vital Signs ED Triage Vitals  Enc Vitals Group     BP 06/17/17 1619 136/82     Pulse Rate 06/17/17 1619 89     Resp --  Temp 06/17/17 1619 98.5 F (36.9 C)     Temp src --      SpO2 06/17/17 1619 98 %     Weight --      Height --      Head Circumference --      Peak Flow --      Pain Score 06/17/17 1618 5     Pain Loc --      Pain Edu? --      Excl. in GC? --    No data found.  Updated Vital Signs BP 136/82   Pulse 89   Temp 98.5 F (36.9 C)   SpO2 98%   Visual Acuity Right Eye Distance:   Left Eye Distance:   Bilateral Distance:    Right Eye Near:   Left Eye Near:    Bilateral Near:     Physical Exam  Constitutional: He appears well-developed and well-nourished.  HENT:  Head: Normocephalic and atraumatic.  Eyes: Conjunctivae are normal.  Neck: Neck supple.    Cardiovascular: Normal rate.  Pulmonary/Chest: Effort normal. No respiratory distress.  Musculoskeletal: He exhibits no edema.  Neurological: He is alert.  Skin: Skin is warm and dry. There is erythema.  Right toenail left great toe not present, proximal base still intact.  Significant erythema surrounding the toe with tenderness.  Psychiatric: He has a normal mood and affect.  Nursing note and vitals reviewed.      UC Treatments / Results  Labs (all labs ordered are listed, but only abnormal results are displayed) Labs Reviewed - No data to display  EKG None Radiology No results found.  Procedures Procedures (including critical care time)  Medications Ordered in UC Medications - No data to display   Initial Impression / Assessment and Plan / UC Course  I have reviewed the triage vital signs and the nursing notes.  Pertinent labs & imaging results that were available during my care of the patient were reviewed by me and considered in my medical decision making (see chart for details).     Patient symptoms concerning for paronychia. Neurovascularly intact. Will treat with Keflex and Bactroban ointment to the toe.  Will have patient return in 2 to 3 days if symptoms not improving with treatment initiated today. Discussed strict return precautions. Patient verbalized understanding and is agreeable with plan.   Final Clinical Impressions(s) / UC Diagnoses   Final diagnoses:  Paronychia of great toe, left    ED Discharge Orders        Ordered    cephALEXin (KEFLEX) 500 MG capsule  4 times daily     06/17/17 1654    mupirocin ointment (BACTROBAN) 2 %  2 times daily     06/17/17 1654       Controlled Substance Prescriptions [place] Controlled Substance Registry consulted? Not Applicable   Lew DawesWieters, Shandel Busic C, [place]-C 06/18/17 1458

## 2017-07-23 IMAGING — CT CT RENAL STONE PROTOCOL
2 of 4 series · 16 of 46 positions shown, 18 images · non-contrast
Comparison: None.

CLINICAL DATA: Left flank pain

EXAM:
CT ABDOMEN AND PELVIS WITHOUT CONTRAST
TECHNIQUE: Multidetector CT imaging of the abdomen and pelvis was performed
following the standard protocol without IV contrast.

[Series 2: stone study 5.0 i30f 1 · axial · 0.91mm/px · z∈[+490,+1016]mm · 13 of 115 slices shown, 15 images]
[im 5/115  soft-tissue]
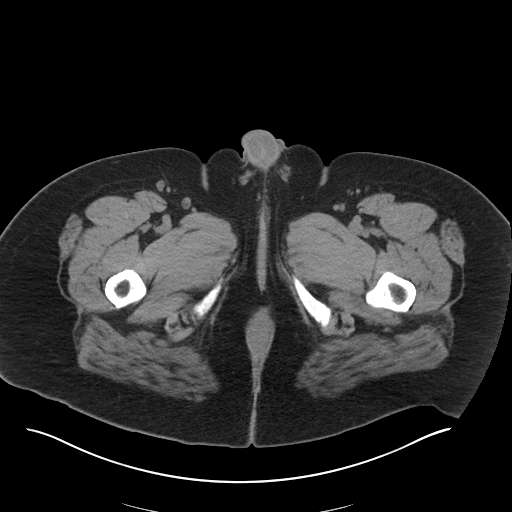
[im 5/115  bone]
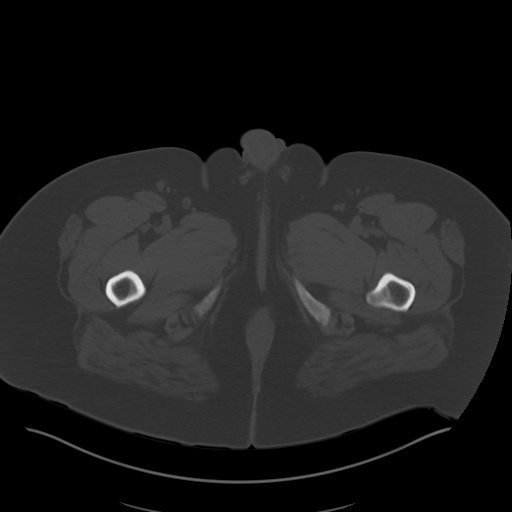
[im 15/115  soft-tissue]
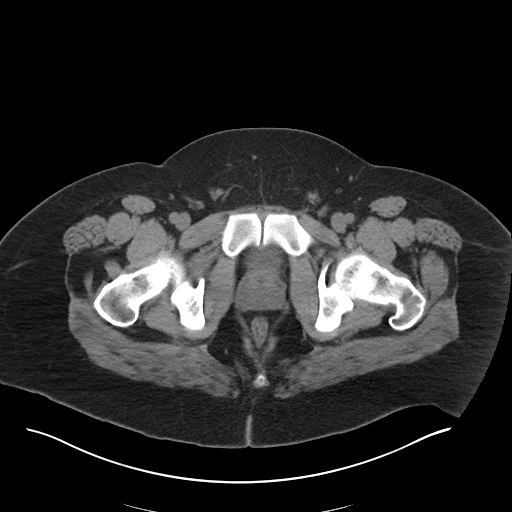
[im 25/115  soft-tissue]
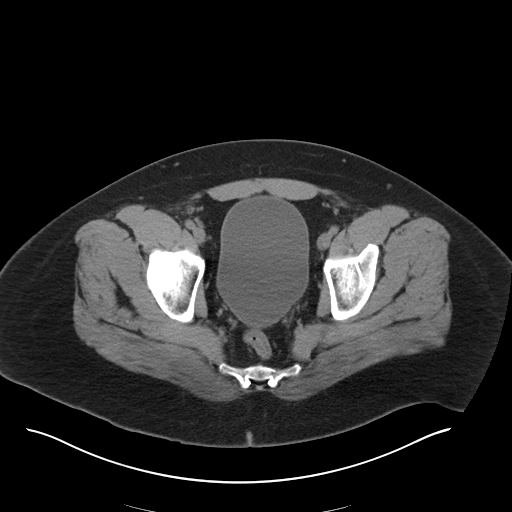
[im 30/115  soft-tissue]
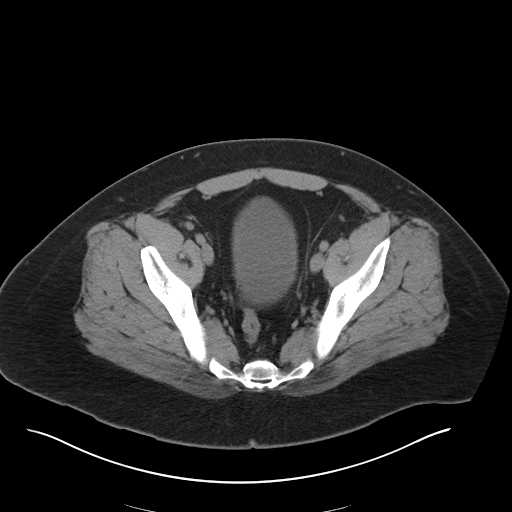
[im 40/115  soft-tissue]
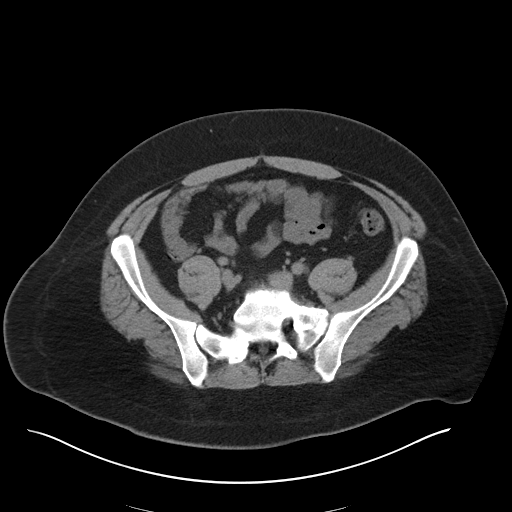
[im 50/115  soft-tissue]
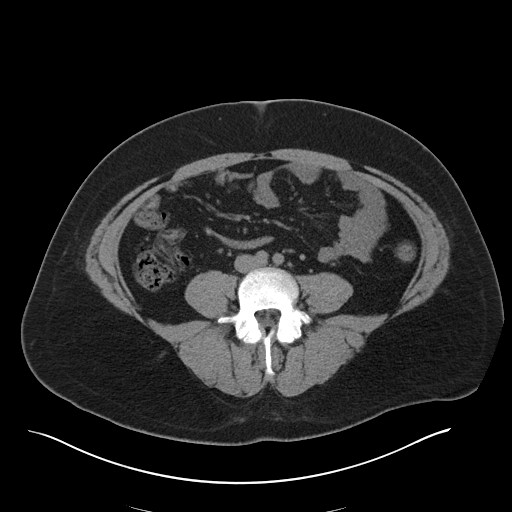
[im 60/115  soft-tissue]
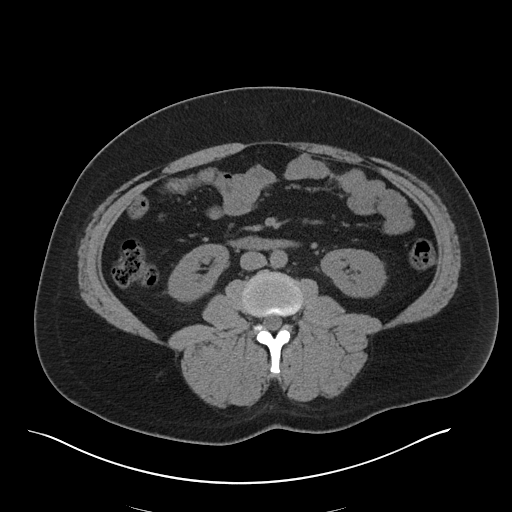
[im 65/115  soft-tissue]
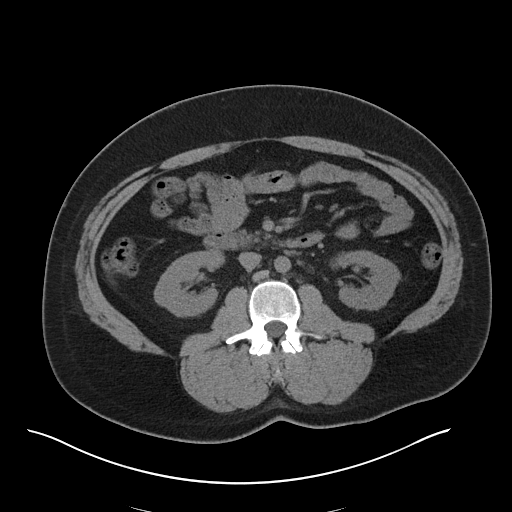
[im 75/115  soft-tissue]
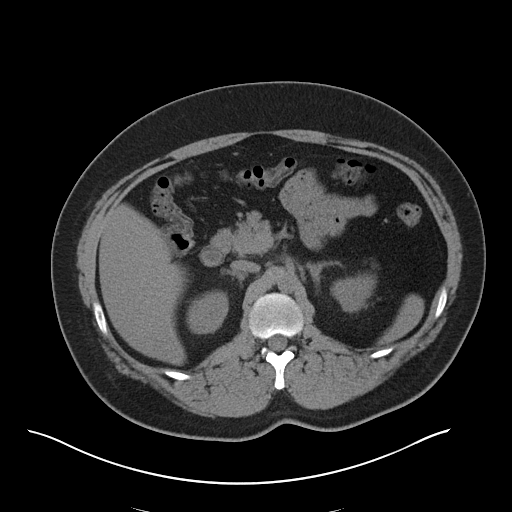
[im 75/115  bone]
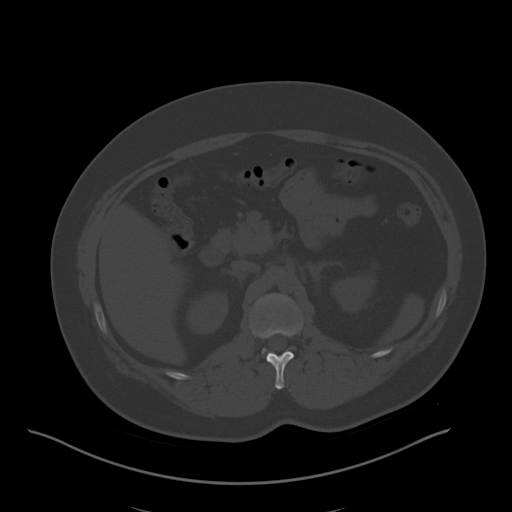
[im 85/115  soft-tissue]
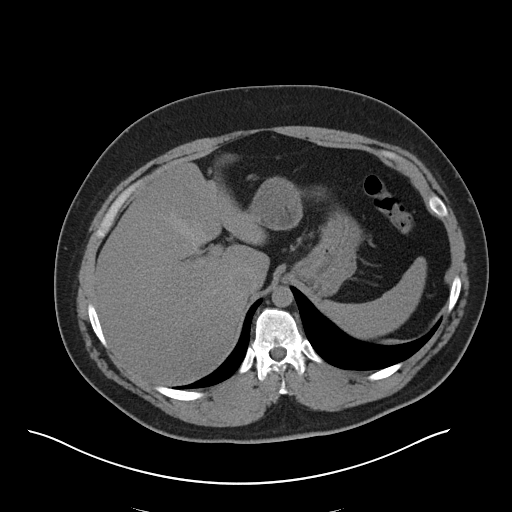
[im 90/115  soft-tissue]
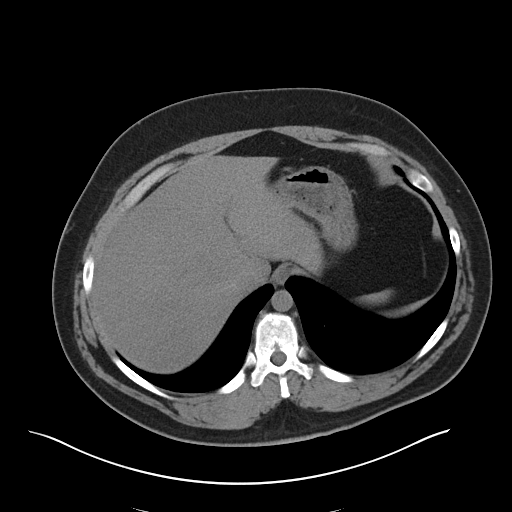
[im 100/115  soft-tissue]
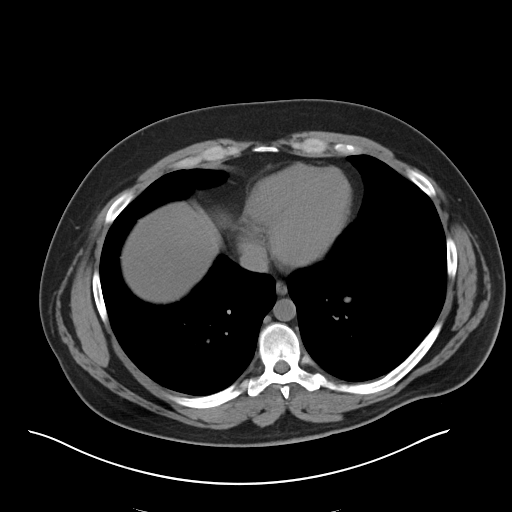
[im 110/115  soft-tissue]
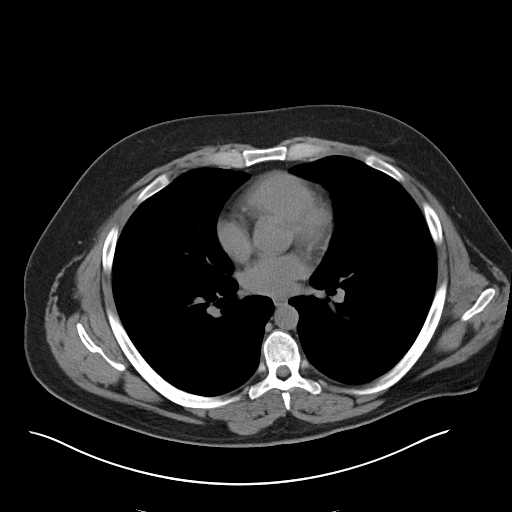

[Series 5: coronal soft tissue · coronal · 0.93mm/px · 3 of 101 slices shown]
[im 34/101  soft-tissue]
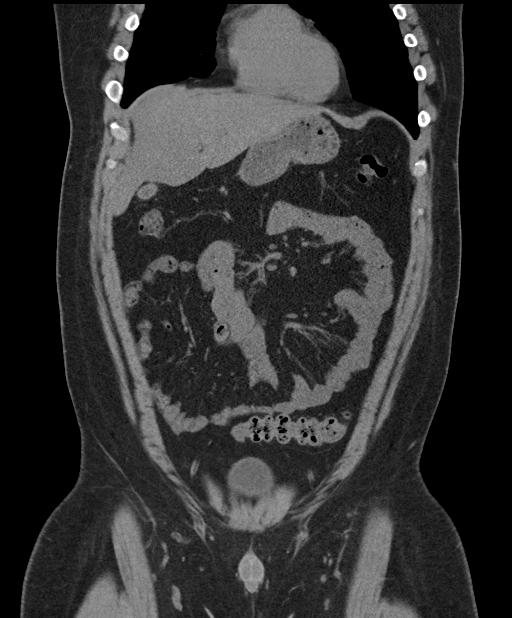
[im 45/101  soft-tissue]
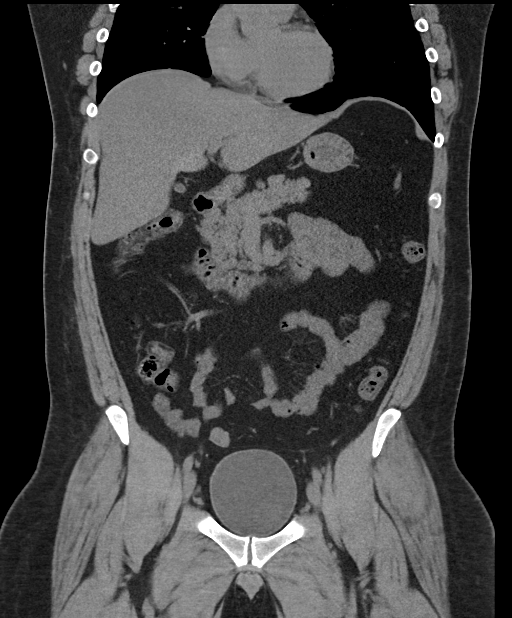
[im 56/101  soft-tissue]
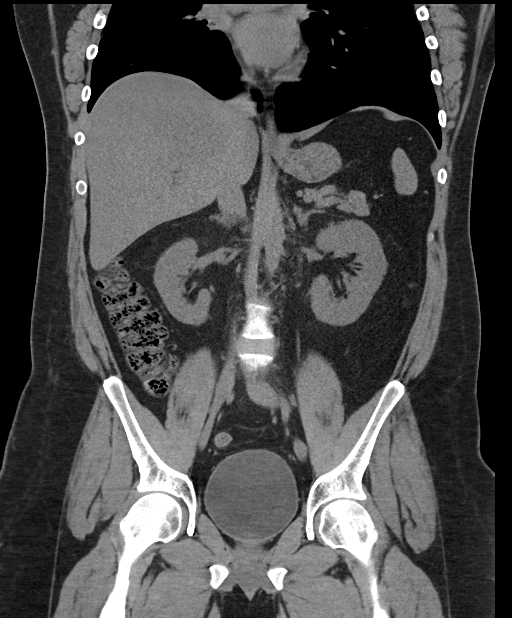

[16 of 46 positions shown; findings below may reference images not displayed]

FINDINGS: Lower chest: Lung bases are clear.

Hepatobiliary: No focal liver abnormality is seen. No gallstones,
gallbladder wall thickening, or biliary dilatation.

Pancreas: Unremarkable. No pancreatic ductal dilatation or
surrounding inflammatory changes.

Spleen: Normal in size without focal abnormality.

Adrenals/Urinary Tract: Adrenal glands are unremarkable. Kidneys are
normal, without renal calculi, focal lesion, or hydronephrosis.
Bladder is unremarkable.

Stomach/Bowel: Stomach is within normal limits. Appendix appears
normal. No evidence of bowel wall thickening, distention, or
inflammatory changes.

Vascular/Lymphatic: No significant vascular findings are present. No
enlarged abdominal or pelvic lymph nodes.

Reproductive:  Prostate gland is not enlarged.

Other: No abdominal wall hernia or abnormality. No abdominopelvic
ascites.

Musculoskeletal: Degenerative changes in the spine. No destructive
bone lesions.
IMPRESSION: No renal or ureteral stone or obstruction. No acute process
demonstrated in the abdomen or pelvis on noncontrast imaging.

## 2017-09-12 ENCOUNTER — Emergency Department (HOSPITAL_COMMUNITY)
Admission: EM | Admit: 2017-09-12 | Discharge: 2017-09-13 | Disposition: A | Discharge status: Home / Self Care | Payer: Medicaid Other | Attending: Emergency Medicine | Admitting: Emergency Medicine

## 2017-09-12 ENCOUNTER — Other Ambulatory Visit: Payer: Self-pay

## 2017-09-12 ENCOUNTER — Encounter (HOSPITAL_COMMUNITY): Payer: Self-pay | Admitting: Emergency Medicine

## 2017-09-12 ENCOUNTER — Emergency Department (HOSPITAL_COMMUNITY): Payer: Medicaid Other

## 2017-09-12 DIAGNOSIS — K600 Acute anal fissure: Secondary | ICD-10-CM | POA: Diagnosis not present

## 2017-09-12 DIAGNOSIS — K602 Anal fissure, unspecified: Secondary | ICD-10-CM

## 2017-09-12 DIAGNOSIS — K625 Hemorrhage of anus and rectum: Secondary | ICD-10-CM | POA: Insufficient documentation

## 2017-09-12 DIAGNOSIS — F17210 Nicotine dependence, cigarettes, uncomplicated: Secondary | ICD-10-CM | POA: Insufficient documentation

## 2017-09-12 DIAGNOSIS — R1030 Lower abdominal pain, unspecified: Secondary | ICD-10-CM | POA: Diagnosis not present

## 2017-09-12 DIAGNOSIS — Z79899 Other long term (current) drug therapy: Secondary | ICD-10-CM | POA: Diagnosis not present

## 2017-09-12 HISTORY — DX: Major depressive disorder, single episode, unspecified: F32.9

## 2017-09-12 HISTORY — DX: Depression, unspecified: F32.A

## 2017-09-12 LAB — COMPREHENSIVE METABOLIC PANEL
ALT: 35 U/L (ref 0–44)
AST: 25 U/L (ref 15–41)
Albumin: 4.2 g/dL (ref 3.5–5.0)
Alkaline Phosphatase: 70 U/L (ref 38–126)
Anion gap: 11 (ref 5–15)
BUN: 8 mg/dL (ref 6–20)
CHLORIDE: 103 mmol/L (ref 98–111)
CO2: 23 mmol/L (ref 22–32)
CREATININE: 1.05 mg/dL (ref 0.61–1.24)
Calcium: 9.7 mg/dL (ref 8.9–10.3)
GFR calc Af Amer: >60 mL/min (ref >60)
Glucose, Bld: 84 mg/dL (ref 70–99)
Potassium: 4 mmol/L (ref 3.5–5.1)
Sodium: 137 mmol/L (ref 135–145)
Total Bilirubin: 0.4 mg/dL (ref 0.3–1.2)
Total Protein: 7 g/dL (ref 6.5–8.1)

## 2017-09-12 LAB — LIPASE, BLOOD: Lipase: 30 U/L (ref 11–51)

## 2017-09-12 LAB — URINALYSIS, ROUTINE W REFLEX MICROSCOPIC
BILIRUBIN URINE: NEGATIVE
GLUCOSE, UA: NEGATIVE mg/dL
Hgb urine dipstick: NEGATIVE
KETONES UR: NEGATIVE mg/dL
LEUKOCYTES UA: NEGATIVE
Nitrite: NEGATIVE
PROTEIN: NEGATIVE mg/dL
Specific Gravity, Urine: 1.009 (ref 1.005–1.030)
pH: 5 (ref 5.0–8.0)

## 2017-09-12 LAB — CBC
HCT: 50.2 % (ref 39.0–52.0)
Hemoglobin: 16.8 g/dL (ref 13.0–17.0)
MCH: 31.6 pg (ref 26.0–34.0)
MCHC: 33.5 g/dL (ref 30.0–36.0)
MCV: 94.4 fL (ref 78.0–100.0)
PLATELETS: 316 10*3/uL (ref 150–400)
RBC: 5.32 MIL/uL (ref 4.22–5.81)
RDW: 12.3 % (ref 11.5–15.5)
WBC: 8.4 10*3/uL (ref 4.0–10.5)

## 2017-09-12 LAB — TYPE AND SCREEN
ABO/RH(D): O POS
Antibody Screen: NEGATIVE

## 2017-09-12 LAB — ABO/RH: ABO/RH(D): O POS

## 2017-09-12 LAB — POC OCCULT BLOOD, ED: Fecal Occult Bld: POSITIVE — AB

## 2017-09-12 NOTE — ED Notes (Signed)
Taken to xray at this time. 

## 2017-09-12 NOTE — ED Triage Notes (Signed)
C/o lower abd pain, generalized weakness, dizziness, diarrhea, and dark red rectal bleeding since yesterday.

## 2017-09-12 NOTE — ED Provider Notes (Signed)
[facility] Provider Note   CSN: [id number] Arrival date & time: 09/12/17  1903     History   Chief Complaint Chief Complaint  Patient presents with  . Abdominal Pain  . Rectal Bleeding    HPI Shane Allen is a 32 y.o. male with a hx of schizophrenia presents to the Emergency Department complaining of gradual, persistent, progressively worsening rectal bleeding onset 5 years ago.  Pt reports this started when he began treatment for his mental illness.  Pt reports the blood is dark red and thick.  He reports intermittent hard and painful bowel movements.  Last BM today with "a lot of blood."   Pt reports LLQ abd pain, intermittent, cramping and sharp.  Associated symptoms include general weakness.  Pt reports he stopped his Safris and started risperidone.  He reports intermittent NSAID usage for chronic back pain.  Pt denies EtOH and illicit drug use.  He is a current smoker.  Pt is not anticoagulated. No other aggravating or alleviating factors.  Pt denies fever, chills, headache, neck pain, chest pain, vomiting, diarrhea, dizziness, syncope. No abd surgeries.       The history is provided by the patient. No language interpreter was used.    Past Medical History:  Diagnosis Date  . Depression   . 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  [name]. Schizo affective schizophrenia [facility](HCC)     Patient Active Problem List   Diagnosis Date Noted  . Noncompliance 03/27/2016  . 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 Tobacco use disorder 10/27/2014    Past Surgical History:  Procedure Laterality Date  . NO PAST SURGERIES          Home Medications    Prior to Admission medications   Medication Sig Start Date End Date Taking? Authorizing Provider  asenapine (SAPHRIS) 5 MG SUBL 24 hr tablet Place 1 tablet (5 mg total) under the tongue 2 (two) times daily. 06/15/17 07/15/17   Liberty HandyGibbons, Claudia J, PA-C  chlorhexidine (PERIDEX) 0.12 % solution Use as directed 15 mLs in the mouth or throat 2 (two) times daily. 03/19/17   Luevenia MaxinFawze, Mina A, PA-C  hydrocortisone (ANUSOL-HC) 2.5 % rectal cream Apply rectally 2 times daily 09/13/17   Emmie Frakes, Dahlia ClientHannah, PA-C  ibuprofen (ADVIL,MOTRIN) 600 MG tablet Take 1 tablet (600 mg total) by mouth every 6 (six) hours as needed. 03/19/17   Fawze, Mina A, PA-C  mupirocin ointment (BACTROBAN) 2 % Apply 1 application topically 2 (two) times daily. 06/17/17   Wieters, Hallie C, PA-C  polyethylene glycol powder (GLYCOLAX/MIRALAX) powder Take 17 g by mouth 2 (two) times daily. 09/13/17   Refujio Haymer, Dahlia ClientHannah, PA-C  risperiDONE (RISPERDAL) 3 MG tablet Take 1 tablet (3 mg total) by mouth 2 (two) times daily. 01/20/17   Pucilowska, Braulio ConteJolanta B, MD  traZODone (DESYREL) 100 MG tablet Take 1 tablet (100 mg total) by mouth at bedtime as needed for sleep. 01/20/17   Shari ProwsPucilowska, Jolanta B, MD    Family History No family history on file.  Social History Social History   Tobacco Use  . Smoking status: Current Every Day Smoker    Packs/day: 1.00    Years: 10.00    Pack years: 10.00    Types: Cigarettes  . Smokeless tobacco: Never Used  Substance Use Topics  . Alcohol use: No    Comment: 4 to 5 beers a day every other week for sleep  .  Drug use: No     Allergies   Haldol [haloperidol lactate]; Maalox [calcium carbonate antacid]; and Tylenol with codeine #3 [acetaminophen-codeine]   Review of Systems Review of Systems  Constitutional: Negative for appetite change, diaphoresis, fatigue, fever and unexpected weight change.  HENT: Negative for mouth sores.   Eyes: Negative for visual disturbance.  Respiratory: Negative for cough, chest tightness, shortness of breath and wheezing.   Cardiovascular: Negative for chest pain.  Gastrointestinal: Positive for abdominal pain and anal bleeding. Negative for constipation, diarrhea, nausea and vomiting.    Endocrine: Negative for polydipsia, polyphagia and polyuria.  Genitourinary: Negative for dysuria, frequency, hematuria and urgency.  Musculoskeletal: Negative for back pain and neck stiffness.  Skin: Negative for rash.  Allergic/Immunologic: Negative for immunocompromised state.  Neurological: Negative for syncope, light-headedness and headaches.  Hematological: Does not bruise/bleed easily.  Psychiatric/Behavioral: Negative for sleep disturbance. The patient is not nervous/anxious.      Physical Exam Updated Vital Signs BP 112/80 (BP Location: Right Arm)   Pulse 88   Temp 98.2 F (36.8 C) (Oral)   Resp 16   SpO2 98%   Physical Exam  Constitutional: He appears well-developed and well-nourished. No distress.  Awake, alert, nontoxic appearance  HENT:  Head: Normocephalic and atraumatic.  Mouth/Throat: Oropharynx is clear and moist. No oropharyngeal exudate.  Eyes: Conjunctivae are normal. No scleral icterus.  Neck: Normal range of motion. Neck supple.  Cardiovascular: Normal rate, regular rhythm and intact distal pulses.  Pulmonary/Chest: Effort normal and breath sounds normal. No respiratory distress. He has no wheezes.  Equal chest expansion  Abdominal: Soft. Bowel sounds are normal. He exhibits no mass. There is tenderness in the suprapubic area and left lower quadrant. There is no rigidity, no rebound, no guarding and no CVA tenderness.  Genitourinary: Prostate normal. Rectal exam shows fissure and tenderness ( at fissure site). Rectal exam shows no external hemorrhoid, no internal hemorrhoid, no mass and anal tone normal.  Genitourinary Comments: Chaperone present.  Brown stool on DRE.  Small anal fissure noted at the 6 o'clock position.  No internal or external hemorrhoids visible or palpated.  Musculoskeletal: Normal range of motion. He exhibits no edema.  Neurological: He is alert.  Speech is clear and goal oriented Moves extremities without ataxia  Skin: Skin is warm  and dry. He is not diaphoretic.  Psychiatric: He has a normal mood and affect.  Nursing note and vitals reviewed.    ED Treatments / Results  Labs (all labs ordered are listed, but only abnormal results are displayed) Labs Reviewed  POC OCCULT BLOOD, ED - Abnormal; Notable for the following components:      Result Value   Fecal Occult Bld POSITIVE (*)    All other components within normal limits  LIPASE, BLOOD  COMPREHENSIVE METABOLIC PANEL  CBC  URINALYSIS, ROUTINE W REFLEX MICROSCOPIC  TYPE AND SCREEN  ABO/RH    EKG EKG Interpretation  Date/Time:  Sunday September 12 2017 19:21:43 EDT Ventricular Rate:  74 PR Interval:  140 QRS Duration: 92 QT Interval:  352 QTC Calculation: 390 R Axis:   83 Text Interpretation:  Normal sinus rhythm with sinus arrhythmia T wave abnormality, consider inferior ischemia Abnormal ECG t waves flipped laterally downsloping likely lvh, not seen on prior Otherwise no significant change Confirmed by Melene Plan [phone]) on 09/12/2017 9:49:22 PM   Radiology Dg Abd Acute W/chest  Result Date: 09/12/2017 CLINICAL DATA:  C/o lower abdominal pain, generalized weakness, dizziness, diarrhea, and dark red rectal  bleeding since yesterday. EXAM: DG ABDOMEN ACUTE W/ 1V CHEST COMPARISON:  CT abdomen and pelvis 04/09/2016.  Chest 11/17/2014 FINDINGS: Normal heart size and pulmonary vascularity. No focal airspace disease or consolidation in the lungs. No blunting of costophrenic angles. No pneumothorax. Mediastinal contours appear intact. Scattered gas and stool in the colon. No small or large bowel distention. No free intra-abdominal air. No abnormal air-fluid levels. No radiopaque stones. Visualized bones appear intact. IMPRESSION: No evidence of active pulmonary disease. Normal nonobstructive bowel gas pattern. Electronically Signed   By: Burman Nieves M.D.   On: 09/12/2017 23:59    Procedures Procedures (including critical care time)  Medications Ordered in  ED Medications  dicyclomine (BENTYL) capsule 10 mg (10 mg Oral Given 09/13/17 0044)     Initial Impression / Assessment and Plan / ED Course  I have reviewed the triage vital signs and the nursing notes.  Pertinent labs & imaging results that were available during my care of the patient were reviewed by me and considered in my medical decision making (see chart for details).     Patient presents to the emergency department with complaints of 5 years of rectal bleeding.  He reports history of hard stools and pain with bowel movements.  Patient now with some left lower quadrant abdominal pain and cramping.  On exam he is without rebound or guarding.  Minimal tenderness in the suprapubic and left lower quadrant region.  DRE with brown stool and anal fissure.  Labs are within normal limits.  No anemia.  No leukocytosis.  Patient is afebrile without tachycardia.  No evidence of sirs or sepsis.  Highly doubt diverticulitis at this time.  Fecal occult is positive but I suspect this is secondary to bleeding from the anal fissure.  Plain films without evidence of bowel obstruction or free air.  Patient does have a moderate amount of stool.  Personally evaluated these images.  Will give MiraLAX and Anusol.  Patient is to follow-up with gastroenterology.  Suggested that patient continue to take his psychiatric medications.  Patient states understanding and is in agreement with the plan.  Final Clinical Impressions(s) / ED Diagnoses   Final diagnoses:  Rectal bleeding  Lower abdominal pain  Anal fissure    ED Discharge Orders        Ordered    polyethylene glycol powder (GLYCOLAX/MIRALAX) powder  2 times daily     09/13/17 0058    hydrocortisone (ANUSOL-HC) 2.5 % rectal cream     09/13/17 0058       Trig Mcbryar, Dahlia Client, PA-C 09/13/17 0113    Melene Plan, DO 09/13/17 1047

## 2017-09-12 NOTE — ED Notes (Signed)
Called for pt, family stated he went to car, he should return shortly.  Pt. Will let tech first know when he returns.

## 2017-09-13 MED ORDER — HYDROCORTISONE 2.5 % RE CREA: TOPICAL_CREAM | RECTAL | 0 refills | Status: DC

## 2017-09-13 MED ORDER — DICYCLOMINE HCL 10 MG PO CAPS
10.0000 mg | ORAL_CAPSULE | Freq: Once | ORAL | Status: AC
  Administered 2017-09-13: 10 mg via ORAL
  Filled 2017-09-13: qty 1

## 2017-09-13 MED ORDER — POLYETHYLENE GLYCOL 3350 17 GM/SCOOP PO POWD: 17.0000 g | Freq: Two times a day (BID) | ORAL | 0 refills | Status: DC

## 2017-09-13 NOTE — Discharge Instructions (Signed)
1. Medications: anusol (for the anal fissure), Miralax (for constipation), usual home medications 2. Treatment: rest, drink plenty of fluids,  3. Follow Up: Please followup with your primary doctor in 1-2 days and gastroenterology in 1 week for discussion of your diagnoses and further evaluation after today's visit; if you do not have a primary care doctor use the resource guide provided to find one; Please return to the ER for increased bleeding, worsening pain, fevers or other concerns

## 2018-04-28 ENCOUNTER — Other Ambulatory Visit: Payer: Self-pay

## 2018-04-28 ENCOUNTER — Emergency Department (HOSPITAL_COMMUNITY)
Admission: EM | Admit: 2018-04-28 | Discharge: 2018-04-28 | Disposition: A | Discharge status: Home / Self Care | Payer: Medicaid Other | Attending: Emergency Medicine | Admitting: Emergency Medicine

## 2018-04-28 ENCOUNTER — Encounter (HOSPITAL_COMMUNITY): Payer: Self-pay | Admitting: Emergency Medicine

## 2018-04-28 DIAGNOSIS — Z79899 Other long term (current) drug therapy: Secondary | ICD-10-CM | POA: Diagnosis not present

## 2018-04-28 DIAGNOSIS — F17210 Nicotine dependence, cigarettes, uncomplicated: Secondary | ICD-10-CM | POA: Diagnosis not present

## 2018-04-28 DIAGNOSIS — R443 Hallucinations, unspecified: Secondary | ICD-10-CM

## 2018-04-28 DIAGNOSIS — R442 Other hallucinations: Secondary | ICD-10-CM | POA: Insufficient documentation

## 2018-04-28 DIAGNOSIS — F209 Schizophrenia, unspecified: Secondary | ICD-10-CM | POA: Diagnosis not present

## 2018-04-28 LAB — COMPREHENSIVE METABOLIC PANEL
ALT: 43 U/L (ref 0–44)
AST: 26 U/L (ref 15–41)
Albumin: 4.3 g/dL (ref 3.5–5.0)
Alkaline Phosphatase: 73 U/L (ref 38–126)
Anion gap: 12 (ref 5–15)
BUN: 10 mg/dL (ref 6–20)
CHLORIDE: 103 mmol/L (ref 98–111)
CO2: 22 mmol/L (ref 22–32)
CREATININE: 1.14 mg/dL (ref 0.61–1.24)
Calcium: 9.4 mg/dL (ref 8.9–10.3)
Glucose, Bld: 109 mg/dL — ABNORMAL HIGH (ref 70–99)
Potassium: 3.6 mmol/L (ref 3.5–5.1)
Sodium: 137 mmol/L (ref 135–145)
Total Bilirubin: 0.3 mg/dL (ref 0.3–1.2)
Total Protein: 7.5 g/dL (ref 6.5–8.1)

## 2018-04-28 LAB — CBC
HCT: 48.5 % (ref 39.0–52.0)
HEMOGLOBIN: 16 g/dL (ref 13.0–17.0)
MCH: 31 pg (ref 26.0–34.0)
MCHC: 33 g/dL (ref 30.0–36.0)
MCV: 94 fL (ref 80.0–100.0)
Platelets: 314 10*3/uL (ref 150–400)
RBC: 5.16 MIL/uL (ref 4.22–5.81)
RDW: 12.3 % (ref 11.5–15.5)
WBC: 8.9 10*3/uL (ref 4.0–10.5)
nRBC: 0 % (ref 0.0–0.2)

## 2018-04-28 LAB — RAPID URINE DRUG SCREEN, HOSP PERFORMED
Amphetamines: NOT DETECTED
Barbiturates: NOT DETECTED
Benzodiazepines: NOT DETECTED
COCAINE: NOT DETECTED
OPIATES: NOT DETECTED
TETRAHYDROCANNABINOL: NOT DETECTED

## 2018-04-28 LAB — ETHANOL

## 2018-04-28 MED ORDER — CHLORHEXIDINE GLUCONATE 0.12 % MT SOLN
15.0000 mL | Freq: Two times a day (BID) | OROMUCOSAL | Status: DC
  Administered 2018-04-28: 15 mL via OROMUCOSAL
  Filled 2018-04-28 (×2): qty 15

## 2018-04-28 MED ORDER — IBUPROFEN 400 MG PO TABS: 600.0000 mg | ORAL_TABLET | Freq: Four times a day (QID) | ORAL | Status: DC | PRN

## 2018-04-28 MED ORDER — MUPIROCIN 2 % EX OINT
1.0000 "application " | TOPICAL_OINTMENT | Freq: Two times a day (BID) | CUTANEOUS | Status: DC
  Administered 2018-04-28: 1 via TOPICAL
  Filled 2018-04-28: qty 22

## 2018-04-28 MED ORDER — RISPERIDONE 3 MG PO TABS
3.0000 mg | ORAL_TABLET | Freq: Two times a day (BID) | ORAL | Status: DC
  Administered 2018-04-28: 3 mg via ORAL
  Filled 2018-04-28: qty 1

## 2018-04-28 MED ORDER — ASENAPINE MALEATE 5 MG SL SUBL
5.0000 mg | SUBLINGUAL_TABLET | Freq: Two times a day (BID) | SUBLINGUAL | Status: DC
  Administered 2018-04-28: 5 mg via SUBLINGUAL
  Filled 2018-04-28 (×2): qty 1

## 2018-04-28 MED ORDER — HYDROCORTISONE 2.5 % RE CREA
TOPICAL_CREAM | Freq: Two times a day (BID) | RECTAL | Status: DC
  Administered 2018-04-28: 10:00:00 via RECTAL
  Filled 2018-04-28: qty 28.35

## 2018-04-28 MED ORDER — POLYETHYLENE GLYCOL 3350 17 G PO PACK: 17.0000 g | PACK | Freq: Two times a day (BID) | ORAL | Status: DC

## 2018-04-28 MED ORDER — TRAZODONE HCL 100 MG PO TABS: 100.0000 mg | ORAL_TABLET | Freq: Every evening | ORAL | Status: DC | PRN

## 2018-04-28 NOTE — ED Notes (Signed)
Pt is denying auditory or visual hallucinations at this time.

## 2018-04-28 NOTE — ED Notes (Signed)
Donell Sievert, PA, recommends overnight observation due to lack of collateral information. Patient to be re assessed after collateral contact has been made to ensure safety of patient. Megan, RN, informed of disposition.

## 2018-04-28 NOTE — ED Notes (Signed)
TTS bedside at this time

## 2018-04-28 NOTE — Consult Note (Signed)
  Tele Assessment   Shane Allen, 33 y.o., male patient presented to patient presents to [facility] with complaints of auditory/visual hallucinations and a history of schizoaffective Schizophrenia.. Reported to TTS that seeing demons is 24/7 and that they are yelling at him "they will come and get me" which is making him scared.  patient was to be observed overnight until collateral information was obtained prior to disposition.  Patient seen via telepsych by this provider; chart reviewed and consulted with Dr. Lucianne Muss on 04/28/18.  On evaluation Shane Allen reports that he came to the hospital because " I was hearing voices.  It was just a bunch of nonsense stuff.  Saying stuff like I am gonna get you and blasphemous stuff towards God."  Patient also states that he is seeing " eyeballs, orange for, and just read thickness."  Patient states that he was doing fine until he stopped his medication about 5 days ago.  Reports about the fifth day the visions and the voices got worse.  Patient reports he has been hearing voices and seeing things for 5 to 6 years.  Reports he has been diagnosed with schizophrenia.  States he stopped his medication " because I thought I could get through it without taking my medicine but I couldn't."  Patient reports that he takes Resporal 2 mg at bedtime.  States that he goes to [facility] for outpatient psychiatric services last visit was 1 month ago and he had need to call to set up another appointment.  Patient reported he lives at home with his mother; he is on disability.  Patient denies suicidal/self-harm/homicidal ideations and paranoia.  Patient denies prior history of suicide attempt and states his last psychiatric hospitalization was in 2018.  Patient states that he still has medication at home and he would be fine going home to restart his medication.  Patient reports that he is having no problems with eating or sleeping.  Patient gave permission to speak to his mother for  collateral information.  TTS have attempted to contact his mother for collateral information but was unable to reach anyone.  They will continue trying to contact patient's mother.  During evaluation Shane Allen is lying in bed; he is alert/oriented x 4; calm/cooperative; and mood congruent with affect.  Patient is speaking in a clear tone at moderate volume, and normal pace; with good eye contact.  His thought process is coherent and relevant; There is no indication that he is currently responding to internal/external stimuli or experiencing delusional thought content at this time; the patient reports that he continues to have auditory/visual hallucinations..  Patient denies suicidal/self-harm/homicidal ideation, and paranoia.  Patient has remained calm throughout assessment and has answered questions appropriately.  Patient reports he feels comfortable going home and restarting his medications.  Informed patient that would have EDP to give him 1 dose since he missed his dose last night.    Recommendations: Patient psychiatrically cleared after collateral information with mother of information correlates with the information patient has given.  Disposition: Patient psychiatrically cleared No evidence of imminent risk to self or others at present.   Patient does not meet criteria for psychiatric inpatient admission. Supportive therapy provided about ongoing stressors. Discussed crisis plan, support from social network, calling 911, coming to the Emergency Department, and calling Suicide Hotline.   Spoke with Dr. Rush Landmark; informed of above recommendation and disposition   Assunta Found, NP

## 2018-04-28 NOTE — Progress Notes (Signed)
CSW spoke with pt's mother, Shane Allen [phone]). She verified the information pt has provided as correct and voiced understanding that pt is psychiatrically cleared. She shared that she is on her way to the hospital to pick pt up. [name] requests a list of resources of agencies that can assist pt, including psychiatrists. CSW will fax resources to [facility] ED for pt and his mother to review. [facility] ED RN notified.   Wells Guiles, LCSW, LCAS Disposition CSW [facility]/TTS [phone]

## 2018-04-28 NOTE — ED Triage Notes (Signed)
Pt reports auditory and visual hallucinations. Denies SI/HI.

## 2018-04-28 NOTE — ED Notes (Signed)
Lunch ordered 

## 2018-04-28 NOTE — ED Provider Notes (Signed)
[facility] Provider Note   CSN: [id number] Arrival date & time: 04/28/18  0220    History   Chief Complaint Chief Complaint  Patient presents with  . Hallucinations    HPI Shane Allen is a 33 y.o. male with history of schizoaffective schizophrenia who presents with auditory and visual hallucinations.  Patient reports that started 5 days ago.  Patient stopped his medication because he does not like the way they make him feel.  He reports taking Risperdal earlier today.  He has had auditory hallucinations that are making him very scared.  He denies any SI or HI.  [facility] and [place], however he would like options for others because they will not try any new/different medications even though he has been asking them to.  Patient reports he has had intermittent pain in his abdomen for the past 5 days.  He has not been eating much over the past 5 days.  He describes the pain is in his intestines and generalized.  He denies any nausea, vomiting, diarrhea, chest pain, shortness of breath  Peripheral information given by mother at the bedside who lives with the patient and his caretaker.     HPI  Past Medical History:  Diagnosis Date  . Depression   . 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  [name]. Schizo affective schizophrenia [facility](HCC)     Patient Active Problem List   Diagnosis Date Noted  . Noncompliance 03/27/2016  . 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 Tobacco use disorder 10/27/2014    Past Surgical History:  Procedure Laterality Date  . NO PAST SURGERIES          Home Medications    Prior to Admission medications   Medication Sig Start Date End Date Taking? Authorizing Provider  asenapine (SAPHRIS) 5 MG SUBL 24 hr tablet Place 1 tablet (5 mg total) under the tongue 2 (two) times daily. 06/15/17 04/28/27 Yes Liberty HandyGibbons,  Claudia J, PA-C  chlorhexidine (PERIDEX) 0.12 % solution Use as directed 15 mLs in the mouth or throat 2 (two) times daily. 03/19/17  Yes Fawze, Mina A, PA-C  hydrocortisone (ANUSOL-HC) 2.5 % rectal cream Apply rectally 2 times daily 09/13/17  Yes Muthersbaugh, Dahlia ClientHannah, PA-C  ibuprofen (ADVIL,MOTRIN) 600 MG tablet Take 1 tablet (600 mg total) by mouth every 6 (six) hours as needed. Patient taking differently: Take 600 mg by mouth every 6 (six) hours as needed for mild pain.  03/19/17  Yes Fawze, Mina A, PA-C  mupirocin ointment (BACTROBAN) 2 % Apply 1 application topically 2 (two) times daily. 06/17/17  Yes Wieters, Hallie C, PA-C  polyethylene glycol powder (GLYCOLAX/MIRALAX) powder Take 17 g by mouth 2 (two) times daily. 09/13/17  Yes Muthersbaugh, Dahlia ClientHannah, PA-C  risperiDONE (RISPERDAL) 3 MG tablet Take 1 tablet (3 mg total) by mouth 2 (two) times daily. 01/20/17  Yes Pucilowska, Jolanta B, MD  traZODone (DESYREL) 100 MG tablet Take 1 tablet (100 mg total) by mouth at bedtime as needed for sleep. 01/20/17  Yes Pucilowska, Ellin GoodieJolanta B, MD    Family History No family history on file.  Social History Social History   Tobacco Use  . Smoking status: Current Every Day Smoker    Packs/day: 1.00    Years: 10.00    Pack years: 10.00    Types: Cigarettes  . Smokeless tobacco: Never Used  Substance Use Topics  .  Alcohol use: No    Comment: 4 to 5 beers a day every other week for sleep  . Drug use: No     Allergies   Haldol [haloperidol lactate]; Maalox [calcium carbonate antacid]; and Tylenol with codeine #3 [acetaminophen-codeine]   Review of Systems Review of Systems  Constitutional: Negative for chills and fever.  HENT: Negative for facial swelling and sore throat.   Respiratory: Negative for shortness of breath.   Cardiovascular: Negative for chest pain.  Gastrointestinal: Negative for abdominal pain, nausea and vomiting.  Genitourinary: Negative for dysuria.  Musculoskeletal: Negative  for back pain.  Skin: Negative for rash and wound.  Neurological: Negative for headaches.  Psychiatric/Behavioral: Positive for hallucinations. Negative for suicidal ideas. The patient is not nervous/anxious.      Physical Exam Updated Vital Signs BP (!) 124/93   Pulse 92   Temp (!) 97.4 F (36.3 C) (Oral)   Resp 16   SpO2 94%   Physical Exam Vitals signs and nursing note reviewed.  Constitutional:      General: He is not in acute distress.    Appearance: He is well-developed. He is not diaphoretic.  HENT:     Head: Normocephalic and atraumatic.     Mouth/Throat:     Pharynx: No oropharyngeal exudate.  Eyes:     General: No scleral icterus.       Right eye: No discharge.        Left eye: No discharge.     Conjunctiva/sclera: Conjunctivae normal.     Pupils: Pupils are equal, round, and reactive to light.  Neck:     Musculoskeletal: Normal range of motion and neck supple.     Thyroid: No thyromegaly.  Cardiovascular:     Rate and Rhythm: Normal rate and regular rhythm.     Heart sounds: Normal heart sounds. No murmur. No friction rub. No gallop.   Pulmonary:     Effort: Pulmonary effort is normal. No respiratory distress.     Breath sounds: Normal breath sounds. No stridor. No wheezing or rales.  Abdominal:     General: Bowel sounds are normal. There is no distension.     Palpations: Abdomen is soft.     Tenderness: There is generalized abdominal tenderness (mild, if any). There is no guarding or rebound.  Lymphadenopathy:     Cervical: No cervical adenopathy.  Skin:    General: Skin is warm and dry.     Coloration: Skin is not pale.     Findings: No rash.  Neurological:     Mental Status: He is alert.     Coordination: Coordination normal.  Psychiatric:        Mood and Affect: Mood is depressed. Affect is blunt.        Behavior: Behavior is withdrawn.        Thought Content: Thought content does not include homicidal or suicidal ideation.     Comments: Patient  lying on the stretcher with eyes closed, gives mostly one-word answers      ED Treatments / Results  Labs (all labs ordered are listed, but only abnormal results are displayed) Labs Reviewed  COMPREHENSIVE METABOLIC PANEL - Abnormal; Notable for the following components:      Result Value   Glucose, Bld 109 (*)    All other components within normal limits  ETHANOL  CBC  RAPID URINE DRUG SCREEN, HOSP PERFORMED    EKG None  Radiology No results found.  Procedures Procedures (including critical care time)  Medications Ordered in ED Medications  asenapine (SAPHRIS) sublingual tablet 5 mg (has no administration in time range)  ibuprofen (ADVIL,MOTRIN) tablet 600 mg (has no administration in time range)  polyethylene glycol powder (GLYCOLAX/MIRALAX) container 17 g (has no administration in time range)  mupirocin ointment (BACTROBAN) 2 % 1 application (has no administration in time range)  hydrocortisone (ANUSOL-HC) 2.5 % rectal cream (has no administration in time range)  risperiDONE (RISPERDAL) tablet 3 mg (has no administration in time range)  traZODone (DESYREL) tablet 100 mg (has no administration in time range)  chlorhexidine (PERIDEX) 0.12 % solution 15 mL (has no administration in time range)     Initial Impression / Assessment and Plan / ED Course  I have reviewed the triage vital signs and the nursing notes.  Pertinent labs & imaging results that were available during my care of the patient were reviewed by me and considered in my medical decision making (see chart for details).        Patient presenting with hallucinations.  He denies SI or HI.  Labs are unremarkable.  Patient reports abdominal pain for the past 5 days.  It is been intermittent.  He has not eaten for the past 5 days and has been off his normal medications.  Suspect is the reason.  There is no abdominal tenderness on exam.  No indication for imaging at this time.  Patient is medically cleared.  TTS  recommends inpatient admission.  They will seek placement.  Patient and mother understand and agree with plan.  Mother's number [phone] Shirlean Mylar); she would like to be kept updated with placement.  Final Clinical Impressions(s) / ED Diagnoses   Final diagnoses:  Hallucinations    ED Discharge Orders    None       Emi Holes, PA-C 04/28/18 0549    Dione Booze, MD 04/28/18 [phone]

## 2018-04-28 NOTE — ED Notes (Addendum)
Pt states he has not had any hallucinations (auditory; "demons yelling they will get me") since arriving to the hospital, states he has not been taking his medication the last 5 days, but did restart them tonight. Patient denies suicial or homicidal ideation. Family requesting referral for "better therapist".

## 2018-04-28 NOTE — BHH Counselor (Addendum)
TTS attempted to reach patient's mother, Khian Debo, at [phone]. Message stated "unable to complete call at this time." TTS will see if there is any other collateral we can speak with.  9:26- TTS attempted to reach collateral at  [phone]. Voicemail is not set up.  1052- TTS attempted to reach patient's mother on above listed numbers a second time. No response.

## 2018-04-28 NOTE — ED Notes (Signed)
Pt's family member given belongings.

## 2018-04-28 NOTE — ED Notes (Signed)
Breakfast Tray Ordered. 
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
## 2018-04-28 NOTE — ED Notes (Signed)
Per [facility], plan is to reassess pt in the morning after shift change with tentative plan to d/c.

## 2018-04-28 NOTE — ED Notes (Signed)
Collateral Contact Ramiz Spannagel, mother, [phone] Per RN patient stepped out to lobby. Clinician will attempt collateral contact for additional information.

## 2018-07-07 ENCOUNTER — Encounter (HOSPITAL_COMMUNITY): Payer: Self-pay | Admitting: Emergency Medicine

## 2018-07-07 ENCOUNTER — Emergency Department (HOSPITAL_COMMUNITY)
Admission: EM | Admit: 2018-07-07 | Discharge: 2018-07-07 | Disposition: A | Discharge status: Home / Self Care | Payer: Medicaid Other | Attending: Emergency Medicine | Admitting: Emergency Medicine

## 2018-07-07 ENCOUNTER — Other Ambulatory Visit: Payer: Self-pay

## 2018-07-07 DIAGNOSIS — F259 Schizoaffective disorder, unspecified: Secondary | ICD-10-CM | POA: Insufficient documentation

## 2018-07-07 DIAGNOSIS — F17210 Nicotine dependence, cigarettes, uncomplicated: Secondary | ICD-10-CM | POA: Diagnosis not present

## 2018-07-07 DIAGNOSIS — Z760 Encounter for issue of repeat prescription: Secondary | ICD-10-CM | POA: Diagnosis present

## 2018-07-07 MED ORDER — RISPERIDONE 3 MG PO TABS: 3.0000 mg | ORAL_TABLET | Freq: Two times a day (BID) | ORAL | 0 refills | Status: DC

## 2018-07-07 MED ORDER — RISPERIDONE 3 MG PO TABS
3.0000 mg | ORAL_TABLET | Freq: Once | ORAL | Status: AC
  Administered 2018-07-07: 02:00:00 3 mg via ORAL
  Filled 2018-07-07: qty 1

## 2018-07-07 NOTE — ED Notes (Signed)
Patient verbalizes understanding of discharge instructions. Opportunity for questioning and answers were provided. Armband removed by staff, pt discharged from ED ambulatory.   

## 2018-07-07 NOTE — ED Provider Notes (Signed)
[facility] Provider Note   CSN: [id number] Arrival date & time: 07/07/18  0105    History   Chief Complaint Chief Complaint  Patient presents with  . Medication Refill    HPI Shane Allen is a 33 y.o. male.     The history is provided by the patient.  Medication Refill  Medications/supplies requested:  Risperdal Reason for request:  Prescriptions lost (States [facility]) Medications taken before: yes - see home medications   Patient has complete original prescription information: yes   States [facility] because he must wait to be seen by them and he has lost his RX. No SI or HI no AH or VH  Past Medical History:  Diagnosis Date  . Depression   . 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  Shane Allen. Schizo affective schizophrenia Shane Allen(HCC)     Patient Active Problem List   Diagnosis Date Noted  . Noncompliance 03/27/2016  . 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 Tobacco use disorder 10/27/2014    Past Surgical History:  Procedure Laterality Date  . NO PAST SURGERIES          Home Medications    Prior to Admission medications   Medication Sig Start Date End Date Taking? Authorizing Provider  asenapine (SAPHRIS) 5 MG SUBL 24 hr tablet Place 1 tablet (5 mg total) under the tongue 2 (two) times daily. 06/15/17 04/28/27  Liberty HandyGibbons, Claudia J, PA-C  chlorhexidine (PERIDEX) 0.12 % solution Use as directed 15 mLs in the mouth or throat 2 (two) times daily. 03/19/17   Luevenia MaxinFawze, Mina A, PA-C  hydrocortisone (ANUSOL-HC) 2.5 % rectal cream Apply rectally 2 times daily 09/13/17   Muthersbaugh, Dahlia ClientHannah, PA-C  ibuprofen (ADVIL,MOTRIN) 600 MG tablet Take 1 tablet (600 mg total) by mouth every 6 (six) hours as needed. Patient taking differently: Take 600 mg by mouth every 6 (six) hours as needed for mild pain.  03/19/17   Fawze, Mina A, PA-C  mupirocin  ointment (BACTROBAN) 2 % Apply 1 application topically 2 (two) times daily. 06/17/17   Wieters, Hallie C, PA-C  polyethylene glycol powder (GLYCOLAX/MIRALAX) powder Take 17 g by mouth 2 (two) times daily. 09/13/17   Muthersbaugh, Dahlia ClientHannah, PA-C  risperiDONE (RISPERDAL) 3 MG tablet Take 1 tablet (3 mg total) by mouth 2 (two) times daily. 01/20/17   Pucilowska, Braulio ConteJolanta B, MD  traZODone (DESYREL) 100 MG tablet Take 1 tablet (100 mg total) by mouth at bedtime as needed for sleep. 01/20/17   Shari ProwsPucilowska, Jolanta B, MD    Family History History reviewed. No pertinent family history.  Social History Social History   Tobacco Use  . Smoking status: Current Some Day Smoker    Packs/day: 1.00    Years: 10.00    Pack years: 10.00    Types: Cigarettes  . Smokeless tobacco: Never Used  Substance Use Topics  . Alcohol use: No    Comment: 4 to 5 beers a day every other week for sleep  . Drug use: No     Allergies   Haldol [haloperidol lactate]; Maalox [calcium carbonate antacid]; and Tylenol with codeine #3 [acetaminophen-codeine]   Review of Systems Review of Systems  Constitutional: Negative for fever.  Eyes: Negative for visual disturbance.  Respiratory: Negative for cough.   Cardiovascular: Negative for chest pain.  Neurological: Negative for speech difficulty.  Psychiatric/Behavioral: Negative for decreased concentration, dysphoric  mood, hallucinations, self-injury, sleep disturbance and suicidal ideas.  All other systems reviewed and are negative.    Physical Exam Updated Vital Signs BP (!) 135/91 (BP Location: Right Arm)   Pulse 71   Temp 97.9 F (36.6 C) (Oral)   Resp 16   Ht 5\' 11"  (1.803 m)   Wt 113.4 kg   SpO2 97%   BMI 34.87 kg/m   Physical Exam Vitals signs and nursing note reviewed.  Constitutional:      General: He is not in acute distress. HENT:     Head: Normocephalic and atraumatic.     Nose: Nose normal.  Eyes:     Conjunctiva/sclera: Conjunctivae normal.      Pupils: Pupils are equal, round, and reactive to light.  Neck:     Musculoskeletal: Normal range of motion and neck supple.  Cardiovascular:     Rate and Rhythm: Normal rate and regular rhythm.     Pulses: Normal pulses.     Heart sounds: Normal heart sounds.  Pulmonary:     Effort: Pulmonary effort is normal.     Breath sounds: Normal breath sounds. No wheezing or rales.  Abdominal:     General: Abdomen is flat. Bowel sounds are normal.     Tenderness: There is no abdominal tenderness. There is no guarding.  Musculoskeletal: Normal range of motion.  Skin:    General: Skin is warm and dry.     Capillary Refill: Capillary refill takes less than 2 seconds.  Neurological:     General: No focal deficit present.     Mental Status: He is alert and oriented to person, place, and time.  Psychiatric:        Mood and Affect: Mood normal.        Behavior: Behavior normal.      ED Treatments / Results  Labs (all labs ordered are listed, but only abnormal results are displayed) Labs Reviewed - No data to display  EKG None  Radiology No results found.  Procedures Procedures (including critical care time)  Medications Ordered in ED Medications  risperiDONE (RISPERDAL) tablet 3 mg (has no administration in time range)      Final Clinical Impressions(s) / ED Diagnoses   Final diagnoses:  Medication refill    Return for intractable cough, coughing up blood,fevers >100.4 unrelieved by medication, shortness of breath, intractable vomiting, chest pain, shortness of breath, weakness,numbness, changes in speech, facial asymmetry,abdominal pain, passing out,Inability to tolerate liquids or food, cough, altered mental status or any concerns. No signs of systemic illness or infection. The patient is nontoxic-appearing on exam and vital signs are within normal limits.   I have reviewed the triage vital signs and the nursing notes. Pertinent labs &imaging results that were  available during my care of the patient were reviewed by me and considered in my medical decision making (see chart for details).  After history, exam, and medical workup I feel the patient has been appropriately medically screened and is safe for discharge home. Pertinent diagnoses were discussed with the patient. Patient was given return precautions   Jacorion Klem, MD 07/07/18 1117

## 2018-07-07 NOTE — ED Triage Notes (Signed)
Pt stated he needs his risperidone refilled due to lost prescription. Would like 3mg  dose tonight.

## 2018-11-27 ENCOUNTER — Other Ambulatory Visit: Payer: Self-pay

## 2018-11-27 ENCOUNTER — Emergency Department (HOSPITAL_COMMUNITY)
Admission: EM | Admit: 2018-11-27 | Discharge: 2018-11-27 | Disposition: A | Discharge status: Home / Self Care | Payer: Medicaid Other | Attending: Emergency Medicine | Admitting: Emergency Medicine

## 2018-11-27 ENCOUNTER — Encounter (HOSPITAL_COMMUNITY): Payer: Self-pay | Admitting: Emergency Medicine

## 2018-11-27 DIAGNOSIS — Z5321 Procedure and treatment not carried out due to patient leaving prior to being seen by health care provider: Secondary | ICD-10-CM | POA: Diagnosis not present

## 2018-11-27 DIAGNOSIS — F29 Unspecified psychosis not due to a substance or known physiological condition: Secondary | ICD-10-CM | POA: Diagnosis present

## 2018-11-27 LAB — COMPREHENSIVE METABOLIC PANEL
ALT: 38 U/L (ref 0–44)
AST: 23 U/L (ref 15–41)
Albumin: 4.2 g/dL (ref 3.5–5.0)
Alkaline Phosphatase: 66 U/L (ref 38–126)
Anion gap: 11 (ref 5–15)
BUN: 12 mg/dL (ref 6–20)
CO2: 23 mmol/L (ref 22–32)
Calcium: 9.4 mg/dL (ref 8.9–10.3)
Chloride: 100 mmol/L (ref 98–111)
Creatinine, Ser: 0.97 mg/dL (ref 0.61–1.24)
GFR calc Af Amer: >60 mL/min (ref >60)
GFR calc non Af Amer: >60 mL/min (ref >60)
Glucose, Bld: 98 mg/dL (ref 70–99)
Potassium: 4 mmol/L (ref 3.5–5.1)
Sodium: 134 mmol/L — ABNORMAL LOW (ref 135–145)
Total Bilirubin: 0.8 mg/dL (ref 0.3–1.2)
Total Protein: 6.9 g/dL (ref 6.5–8.1)

## 2018-11-27 LAB — CBC
HCT: 46.1 % (ref 39.0–52.0)
Hemoglobin: 15.3 g/dL (ref 13.0–17.0)
MCH: 31.9 pg (ref 26.0–34.0)
MCHC: 33.2 g/dL (ref 30.0–36.0)
MCV: 96.2 fL (ref 80.0–100.0)
Platelets: 344 10*3/uL (ref 150–400)
RBC: 4.79 MIL/uL (ref 4.22–5.81)
RDW: 13.1 % (ref 11.5–15.5)
WBC: 10 10*3/uL (ref 4.0–10.5)
nRBC: 0 % (ref 0.0–0.2)

## 2018-11-27 LAB — SALICYLATE LEVEL: Salicylate Lvl: <7 mg/dL (ref 2.8–30.0)

## 2018-11-27 LAB — RAPID URINE DRUG SCREEN, HOSP PERFORMED
Amphetamines: NOT DETECTED
Barbiturates: NOT DETECTED
Benzodiazepines: NOT DETECTED
Cocaine: NOT DETECTED
Opiates: NOT DETECTED
Tetrahydrocannabinol: NOT DETECTED

## 2018-11-27 LAB — ACETAMINOPHEN LEVEL: Acetaminophen (Tylenol), Serum: <10 ug/mL — ABNORMAL LOW (ref 10–30)

## 2018-11-27 LAB — ETHANOL: Alcohol, Ethyl (B): <10 mg/dL (ref <10)

## 2018-11-27 NOTE — ED Triage Notes (Signed)
Pt has history of schizophrenia. Off his medication for 1 month.  States he is exhausted and needs to be back on medication.  Denies SI/HI.  Denies auditory and visual hallucinations.

## 2018-11-27 NOTE — BHH Counselor (Signed)
Per Corene Cornea, NP - patient will be observed overnight until collateral information can be obtained prior to disposition.

## 2018-11-27 NOTE — BH Assessment (Signed)
Tele Assessment Note   Patient Name: Shane Allen MRN: [id number] Referring Physician: Dr. Randal Buba Location of Patient: [facility] ED  Location of Provider: [facility]   Patient is a 33 year old male. Patient is a poor historian.  Per chart review, the patient has a history of schizoaffective Schizophrenia..  Patient seemed to be responding to internal stimuli during the assessment.  Patent denies Psychosis.SI/HI/Substance Abuse.   Patient was brought to the ED by his mother per   Patient reports that he came to the ED because he has not taken his medication in 1 year.  Patient reports that he sees a Teacher, music at [employer].   Per chart review the patient has been hospitalized at [facility] due to not taking his medication in 2018,2017, 2016 and 2014.  Patient reports that he lives with his mother and he receives disability.     Diagnosis: Schizoaffective   Past Medical History:  Past Medical History:  Diagnosis Date  . Depression   . 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  [name] affective schizophrenia [facility])     Past Surgical History:  Procedure Laterality Date  . NO PAST SURGERIES      Family History: No family history on file.  Social History:  reports that he has been smoking cigarettes. He has a 10.00 pack-year smoking history. He has never used smokeless tobacco. He reports that he does not drink alcohol or use drugs.  Additional Social History:  Alcohol / Drug Use History of alcohol / drug use?: No history of alcohol / drug abuse  CIWA: CIWA-Ar BP: (!) 131/94 Pulse Rate: 87 COWS:    Allergies:  Allergies  Allergen Reactions  . Haldol [Haloperidol Lactate] Other (See Comments)    Uncontrolled muscle movement  . Maalox [Calcium Carbonate Antacid] Other (See Comments)    Thinks this medication makes his face droop, but it could be another medication.  . Tylenol With Codeine #3  [Acetaminophen-Codeine] Palpitations    Home Medications: (Not in a hospital admission)   OB/GYN Status:  No LMP for male patient.  General Assessment Data Location of Assessment: [facility] ED TTS Assessment: In system Is this a Tele or Face-to-Face Assessment?: Tele Assessment Is this an Initial Assessment or a Re-assessment for this encounter?: Initial Assessment Patient Accompanied by:: Other Language Other than English: No Living Arrangements: (Lives with his mother ) What gender do you identify as?: Male Marital status: Single Maiden name: NA Pregnancy Status: No Living Arrangements: (Living with his mother ) Can pt return to current living arrangement?: Yes Admission Status: Voluntary Is patient capable of signing voluntary admission?: Yes Referral Source: Self/Family/Friend Insurance type: Medicaid     Crisis Care Plan Living Arrangements: (Living with his mother ) Name of Psychiatrist: (Dr. Ky Barban ) Name of Therapist: NA  Education Status Is patient currently in school?: No Is the patient employed, unemployed or receiving disability?: Unemployed  Risk to self with the past 6 months Suicidal Ideation: No Has patient been a risk to self within the past 6 months prior to admission? : No Suicidal Intent: No-Not Currently/Within Last 6 Months Has patient had any suicidal intent within the past 6 months prior to admission? : No Is patient at risk for suicide?: No Suicidal Plan?: No Has patient had any suicidal plan within the past 6 months prior to admission? : No Access to Means: No What has been your use of drugs/alcohol within  the last 12 months?: NA Previous Attempts/Gestures: (UTA) How many times?: (UTA) Other Self Harm Risks: NA Triggers for Past Attempts: Unknown Intentional Self Injurious Behavior: None Family Suicide History: No Recent stressful life event(s): (not taking psych medication) Persecutory voices/beliefs?: Yes Depression: Yes Depression  Symptoms: Despondent, Insomnia, Tearfulness, Isolating, Fatigue, Guilt, Loss of interest in usual pleasures, Feeling worthless/self pity Substance abuse history and/or treatment for substance abuse?: No  Risk to Others within the past 6 months Homicidal Ideation: No Does patient have any lifetime risk of violence toward others beyond the six months prior to admission? : No Thoughts of Harm to Others: No Current Homicidal Intent: No Current Homicidal Plan: No Access to Homicidal Means: No Identified Victim: na History of harm to others?: No Assessment of Violence: None Noted Violent Behavior Description: na Does patient have access to weapons?: No Criminal Charges Pending?: No Does patient have a court date: No Is patient on probation?: No  Psychosis Hallucinations: Auditory, Visual Delusions: Grandiose  Mental Status Report Appearance/Hygiene: In scrubs Eye Contact: Poor Motor Activity: Freedom of movement, Restlessness Speech: Soft, Slow Level of Consciousness: Quiet/awake Mood: Anxious, Suspicious Affect: Blunted Anxiety Level: Minimal Thought Processes: Flight of Ideas Judgement: Unable to Assess Orientation: Person, Place, Time, Situation Obsessive Compulsive Thoughts/Behaviors: Minimal  Cognitive Functioning Concentration: Decreased Memory: Recent Intact, Remote Intact Is patient IDD: No Insight: Poor Impulse Control: Poor Appetite: Fair Have you had any weight changes? : No Change Sleep: (UTA) Total Hours of Sleep: (UTA) Vegetative Symptoms: Unable to Assess  ADLScreening [facility] Assessment Services) Patient's cognitive ability adequate to safely complete daily activities?: Yes Patient able to express need for assistance with ADLs?: Yes Independently performs ADLs?: Yes (appropriate for developmental age)  Prior Inpatient Therapy Prior Inpatient Therapy: Yes Prior Therapy Dates: 2018; 2015 Prior Therapy Facilty/Provider(s): [facility] Reason for Treatment:  Psychosis  Prior Outpatient Therapy Prior Outpatient Therapy: Yes Prior Therapy Dates: Ongoing' Prior Therapy Facilty/Provider(s): [facility] Reason for Treatment: Psychosis Does patient have an ACCT team?: No Does patient have Intensive In-House Services?  : No Does patient have [facility] services? : No Does patient have P4CC services?: No  ADL Screening (condition at time of admission) Patient's cognitive ability adequate to safely complete daily activities?: Yes Is the patient deaf or have difficulty hearing?: No Does the patient have difficulty seeing, even when wearing glasses/contacts?: No Does the patient have difficulty concentrating, remembering, or making decisions?: Yes Patient able to express need for assistance with ADLs?: Yes Does the patient have difficulty dressing or bathing?: No Independently performs ADLs?: Yes (appropriate for developmental age) Does the patient have difficulty walking or climbing stairs?: No Weakness of Legs: None Weakness of Arms/Hands: None  Home Assistive Devices/Equipment Home Assistive Devices/Equipment: None      Values / Beliefs Cultural Requests During Hospitalization: None Spiritual Requests During Hospitalization: None Consults Spiritual Care Consult Needed: No Social Work Consult Needed: No Merchant navy officer (For Healthcare) Does Patient Have a Medical Advance Directive?: No Would patient like information on creating a medical advance directive?: No - Patient declined          Disposition: Disposition pending collateral information from his mother.  Disposition Initial Assessment Completed for this Encounter: Yes  This service was provided via telemedicine using a 2-way, interactive audio and video technology.  Names of all persons participating in this telemedicine service and their role in this encounter. Name: Phillip Heal  Role: MA, LCAS-A  Name: Arley Phenix  Mother     Phillip Heal LaVerne 11/27/2018 3:06  AM

## 2019-01-29 ENCOUNTER — Other Ambulatory Visit: Payer: Self-pay

## 2019-01-29 ENCOUNTER — Encounter (HOSPITAL_COMMUNITY): Payer: Self-pay | Admitting: Emergency Medicine

## 2019-01-29 ENCOUNTER — Emergency Department (HOSPITAL_COMMUNITY)
Admission: EM | Admit: 2019-01-29 | Discharge: 2019-01-29 | Disposition: A | Discharge status: Home / Self Care | Payer: Medicaid Other | Attending: Emergency Medicine | Admitting: Emergency Medicine

## 2019-01-29 DIAGNOSIS — F17210 Nicotine dependence, cigarettes, uncomplicated: Secondary | ICD-10-CM | POA: Diagnosis not present

## 2019-01-29 DIAGNOSIS — R030 Elevated blood-pressure reading, without diagnosis of hypertension: Secondary | ICD-10-CM | POA: Diagnosis not present

## 2019-01-29 DIAGNOSIS — Z79899 Other long term (current) drug therapy: Secondary | ICD-10-CM | POA: Insufficient documentation

## 2019-01-29 DIAGNOSIS — F259 Schizoaffective disorder, unspecified: Secondary | ICD-10-CM | POA: Insufficient documentation

## 2019-01-29 DIAGNOSIS — Z20828 Contact with and (suspected) exposure to other viral communicable diseases: Secondary | ICD-10-CM | POA: Diagnosis not present

## 2019-01-29 DIAGNOSIS — Z760 Encounter for issue of repeat prescription: Secondary | ICD-10-CM | POA: Diagnosis present

## 2019-01-29 DIAGNOSIS — R4189 Other symptoms and signs involving cognitive functions and awareness: Secondary | ICD-10-CM | POA: Insufficient documentation

## 2019-01-29 DIAGNOSIS — G479 Sleep disorder, unspecified: Secondary | ICD-10-CM | POA: Diagnosis not present

## 2019-01-29 LAB — SARS CORONAVIRUS 2 (TAT 6-24 HRS): SARS Coronavirus 2: NEGATIVE

## 2019-01-29 MED ORDER — RISPERIDONE 3 MG PO TABS: 3.0000 mg | ORAL_TABLET | Freq: Two times a day (BID) | ORAL | 0 refills | Status: DC

## 2019-01-29 NOTE — ED Notes (Addendum)
Pt being discharged has visitor in the room, visitor is asking for TTS and saying that the medications do not work and wants [facility] to admit.

## 2019-01-29 NOTE — ED Provider Notes (Signed)
[facility] EMERGENCY DEPARTMENT Provider Note   CSN: [id number] Arrival date & time: 01/29/19  1356     History   Chief Complaint Chief Complaint  Patient presents with  . Medication Refill    HPI Shane Allen is a 33 y.o. male.     HPI  Patient is a 33 year old male with schizoaffective schizophrenia presenting for medication refill today for Risperdal.  Patient states he has been on this medication for 2 years with good results states that he follows with [facility] who prescribed this medication and sees them on a monthly basis.  Patient states that he pulled out his medications 4 days ago to the toilet because he felt that he was doing well with medications however since then he has felt that he is decreased focus and concentration and would like to restart this medication.  Patient states he has been out of his medication a couple times in the past with no issues.  Patient denies any suicidal ideation homicidal ideation or any auditory visual hallucinations.  Denies any anxiety but states he has had some difficulty sleeping for the past 4 days.  Denies any feelings of being down or depressed.  Denies any cough fever congestion shortness of breath chest pain nausea vomiting diarrhea back pain or dizziness.  Past Medical History:  Diagnosis Date  . Depression   . 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  [name] affective schizophrenia [facility])     Patient Active Problem List   Diagnosis Date Noted  . Noncompliance 03/27/2016  . 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 Tobacco use disorder 10/27/2014    Past Surgical History:  Procedure Laterality Date  . NO PAST SURGERIES          Home Medications    Prior to Admission medications   Medication Sig Start Date End Date Taking? Authorizing Provider  asenapine (SAPHRIS) 5 MG SUBL 24 hr tablet Place 1 tablet (5 mg total) under the  tongue 2 (two) times daily. 06/15/17 04/28/27  Liberty Handy, PA-C  chlorhexidine (PERIDEX) 0.12 % solution Use as directed 15 mLs in the mouth or throat 2 (two) times daily. 03/19/17   Luevenia Maxin, Mina A, PA-C  hydrocortisone (ANUSOL-HC) 2.5 % rectal cream Apply rectally 2 times daily 09/13/17   Muthersbaugh, Dahlia Client, PA-C  ibuprofen (ADVIL,MOTRIN) 600 MG tablet Take 1 tablet (600 mg total) by mouth every 6 (six) hours as needed. Patient taking differently: Take 600 mg by mouth every 6 (six) hours as needed for mild pain.  03/19/17   Fawze, Mina A, PA-C  mupirocin ointment (BACTROBAN) 2 % Apply 1 application topically 2 (two) times daily. 06/17/17   Wieters, Hallie C, PA-C  polyethylene glycol powder (GLYCOLAX/MIRALAX) powder Take 17 g by mouth 2 (two) times daily. 09/13/17   Muthersbaugh, Dahlia Client, PA-C  risperiDONE (RISPERDAL) 3 MG tablet Take 1 tablet (3 mg total) by mouth 2 (two) times daily for 7 days. 01/29/19 02/05/19  Gailen Shelter, PA  traZODone (DESYREL) 100 MG tablet Take 1 tablet (100 mg total) by mouth at bedtime as needed for sleep. 01/20/17   Shari Prows, MD    Family History No family history on file.  Social History Social History   Tobacco Use  . Smoking status: Current Some Day Smoker    Packs/day: 1.00    Years: 10.00    Pack years: 10.00    Types: Cigarettes  .  Smokeless tobacco: Never Used  Substance Use Topics  . Alcohol use: No    Comment: 4 to 5 beers a day every other week for sleep  . Drug use: No     Allergies   Haldol [haloperidol lactate], Maalox [calcium carbonate antacid], and Tylenol with codeine #3 [acetaminophen-codeine]   Review of Systems Review of Systems  Constitutional: Negative for fever.  HENT: Negative for congestion.   Respiratory: Negative for cough.   Psychiatric/Behavioral: Positive for sleep disturbance. Negative for suicidal ideas.     Physical Exam Updated Vital Signs BP (!) 154/102 (BP Location: Left Arm)   Pulse 90    Temp 98 F (36.7 C) (Oral)   Resp 16   SpO2 100%   Physical Exam Vitals signs and nursing note reviewed.  Constitutional:      General: He is not in acute distress.    Appearance: Normal appearance. He is not ill-appearing.  HENT:     Head: Normocephalic and atraumatic.  Eyes:     General: No scleral icterus.       Right eye: No discharge.        Left eye: No discharge.     Conjunctiva/sclera: Conjunctivae normal.  Pulmonary:     Effort: Pulmonary effort is normal.     Breath sounds: No stridor.  Neurological:     Mental Status: He is alert and oriented to person, place, and time. Mental status is at baseline.     Comments: Patient is alert and oriented, able to answer questions appropriately and follow commands.  Flat affect and uses few words.  Withdrawn and makes poor eye contact.      ED Treatments / Results  Labs (all labs ordered are listed, but only abnormal results are displayed) Labs Reviewed - No data to display  EKG None  Radiology No results found.  Procedures Procedures (including critical care time)  Medications Ordered in ED Medications - No data to display   Initial Impression / Assessment and Plan / ED Course  I have reviewed the triage vital signs and the nursing notes.  Pertinent labs & imaging results that were available during my care of the patient were reviewed by me and considered in my medical decision making (see chart for details).        Today for medication refill for risperidone.  Patient has been off medication for a long time without any negative side effects.  Feels as symptoms are well-trolled on medication.  Denies any suicidal homicidal ideation or auditory visual hallucinations.  States he has had decreased focus in difficulty sleeping over the past 4 days since he stopped taking his medication.  Denies any other symptoms this time.  Patient agrees to follow-up with [facility] who prescribed his medication for reassessment  and continued refills.  He will call tomorrow morning.  Patient given 1 week of Risperdal  Patient is mildly hypertensive.  Discussed this with him  Final Clinical Impressions(s) / ED Diagnoses   Final diagnoses:  Medication refill  Schizo affective schizophrenia [facility])    ED Discharge Orders         Ordered    risperiDONE (RISPERDAL) 3 MG tablet  2 times daily     01/29/19 1436           Pati Gallo [place], [place] 01/29/19 1453    Maudie Flakes, MD 02/02/19 1718

## 2019-01-29 NOTE — ED Triage Notes (Signed)
Pt requesting to have [place] meds refilled.  States he dumped them all down the toilet over a week ago because he thought he didn't need to take them.  Requesting 1 month refill.  He has not contacted his doctor because it is the weekend.

## 2019-01-29 NOTE — ED Notes (Signed)
Pt mom Doris [phone]

## 2019-01-29 NOTE — Discharge Instructions (Addendum)
Call tomorrow morning to make an appointment with [facility]

## 2019-02-02 ENCOUNTER — Telehealth: Payer: Self-pay | Admitting: *Deleted

## 2019-02-02 NOTE — Telephone Encounter (Signed)
Patient given negative covid results . 

## 2019-05-18 ENCOUNTER — Ambulatory Visit (HOSPITAL_COMMUNITY)
Admission: EM | Admit: 2019-05-18 | Discharge: 2019-05-18 | Disposition: A | Discharge status: Home / Self Care | Payer: Medicaid Other | Attending: Family Medicine | Admitting: Family Medicine

## 2019-05-18 ENCOUNTER — Encounter (HOSPITAL_COMMUNITY): Payer: Self-pay

## 2019-05-18 ENCOUNTER — Other Ambulatory Visit: Payer: Self-pay

## 2019-05-18 DIAGNOSIS — F203 Undifferentiated schizophrenia: Secondary | ICD-10-CM

## 2019-05-18 MED ORDER — QUETIAPINE FUMARATE 50 MG PO TABS: 50.0000 mg | ORAL_TABLET | Freq: Every day | ORAL | 0 refills | Status: DC

## 2019-05-18 MED ORDER — VENLAFAXINE HCL 75 MG PO TABS: 75.0000 mg | ORAL_TABLET | Freq: Every day | ORAL | 0 refills | Status: DC

## 2019-05-18 NOTE — Discharge Instructions (Addendum)
Take the medication as prescribed Follow up with [facility]

## 2019-05-18 NOTE — ED Triage Notes (Signed)
Pt requests refill of anti-depressant medication and for seroquel. States he usually sees a doctor at [facility] for tx and medications. States took zoloft today

## 2019-05-18 NOTE — ED Provider Notes (Signed)
[facility]    CSN: [id number] Arrival date & time: 05/18/19  1634      History   Chief Complaint Chief Complaint  Patient presents with  . medication refill    HPI Shane Allen is a 34 y.o. male.   HPI  Patient has a history of schizophrenia, schizoaffective disorder, depression, anxiety.  He has a history of noncompliance.  He is under the care of a counselor at [facility].  He is out of his medications.  He requests Effexor 75 mg once a day and Seroquel 50 mg once a day.  As I look in the chart appears he is supposed to take Effexor twice daily.  He argues and says he is only taking it once a day, only on certain days, and sometimes he takes Zoloft instead.  He is no longer drinking.  Denies any drug use.  He states that his counselor is aware that he takes his medicine intermittently, and agrees with him managing his mental illness this way.  Although I see noncompliance is one of his diagnoses, I choose not to argue with him about the correct way to take his medication and encouraged him to take it daily, explaining that it really did work better this way  Past Medical History:  Diagnosis Date  . Depression   . 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  Shane Allen affective schizophrenia [facility])     Patient Active Problem List   Diagnosis Date Noted  . Noncompliance 03/27/2016  . 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 Tobacco use disorder 10/27/2014    Past Surgical History:  Procedure Laterality Date  . NO PAST SURGERIES         Home Medications    Prior to Admission medications   Medication Sig Start Date End Date Taking? Authorizing Provider  sertraline (ZOLOFT) 50 MG tablet Take 50 mg by mouth daily.   Yes [provider]  chlorhexidine (PERIDEX) 0.12 % solution Use as directed 15 mLs in the mouth or throat 2 (two) times daily. 03/19/17   Luevenia Maxin, Mina A, PA-C   hydrocortisone (ANUSOL-HC) 2.5 % rectal cream Apply rectally 2 times daily 09/13/17   Muthersbaugh, Dahlia Client, PA-C  ibuprofen (ADVIL,MOTRIN) 600 MG tablet Take 1 tablet (600 mg total) by mouth every 6 (six) hours as needed. Patient taking differently: Take 600 mg by mouth every 6 (six) hours as needed for mild pain.  03/19/17   Fawze, Mina A, PA-C  polyethylene glycol powder (GLYCOLAX/MIRALAX) powder Take 17 g by mouth 2 (two) times daily. 09/13/17   Muthersbaugh, Dahlia Client, PA-C  QUEtiapine (SEROQUEL) 50 MG tablet Take 1 tablet (50 mg total) by mouth at bedtime. 05/18/19   Eustace Moore, MD  venlafaxine [facility]) 75 MG tablet Take 1 tablet (75 mg total) by mouth daily. 05/18/19   Eustace Moore, MD  asenapine (SAPHRIS) 5 MG SUBL 24 hr tablet Place 1 tablet (5 mg total) under the tongue 2 (two) times daily. 06/15/17 05/18/19  Liberty Handy, PA-C  risperiDONE (RISPERDAL) 3 MG tablet Take 1 tablet (3 mg total) by mouth 2 (two) times daily for 7 days. 01/29/19 05/18/19  Gailen Shelter, PA  traZODone (DESYREL) 100 MG tablet Take 1 tablet (100 mg total) by mouth at bedtime as needed for sleep. 01/20/17 05/18/19  Shari Prows, MD    Family History Family History  Family history unknown: Yes  Social History Social History   Tobacco Use  . Smoking status: Current Some Day Smoker    Packs/day: 1.00    Years: 10.00    Pack years: 10.00    Types: Cigarettes  . Smokeless tobacco: Never Used  Substance Use Topics  . Alcohol use: No    Comment: 4 to 5 beers a day every other week for sleep  . Drug use: No     Allergies   Haldol [haloperidol lactate], Maalox [calcium carbonate antacid], and Tylenol with codeine #3 [acetaminophen-codeine]   Review of Systems Review of Systems  Constitutional:       No complaints     Physical Exam Triage Vital Signs ED Triage Vitals  Enc Vitals Group     BP 05/18/19 1707 (!) 150/111     Pulse Rate 05/18/19 1707 (!) 114     Resp 05/18/19  1707 16     Temp 05/18/19 1707 98.4 F (36.9 C)     Temp Source 05/18/19 1707 Oral     SpO2 05/18/19 1707 98 %     Weight --      Height --      Head Circumference --      Peak Flow --      Pain Score 05/18/19 1725 0     Pain Loc --      Pain Edu? --      Excl. in GC? --    No data found.  Updated Vital Signs BP (!) 150/105 (BP Location: Right Arm)   Pulse (!) 114   Temp 98.4 F (36.9 C) (Oral)   Resp 16   SpO2 98%      Physical Exam Constitutional:      General: He is not in acute distress.    Appearance: He is well-developed.     Comments: Obese.  Disheveled  HENT:     Head: Normocephalic and atraumatic.  Eyes:     Conjunctiva/sclera: Conjunctivae normal.     Pupils: Pupils are equal, round, and reactive to light.  Cardiovascular:     Rate and Rhythm: Normal rate.  Pulmonary:     Effort: Pulmonary effort is normal. No respiratory distress.  Abdominal:     General: There is no distension.     Palpations: Abdomen is soft.  Musculoskeletal:        General: Normal range of motion.     Cervical back: Normal range of motion.  Skin:    General: Skin is warm and dry.  Neurological:     Mental Status: He is alert.  Psychiatric:     Comments: Hesitant speech.  Poor eye contact      UC Treatments / Results  Labs (all labs ordered are listed, but only abnormal results are displayed) Labs Reviewed - No data to display  EKG   Radiology No results found.  Procedures Procedures (including critical care time)  Medications Ordered in UC Medications - No data to display  Initial Impression / Assessment and Plan / UC Course  I have reviewed the triage vital signs and the nursing notes.  Pertinent labs & imaging results that were available during my care of the patient were reviewed by me and considered in my medical decision making (see chart for details).      Final Clinical Impressions(s) / UC Diagnoses   Final diagnoses:  Schizophrenia,  undifferentiated (HCC)     Discharge Instructions     Take the medication as prescribed Follow up with [facility]  ED Prescriptions    Medication Sig Dispense Auth. Provider   venlafaxine (EFFEXOR) 75 MG tablet Take 1 tablet (75 mg total) by mouth daily. 30 tablet Raylene Everts, MD   QUEtiapine (SEROQUEL) 50 MG tablet Take 1 tablet (50 mg total) by mouth at bedtime. 30 tablet Raylene Everts, MD     PDMP not reviewed this encounter.   Raylene Everts, MD 05/18/19 2049

## 2019-08-21 ENCOUNTER — Ambulatory Visit (INDEPENDENT_AMBULATORY_CARE_PROVIDER_SITE_OTHER): Payer: Medicaid Other

## 2019-08-21 ENCOUNTER — Ambulatory Visit (HOSPITAL_COMMUNITY)
Admission: EM | Admit: 2019-08-21 | Discharge: 2019-08-21 | Disposition: A | Discharge status: Home / Self Care | Payer: Medicaid Other | Attending: Family Medicine | Admitting: Family Medicine

## 2019-08-21 ENCOUNTER — Other Ambulatory Visit: Payer: Self-pay

## 2019-08-21 ENCOUNTER — Encounter (HOSPITAL_COMMUNITY): Payer: Self-pay | Admitting: Emergency Medicine

## 2019-08-21 DIAGNOSIS — S99921A Unspecified injury of right foot, initial encounter: Secondary | ICD-10-CM

## 2019-08-21 DIAGNOSIS — M79671 Pain in right foot: Secondary | ICD-10-CM

## 2019-08-21 MED ORDER — IBUPROFEN 600 MG PO TABS: 600.0000 mg | ORAL_TABLET | Freq: Four times a day (QID) | ORAL | 0 refills | Status: DC | PRN

## 2019-08-21 NOTE — Discharge Instructions (Signed)
Please try soaking the foot  Please try ice  Please try the ibuprofen as needed  Please follow up in a few days if the symptoms are ongoing.
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
## 2019-08-21 NOTE — ED Provider Notes (Signed)
[place]    CSN: [id number] Arrival date & time: 08/21/19  1640      History   Chief Complaint Chief Complaint  Patient presents with  . Foot Pain    HPI Shane [facility] is a 34 y.o. male.   He is presenting with right foot pain.  Pain is occurring over the first MTP joint.  Door was closed on top of his foot.  Denies any significant pain today.  Has some swelling over the dorsum of the first MTP joint.  Is able to ambulate without pain.  HPI  Past Medical History:  Diagnosis Date  . Depression   . 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 Noncompliance 03/27/2016  . 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 Tobacco use disorder 10/27/2014    Past Surgical History:  Procedure Laterality Date  . NO PAST SURGERIES         Home Medications    Prior to Admission medications   Medication Sig Start Date End Date Taking? Authorizing Provider  QUEtiapine (SEROQUEL) 50 MG tablet Take 1 tablet (50 mg total) by mouth at bedtime. 05/18/19  Yes Raylene Everts, MD  venlafaxine [facility]) 75 MG tablet Take 1 tablet (75 mg total) by mouth daily. 05/18/19  Yes Raylene Everts, MD  hydrocortisone (ANUSOL-HC) 2.5 % rectal cream Apply rectally 2 times daily 09/13/17   Muthersbaugh, Jarrett Soho, PA-C  ibuprofen (ADVIL) 600 MG tablet Take 1 tablet (600 mg total) by mouth every 6 (six) hours as needed. 08/21/19   Rosemarie Ax, MD  polyethylene glycol powder (GLYCOLAX/MIRALAX) powder Take 17 g by mouth 2 (two) times daily. 09/13/17   Muthersbaugh, Jarrett Soho, PA-C  sertraline (ZOLOFT) 50 MG tablet Take 50 mg by mouth daily.    [provider]  asenapine (SAPHRIS) 5 MG SUBL 24 hr tablet Place 1 tablet (5 mg total) under the tongue 2 (two) times daily. 06/15/17 05/18/19  Kinnie Feil, PA-C  risperiDONE  (RISPERDAL) 3 MG tablet Take 1 tablet (3 mg total) by mouth 2 (two) times daily for 7 days. 01/29/19 05/18/19  Tedd Sias, PA  traZODone (DESYREL) 100 MG tablet Take 1 tablet (100 mg total) by mouth at bedtime as needed for sleep. 01/20/17 05/18/19  Clovis Fredrickson, MD    Family History Family History  Problem Relation Age of Onset  . Diabetes Mother     Social History Social History   Tobacco Use  . Smoking status: Current Some Day Smoker    Packs/day: 1.00    Years: 10.00    Pack years: 10.00    Types: Cigarettes  . Smokeless tobacco: Never Used  Substance Use Topics  . Alcohol use: No    Comment: 4 to 5 beers a day every other week for sleep  . Drug use: No     Allergies   Haldol [haloperidol lactate], Maalox [calcium carbonate antacid], and Tylenol with codeine #3 [acetaminophen-codeine]   Review of Systems Review of Systems  See HPI  Physical Exam Triage Vital Signs ED Triage Vitals  Enc Vitals Group     BP 08/21/19 1805 126/84     Pulse Rate 08/21/19 1805 90     Resp 08/21/19 1805 16     Temp 08/21/19 1805 98.1 F (36.7 C)  Temp Source 08/21/19 1805 Oral     SpO2 08/21/19 1805 99 %     Weight --      Height --      Head Circumference --      Peak Flow --      Pain Score 08/21/19 1820 2     Pain Loc --      Pain Edu? --      Excl. in GC? --    No data found.  Updated Vital Signs BP 126/84 (BP Location: Left Arm)   Pulse 90   Temp 98.1 F (36.7 C) (Oral)   Resp 16   SpO2 99%   Visual Acuity Right Eye Distance:   Left Eye Distance:   Bilateral Distance:    Right Eye Near:   Left Eye Near:    Bilateral Near:     Physical Exam Gen: NAD, alert, cooperative with exam, well-appearing ENT: normal lips, normal nasal mucosa,  Neuro: normal tone, normal sensation to touch Psych:  normal insight, alert and oriented MSK:  Right foot: No specific area of tenderness. Swelling of the dorsum of the foot. No streaking. Normal ankle  range of motion. Neurovascular intact   UC Treatments / Results  Labs (all labs ordered are listed, but only abnormal results are displayed) Labs Reviewed - No data to display  EKG   Radiology DG Foot Complete Right  Result Date: 08/21/2019 CLINICAL DATA:  Foot injury EXAM: RIGHT FOOT COMPLETE - 3+ VIEW COMPARISON:  02/10/2014 FINDINGS: There is no evidence of fracture or dislocation. There is no evidence of arthropathy or other focal bone abnormality. Soft tissues are unremarkable. IMPRESSION: Negative. Electronically Signed   By: Jasmine Pang M.D.   On: 08/21/2019 18:50    Procedures Procedures (including critical care time)  Medications Ordered in UC Medications - No data to display  Initial Impression / Assessment and Plan / UC Course  I have reviewed the triage vital signs and the nursing notes.  Pertinent labs & imaging results that were available during my care of the patient were reviewed by me and considered in my medical decision making (see chart for details).     Mr. Kalla is a 34 year old male that is presenting with right foot pain.  He had a door that was closed on top of his foot.  Imaging was negative for fracture.  No obvious foreign body appreciated.  Will provide with ibuprofen.  Counseled supportive care.  Given indications to follow-up.  Final Clinical Impressions(s) / UC Diagnoses   Final diagnoses:  Foot pain, right     Discharge Instructions     Please try soaking the foot  Please try ice  Please try the ibuprofen as needed  Please follow up in a few days if the symptoms are ongoing.     ED Prescriptions    Medication Sig Dispense Auth. Provider   ibuprofen (ADVIL) 600 MG tablet Take 1 tablet (600 mg total) by mouth every 6 (six) hours as needed. 30 tablet Myra Rude, MD     PDMP not reviewed this encounter.   Myra Rude, MD 08/21/19 [phone]

## 2019-09-24 ENCOUNTER — Encounter (HOSPITAL_COMMUNITY): Payer: Self-pay

## 2019-09-24 ENCOUNTER — Ambulatory Visit (HOSPITAL_COMMUNITY)
Admission: EM | Admit: 2019-09-24 | Discharge: 2019-09-24 | Disposition: A | Discharge status: Home / Self Care | Payer: Medicaid Other | Attending: Family Medicine | Admitting: Family Medicine

## 2019-09-24 DIAGNOSIS — Z20822 Contact with and (suspected) exposure to covid-19: Secondary | ICD-10-CM | POA: Insufficient documentation

## 2019-09-24 NOTE — ED Provider Notes (Signed)
[facility]   MRN: [id number] DOB: [birth year]  Subjective:   Shane [facility] is a 34 y.o. male presenting for COVID-19 screening.  Patient had exposure.  Denies any symptoms.  No current facility-administered medications for this encounter.  Current Outpatient Medications:    hydrocortisone (ANUSOL-HC) 2.5 % rectal cream, Apply rectally 2 times daily, Disp: 28.35 g, Rfl: 0   ibuprofen (ADVIL) 600 MG tablet, Take 1 tablet (600 mg total) by mouth every 6 (six) hours as needed., Disp: 30 tablet, Rfl: 0   polyethylene glycol powder (GLYCOLAX/MIRALAX) powder, Take 17 g by mouth 2 (two) times daily., Disp: 255 g, Rfl: 0   QUEtiapine (SEROQUEL) 50 MG tablet, Take 1 tablet (50 mg total) by mouth at bedtime., Disp: 30 tablet, Rfl: 0   sertraline (ZOLOFT) 50 MG tablet, Take 50 mg by mouth daily., Disp: , Rfl:    venlafaxine (EFFEXOR) 75 MG tablet, Take 1 tablet (75 mg total) by mouth daily., Disp: 30 tablet, Rfl: 0   Allergies  Allergen Reactions   Haldol [Haloperidol Lactate] Other (See Comments)    Uncontrolled muscle movement   Maalox [Calcium Carbonate Antacid] Other (See Comments)    Thinks this medication makes his face droop, but it could be another medication.   Tylenol With Codeine #3 [Acetaminophen-Codeine] Palpitations    Past Medical History:  Diagnosis Date   Depression    Gum disease since 2016   states its a hereditary disease that causes teeth and roots to rot.   Schizo affective schizophrenia [facility])      Past Surgical History:  Procedure Laterality Date   NO PAST SURGERIES      Family History  Problem Relation Age of Onset   Diabetes Mother     Social History   Tobacco Use   Smoking status: Current Some Day Smoker    Packs/day: 1.00    Years: 10.00    Pack years: 10.00    Types: Cigarettes   Smokeless tobacco: Never Used  Substance Use Topics   Alcohol use: No    Comment: 4 to 5 beers a day every other week for sleep    Drug use: No    ROS   Objective:   Vitals: BP 122/83    Pulse 70    Temp 98.3 F (36.8 C) (Oral)    Resp 16    Ht 5\' 11"  (1.803 m)    Wt (!) 225 lb (102.1 kg)    SpO2 99%    BMI 31.38 kg/m   Physical Exam Constitutional:      General: He is not in acute distress.    Appearance: Normal appearance. He is well-developed and normal weight. He is not ill-appearing, toxic-appearing or diaphoretic.  HENT:     Head: Normocephalic and atraumatic.     Right Ear: External ear normal.     Left Ear: External ear normal.     Nose: Nose normal.     Mouth/Throat:     Pharynx: Oropharynx is clear.  Eyes:     General: No scleral icterus.       Right eye: No discharge.        Left eye: No discharge.     Extraocular Movements: Extraocular movements intact.     Pupils: Pupils are equal, round, and reactive to light.  Cardiovascular:     Rate and Rhythm: Normal rate.  Pulmonary:     Effort: Pulmonary effort is normal.  Musculoskeletal:     Cervical back: Normal range  of motion.  Neurological:     Mental Status: He is alert and oriented to person, place, and time.  Psychiatric:        Mood and Affect: Mood normal.        Behavior: Behavior normal.        Thought Content: Thought content normal.        Judgment: Judgment normal.       Assessment and Plan :   PDMP not reviewed this encounter.  1. Exposure to COVID-19 virus     Counseled patient on nature of COVID-19 including modes of transmission, diagnostic testing, management and supportive care.  Counseled on medications used for symptomatic relief. COVID 19 testing is pending. Counseled patient on potential for adverse effects with medications prescribed/recommended today, ER and return-to-clinic precautions discussed, patient verbalized understanding.     Wallis Bamberg, PA-C 09/24/19 1840

## 2019-09-24 NOTE — ED Triage Notes (Signed)
Pt wants COVID test. Pt denies symptoms and denies COVID exposure.

## 2019-09-24 NOTE — Discharge Instructions (Signed)

## 2019-09-25 ENCOUNTER — Other Ambulatory Visit: Payer: Self-pay

## 2019-09-25 ENCOUNTER — Emergency Department (HOSPITAL_COMMUNITY)
Admission: EM | Admit: 2019-09-25 | Discharge: 2019-09-25 | Discharge status: Against Medical Advice | Payer: Medicaid Other

## 2019-09-25 LAB — SARS CORONAVIRUS 2 (TAT 6-24 HRS): SARS Coronavirus 2: NEGATIVE

## 2019-09-25 NOTE — ED Notes (Signed)
Called 3x for triage.

## 2019-09-25 NOTE — ED Notes (Signed)
No answer in lobby for triage vitals x2

## 2019-12-11 ENCOUNTER — Other Ambulatory Visit: Payer: Self-pay

## 2019-12-11 ENCOUNTER — Encounter (HOSPITAL_COMMUNITY): Payer: Self-pay

## 2019-12-11 ENCOUNTER — Ambulatory Visit (INDEPENDENT_AMBULATORY_CARE_PROVIDER_SITE_OTHER): Payer: Medicaid Other

## 2019-12-11 ENCOUNTER — Ambulatory Visit (HOSPITAL_COMMUNITY)
Admission: EM | Admit: 2019-12-11 | Discharge: 2019-12-11 | Disposition: A | Discharge status: Home / Self Care | Payer: Medicaid Other | Attending: Family Medicine | Admitting: Family Medicine

## 2019-12-11 DIAGNOSIS — M25532 Pain in left wrist: Secondary | ICD-10-CM

## 2019-12-11 DIAGNOSIS — S63502A Unspecified sprain of left wrist, initial encounter: Secondary | ICD-10-CM

## 2019-12-11 MED ORDER — IBUPROFEN 600 MG PO TABS: 600.0000 mg | ORAL_TABLET | Freq: Four times a day (QID) | ORAL | 0 refills | Status: DC | PRN

## 2019-12-11 NOTE — ED Provider Notes (Signed)
[facility]    CSN: [id number] Arrival date & time: 12/11/19  1703      History   Chief Complaint Chief Complaint  Patient presents with  . Wrist Injury    HPI Shane Allen is a 34 y.o. male.   HPI   Patient is here for left wrist pain. 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 The wrist is swollen. Severely painful. Pain with any movement. No prior wrist problems. He thinks that his wrist got wrenched. Past Medical History:  Diagnosis Date  . Depression   . 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  [name] affective schizophrenia [facility])     Patient Active Problem List   Diagnosis Date Noted  . Noncompliance 03/27/2016  . 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 Tobacco use disorder 10/27/2014    Past Surgical History:  Procedure Laterality Date  . NO PAST SURGERIES         Home Medications    Prior to Admission medications   Medication Sig Start Date End Date Taking? Authorizing Provider  hydrocortisone (ANUSOL-HC) 2.5 % rectal cream Apply rectally 2 times daily 09/13/17   Muthersbaugh, Dahlia Client, PA-C  ibuprofen (ADVIL) 600 MG tablet Take 1 tablet (600 mg total) by mouth every 6 (six) hours as needed. 12/11/19   Eustace Moore, MD  polyethylene glycol powder (GLYCOLAX/MIRALAX) powder Take 17 g by mouth 2 (two) times daily. 09/13/17   Muthersbaugh, Dahlia Client, PA-C  QUEtiapine (SEROQUEL) 50 MG tablet Take 1 tablet (50 mg total) by mouth at bedtime. 05/18/19   Eustace Moore, MD  sertraline (ZOLOFT) 50 MG tablet Take 50 mg by mouth daily.    [provider]  venlafaxine (EFFEXOR) 75 MG tablet Take 1 tablet (75 mg total) by mouth daily. 05/18/19   Eustace Moore, MD  asenapine (SAPHRIS) 5 MG SUBL 24 hr tablet Place 1 tablet (5 mg total) under the tongue 2 (two) times daily. 06/15/17 05/18/19  Liberty Handy, PA-C    risperiDONE (RISPERDAL) 3 MG tablet Take 1 tablet (3 mg total) by mouth 2 (two) times daily for 7 days. 01/29/19 05/18/19  Gailen Shelter, PA  traZODone (DESYREL) 100 MG tablet Take 1 tablet (100 mg total) by mouth at bedtime as needed for sleep. 01/20/17 05/18/19  Shari Prows, MD    Family History Family History  Problem Relation Age of Onset  . Diabetes Mother     Social History Social History   Tobacco Use  . Smoking status: Current Some Day Smoker    Packs/day: 1.00    Years: 10.00    Pack years: 10.00    Types: Cigarettes  . Smokeless tobacco: Never Used  Substance Use Topics  . Alcohol use: No    Comment: 4 to 5 beers a day every other week for sleep  . Drug use: No     Allergies   Haldol [haloperidol lactate], Maalox [calcium carbonate antacid], and Tylenol with codeine #3 [acetaminophen-codeine]   Review of Systems Review of Systems  See HPI Physical Exam Triage Vital Signs ED Triage Vitals  Enc Vitals Group     BP 12/11/19 1845 127/84     Pulse Rate 12/11/19 1845 74     Resp 12/11/19 1845 19     Temp 12/11/19 1845 99.1 F (37.3 C)     Temp  Source 12/11/19 1845 Oral     SpO2 12/11/19 1845 100 %     Weight --      Height --      Head Circumference --      Peak Flow --      Pain Score 12/11/19 1844 0     Pain Loc --      Pain Edu? --      Excl. in GC? --    No data found.  Updated Vital Signs BP 127/84 (BP Location: Right Arm)   Pulse 74   Temp 99.1 F (37.3 C) (Oral)   Resp 19   SpO2 100%      Physical Exam Constitutional:      General: He is not in acute distress.    Appearance: He is well-developed and normal weight.  HENT:     Head: Normocephalic and atraumatic.     Mouth/Throat:     Comments: See HPI Eyes:     Conjunctiva/sclera: Conjunctivae normal.     Pupils: Pupils are equal, round, and reactive to light.  Cardiovascular:     Rate and Rhythm: Normal rate.  Pulmonary:     Effort: Pulmonary effort is normal. No  respiratory distress.  Abdominal:     General: There is no distension.     Palpations: Abdomen is soft.  Musculoskeletal:        General: Normal range of motion.     Cervical back: Normal range of motion.     Comments: Patient cradles left wrist up close to body as if it is painful. Tenderness over the ulnar styloid. Mild soft tissue swelling over the carpal region diffusely. No bruising. Pain with finger flexion extended. Pain with wrist flexion extended.  Skin:    General: Skin is warm and dry.  Neurological:     Mental Status: He is alert.  Psychiatric:        Mood and Affect: Mood normal.        Behavior: Behavior normal.      UC Treatments / Results  Labs (all labs ordered are listed, but only abnormal results are displayed) Labs Reviewed - No data to display  EKG   Radiology DG Wrist Complete Left  Result Date: 12/11/2019 CLINICAL DATA:  Popping sensation in wrist with pain, initial encounter EXAM: LEFT WRIST - COMPLETE 3+ VIEW COMPARISON:  None. FINDINGS: There is no evidence of fracture or dislocation. There is no evidence of arthropathy or other focal bone abnormality. Soft tissues are unremarkable. IMPRESSION: No acute abnormality noted. Electronically Signed   By: Alcide Clever M.D.   On: 12/11/2019 19:40    Procedures Procedures (including critical care time)  Medications Ordered in UC Medications - No data to display  Initial Impression / Assessment and Plan / UC Course  I have reviewed the triage vital signs and the nursing notes.  Pertinent labs & imaging results that were available during my care of the patient were reviewed by me and considered in my medical decision making (see chart for details).     Wrist pain discussed.  Sprain.  Follow-up with sports medicine if not improving in a couple weeks Final Clinical Impressions(s) / UC Diagnoses   Final diagnoses:  Wrist sprain, left, initial encounter     Discharge Instructions     Continue using  ice to reduce pain and swelling Wear brace as long as first is painful Wear sling for the next 2 to 3 days Take ibuprofen as needed for  pain Follow-up with your doctor if not improving in a couple weeks    ED Prescriptions    Medication Sig Dispense Auth. Provider   ibuprofen (ADVIL) 600 MG tablet Take 1 tablet (600 mg total) by mouth every 6 (six) hours as needed. 30 tablet Eustace Moore, MD     PDMP not reviewed this encounter.   Eustace Moore, MD 12/11/19 2004

## 2019-12-11 NOTE — ED Notes (Signed)
Pt moves all fingers on Lt hand .Radial pulses strong and skin warm to touch. Pt reports pain to posterior forearm.

## 2019-12-11 NOTE — ED Triage Notes (Signed)
Pt reports his left wrist pop when he was taking some pole diggers out. Denies pain, swelling, numbness or tingling in hand or fingers.

## 2019-12-11 NOTE — Discharge Instructions (Signed)
Continue using ice to reduce pain and swelling Wear brace as long as first is painful Wear sling for the next 2 to 3 days Take ibuprofen as needed for pain Follow-up with your doctor if not improving in a couple weeks

## 2020-01-25 ENCOUNTER — Encounter (HOSPITAL_COMMUNITY): Payer: Self-pay

## 2020-01-25 ENCOUNTER — Other Ambulatory Visit: Payer: Self-pay

## 2020-01-25 ENCOUNTER — Ambulatory Visit (HOSPITAL_COMMUNITY)
Admission: EM | Admit: 2020-01-25 | Discharge: 2020-01-25 | Disposition: A | Discharge status: Other Acute Inpatient Hospital | Payer: Medicaid Other | Attending: Psychiatry | Admitting: Psychiatry

## 2020-01-25 DIAGNOSIS — F209 Schizophrenia, unspecified: Secondary | ICD-10-CM | POA: Insufficient documentation

## 2020-01-25 NOTE — Discharge Instructions (Addendum)
°
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
## 2020-01-25 NOTE — BH Assessment (Addendum)
Pt consented for clinician to contact his mother Shirlean Mylar, [phone]). Clinician received the following message: "the mailbox is full and can't accept any messages." Clinician was unable to a leave a HIPPA compliant voice message.    Redmond Pulling, MS, [facility], [facility] Triage Specialist [phone]
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
## 2020-01-28 ENCOUNTER — Encounter (HOSPITAL_COMMUNITY): Payer: Self-pay | Admitting: *Deleted

## 2020-01-28 ENCOUNTER — Other Ambulatory Visit: Payer: Self-pay

## 2020-01-28 ENCOUNTER — Emergency Department (HOSPITAL_COMMUNITY): Payer: Medicaid Other

## 2020-01-28 ENCOUNTER — Emergency Department (HOSPITAL_COMMUNITY)
Admission: EM | Admit: 2020-01-28 | Discharge: 2020-01-29 | Disposition: A | Discharge status: Home / Self Care | Payer: Medicaid Other | Attending: Emergency Medicine | Admitting: Emergency Medicine

## 2020-01-28 DIAGNOSIS — Z9119 Patient's noncompliance with other medical treatment and regimen: Secondary | ICD-10-CM | POA: Diagnosis not present

## 2020-01-28 DIAGNOSIS — Y92410 Unspecified street and highway as the place of occurrence of the external cause: Secondary | ICD-10-CM | POA: Diagnosis not present

## 2020-01-28 DIAGNOSIS — F259 Schizoaffective disorder, unspecified: Secondary | ICD-10-CM | POA: Insufficient documentation

## 2020-01-28 DIAGNOSIS — Z9114 Patient's other noncompliance with medication regimen: Secondary | ICD-10-CM

## 2020-01-28 DIAGNOSIS — Z20822 Contact with and (suspected) exposure to covid-19: Secondary | ICD-10-CM | POA: Insufficient documentation

## 2020-01-28 DIAGNOSIS — S0990XA Unspecified injury of head, initial encounter: Secondary | ICD-10-CM | POA: Diagnosis present

## 2020-01-28 DIAGNOSIS — F29 Unspecified psychosis not due to a substance or known physiological condition: Secondary | ICD-10-CM | POA: Insufficient documentation

## 2020-01-28 DIAGNOSIS — S0093XA Contusion of unspecified part of head, initial encounter: Secondary | ICD-10-CM | POA: Diagnosis not present

## 2020-01-28 DIAGNOSIS — S0083XA Contusion of other part of head, initial encounter: Secondary | ICD-10-CM | POA: Diagnosis not present

## 2020-01-28 DIAGNOSIS — F23 Brief psychotic disorder: Secondary | ICD-10-CM

## 2020-01-28 DIAGNOSIS — Z87891 Personal history of nicotine dependence: Secondary | ICD-10-CM | POA: Diagnosis not present

## 2020-01-28 LAB — COMPREHENSIVE METABOLIC PANEL
ALT: 19 U/L (ref 0–44)
AST: 21 U/L (ref 15–41)
Albumin: 5.1 g/dL — ABNORMAL HIGH (ref 3.5–5.0)
Alkaline Phosphatase: 56 U/L (ref 38–126)
Anion gap: 10 (ref 5–15)
BUN: 8 mg/dL (ref 6–20)
CO2: 25 mmol/L (ref 22–32)
Calcium: 10 mg/dL (ref 8.9–10.3)
Chloride: 104 mmol/L (ref 98–111)
Creatinine, Ser: 0.74 mg/dL (ref 0.61–1.24)
GFR, Estimated: >60 mL/min (ref >60)
Glucose, Bld: 95 mg/dL (ref 70–99)
Potassium: 3.5 mmol/L (ref 3.5–5.1)
Sodium: 139 mmol/L (ref 135–145)
Total Bilirubin: 0.5 mg/dL (ref 0.3–1.2)
Total Protein: 8 g/dL (ref 6.5–8.1)

## 2020-01-28 LAB — RESP PANEL BY RT-PCR (FLU A&B, COVID) ARPGX2
Influenza A by PCR: NEGATIVE
Influenza B by PCR: NEGATIVE
SARS Coronavirus 2 by RT PCR: NEGATIVE

## 2020-01-28 LAB — RAPID URINE DRUG SCREEN, HOSP PERFORMED
Amphetamines: NOT DETECTED
Barbiturates: NOT DETECTED
Benzodiazepines: NOT DETECTED
Cocaine: NOT DETECTED
Opiates: NOT DETECTED
Tetrahydrocannabinol: NOT DETECTED

## 2020-01-28 LAB — CBC
HCT: 46.5 % (ref 39.0–52.0)
Hemoglobin: 15.8 g/dL (ref 13.0–17.0)
MCH: 32 pg (ref 26.0–34.0)
MCHC: 34 g/dL (ref 30.0–36.0)
MCV: 94.1 fL (ref 80.0–100.0)
Platelets: 376 10*3/uL (ref 150–400)
RBC: 4.94 MIL/uL (ref 4.22–5.81)
RDW: 13.1 % (ref 11.5–15.5)
WBC: 9.5 10*3/uL (ref 4.0–10.5)
nRBC: 0 % (ref 0.0–0.2)

## 2020-01-28 LAB — ETHANOL: Alcohol, Ethyl (B): <10 mg/dL (ref <10)
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
## 2020-01-28 NOTE — ED Notes (Signed)
Pt walked out of room, not able to be redirected verbally. Pt stopped when security, Clinical research associate, and nurse stood to cut off path to door. Pt then returned to room without incident.

## 2020-01-28 NOTE — ED Notes (Signed)
Pt crawled out of room towards staff. Dispensing optician redirected back to room. Pt stated he was having a flashback. Pt sitting at bedside.
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
## 2020-01-28 NOTE — ED Notes (Signed)
Offered pt snack and drink, pt declined. Pt turned off light and laid down.

## 2020-01-28 NOTE — ED Notes (Signed)
Patient transported to CT 

## 2020-01-28 NOTE — ED Notes (Signed)
Pt's shirt, jeans, shoes, and wallet with $120 and cards are placed in a pt belonging bag. The bag was placed in locker 31. Money counted with pt and pt aware of belongings placed in locker.
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
## 2020-01-29 ENCOUNTER — Inpatient Hospital Stay (HOSPITAL_COMMUNITY)
Admission: AD | Admit: 2020-01-29 | Discharge: 2020-02-08 | DRG: 885 | Disposition: A | Discharge status: Home / Self Care | Payer: Medicaid Other | Source: Intra-hospital | Attending: Psychiatry | Admitting: Psychiatry

## 2020-01-29 DIAGNOSIS — F29 Unspecified psychosis not due to a substance or known physiological condition: Secondary | ICD-10-CM | POA: Diagnosis present

## 2020-01-29 DIAGNOSIS — F259 Schizoaffective disorder, unspecified: Principal | ICD-10-CM | POA: Diagnosis present

## 2020-01-29 DIAGNOSIS — S0191XA Laceration without foreign body of unspecified part of head, initial encounter: Secondary | ICD-10-CM | POA: Diagnosis present

## 2020-01-29 DIAGNOSIS — Z87891 Personal history of nicotine dependence: Secondary | ICD-10-CM | POA: Diagnosis not present

## 2020-01-29 DIAGNOSIS — F200 Paranoid schizophrenia: Secondary | ICD-10-CM | POA: Diagnosis not present

## 2020-01-29 DIAGNOSIS — F209 Schizophrenia, unspecified: Secondary | ICD-10-CM | POA: Diagnosis present

## 2020-01-29 DIAGNOSIS — Z91199 Patient's noncompliance with other medical treatment and regimen due to unspecified reason: Secondary | ICD-10-CM

## 2020-01-29 DIAGNOSIS — F203 Undifferentiated schizophrenia: Secondary | ICD-10-CM | POA: Diagnosis not present

## 2020-01-29 DIAGNOSIS — R001 Bradycardia, unspecified: Secondary | ICD-10-CM | POA: Diagnosis not present

## 2020-01-29 DIAGNOSIS — S0083XA Contusion of other part of head, initial encounter: Secondary | ICD-10-CM | POA: Diagnosis not present

## 2020-01-29 DIAGNOSIS — F17200 Nicotine dependence, unspecified, uncomplicated: Secondary | ICD-10-CM | POA: Diagnosis not present

## 2020-01-29 DIAGNOSIS — Z9119 Patient's noncompliance with other medical treatment and regimen: Secondary | ICD-10-CM

## 2020-01-29 DIAGNOSIS — F201 Disorganized schizophrenia: Secondary | ICD-10-CM | POA: Diagnosis not present

## 2020-01-29 DIAGNOSIS — Z20822 Contact with and (suspected) exposure to covid-19: Secondary | ICD-10-CM | POA: Diagnosis present

## 2020-01-29 MED ORDER — ZIPRASIDONE MESYLATE 20 MG IM SOLR
20.0000 mg | Freq: Once | INTRAMUSCULAR | Status: AC | PRN
  Administered 2020-01-29: 20 mg via INTRAMUSCULAR
  Filled 2020-01-29: qty 20

## 2020-01-29 MED ORDER — OLANZAPINE 5 MG PO TBDP: 5.0000 mg | ORAL_TABLET | Freq: Once | ORAL | Status: DC | PRN

## 2020-01-29 MED ORDER — NICOTINE 21 MG/24HR TD PT24
21.0000 mg | MEDICATED_PATCH | Freq: Every day | TRANSDERMAL | Status: DC
  Administered 2020-01-29: 21 mg via TRANSDERMAL
  Filled 2020-01-29: qty 1

## 2020-01-29 MED ORDER — LORAZEPAM 1 MG PO TABS: 1.0000 mg | ORAL_TABLET | Freq: Once | ORAL | Status: DC | PRN

## 2020-01-29 MED ORDER — STERILE WATER FOR INJECTION IJ SOLN
INTRAMUSCULAR | Status: AC
  Filled 2020-01-29: qty 10

## 2020-01-29 NOTE — BH Assessment (Signed)
[facility] Assessment Progress Note  Per Nelly Rout, MD, this pt requires psychiatric hospitalization.  Marquita Palms, RN, [facility] has assigned pt to [facility] Rm 502-1; [facility] will be ready to receive pt at 20:00.  Pt presents under IVC initiated by pt's mother, and upheld by EDP Meridee Score, MD, and IVC documents have been faxed to [facility].  Dr Charm Barges and pt's nurse, Waynetta Sandy, have been notified, and Waynetta Sandy agrees to call report to [phone].  Pt is to be transported via [employer].   Doylene Canning, [place] [facility] Coordinator [phone]
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
## 2020-01-29 NOTE — ED Notes (Signed)
Law enforcement contacted for patient transport

## 2020-01-29 NOTE — ED Notes (Addendum)
TTS completed pt recommended for inpatient but no beds available at [facility] at this time.

## 2020-01-29 NOTE — ED Notes (Signed)
Pt resting, sleeping comfortably.  

## 2020-01-29 NOTE — ED Notes (Signed)
Pt was resting, pacing, disorganized, pt stated " not thinking straight". Yelled out. Pt became elevated and difficult to redirect.  Provider notified.  PRN Geodon 20 mg given to pt.

## 2020-01-30 ENCOUNTER — Encounter (HOSPITAL_COMMUNITY): Payer: Self-pay | Admitting: Psychiatry

## 2020-01-30 ENCOUNTER — Other Ambulatory Visit: Payer: Self-pay

## 2020-01-30 DIAGNOSIS — F29 Unspecified psychosis not due to a substance or known physiological condition: Secondary | ICD-10-CM | POA: Diagnosis present

## 2020-01-30 DIAGNOSIS — F17200 Nicotine dependence, unspecified, uncomplicated: Secondary | ICD-10-CM

## 2020-01-30 DIAGNOSIS — F201 Disorganized schizophrenia: Secondary | ICD-10-CM

## 2020-01-30 DIAGNOSIS — F209 Schizophrenia, unspecified: Secondary | ICD-10-CM | POA: Diagnosis present

## 2020-01-30 LAB — LIPID PANEL
Cholesterol: 170 mg/dL (ref 0–200)
HDL: 45 mg/dL (ref >40)
LDL Cholesterol: 114 mg/dL — ABNORMAL HIGH (ref 0–99)
Total CHOL/HDL Ratio: 3.8 RATIO
Triglycerides: 57 mg/dL (ref <150)
VLDL: 11 mg/dL (ref 0–40)

## 2020-01-30 LAB — TSH: TSH: 0.478 u[IU]/mL (ref 0.350–4.500)

## 2020-01-30 MED ORDER — NICOTINE POLACRILEX 2 MG MT GUM
2.0000 mg | CHEWING_GUM | OROMUCOSAL | Status: DC | PRN
  Administered 2020-02-03 – 2020-02-07 (×14): 2 mg via ORAL
  Filled 2020-01-30 (×5): qty 1
  Filled 2020-01-30: qty 9

## 2020-01-30 MED ORDER — HYDROXYZINE HCL 25 MG PO TABS
25.0000 mg | ORAL_TABLET | Freq: Three times a day (TID) | ORAL | Status: DC | PRN
  Administered 2020-01-30 – 2020-02-02 (×4): 25 mg via ORAL
  Filled 2020-01-30 (×5): qty 1

## 2020-01-30 MED ORDER — ZIPRASIDONE MESYLATE 20 MG IM SOLR: 20.0000 mg | INTRAMUSCULAR | Status: DC | PRN

## 2020-01-30 MED ORDER — LORAZEPAM 1 MG PO TABS: 1.0000 mg | ORAL_TABLET | ORAL | Status: DC | PRN

## 2020-01-30 MED ORDER — OLANZAPINE 10 MG PO TBDP
10.0000 mg | ORAL_TABLET | Freq: Once | ORAL | Status: AC
  Administered 2020-01-30: 10 mg via ORAL
  Filled 2020-01-30 (×2): qty 1

## 2020-01-30 MED ORDER — MAGNESIUM HYDROXIDE 400 MG/5ML PO SUSP: 30.0000 mL | Freq: Every day | ORAL | Status: DC | PRN

## 2020-01-30 MED ORDER — ALUM & MAG HYDROXIDE-SIMETH 200-200-20 MG/5ML PO SUSP: 30.0000 mL | ORAL | Status: DC | PRN

## 2020-01-30 MED ORDER — TRAZODONE HCL 50 MG PO TABS
50.0000 mg | ORAL_TABLET | Freq: Every evening | ORAL | Status: DC | PRN
  Administered 2020-01-30 – 2020-02-02 (×3): 50 mg via ORAL
  Filled 2020-01-30 (×4): qty 1

## 2020-01-30 MED ORDER — OLANZAPINE 10 MG PO TBDP
10.0000 mg | ORAL_TABLET | Freq: Three times a day (TID) | ORAL | Status: DC | PRN
  Administered 2020-01-30: 10 mg via ORAL
  Filled 2020-01-30: qty 1

## 2020-01-30 MED ORDER — RISPERIDONE 3 MG PO TABS
3.0000 mg | ORAL_TABLET | Freq: Two times a day (BID) | ORAL | Status: DC
  Administered 2020-01-30: 3 mg via ORAL
  Filled 2020-01-30 (×6): qty 1

## 2020-01-30 MED ORDER — RISPERIDONE 3 MG PO TBDP
3.0000 mg | ORAL_TABLET | Freq: Two times a day (BID) | ORAL | Status: DC
  Administered 2020-01-30 – 2020-02-01 (×4): 3 mg via ORAL
  Filled 2020-01-30 (×6): qty 1
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
## 2020-01-30 NOTE — Progress Notes (Signed)
CI rounded on 500 hall  CI ran into pt and introduced herself  CI checked in with pt to see how he was feeling. Pt reported feeling ok but not have 2 packs of cigarettes was hard  CI will follow up if needed    Leane Para  Counseling Intern at [facility]
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
## 2020-01-30 NOTE — Progress Notes (Signed)
Adult Psychoeducational Group Note  Date:  01/30/2020 Time:  9:53 PM  Group Topic/Focus:  Wrap-Up Group:   The focus of this group is to help patients review their daily goal of treatment and discuss progress on daily workbooks.  Participation Level:  Did Not Attend  Participation Quality:  Did Not Attend  Affect:  Did Not Attend  Cognitive:  Did Not Attend  Insight: None  Engagement in Group:  Did Not Attend  Modes of Intervention:  Did Not Attend  Additional Comments:  Pt did not attend evening wrap up group tonight.  Shane Allen 01/30/2020, 9:53 PM 
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
## 2020-01-30 NOTE — Plan of Care (Signed)
Patient is newly admitted and needs stabilization of psychiatric symptoms.

## 2020-01-30 NOTE — Progress Notes (Signed)
Patient upset and irritable and anxious upon admission he denies needing to be admitted to [facility] but he does report that he receives outpatient services at [facility] and he is currently under IVC. He is Ox4 but denies all psych symptoms. He denies SI, HI &AVH. Per report he was acting bizzare and stated that God told him to go to the house next door and look into the window and kick the door in. Searched on admission by writer and staff no contraband was found at the time but later around 1245 patient gave MHT a bud of a cigarette. He is currently in bed resting quietly at this time.

## 2020-01-30 NOTE — Plan of Care (Signed)
  Problem: Activity: Goal: Will verbalize the importance of balancing activity with adequate rest periods Outcome: Progressing   Problem: Education: Goal: Will be free of psychotic symptoms Outcome: Progressing Goal: Knowledge of the prescribed therapeutic regimen will improve Outcome: Progressing   

## 2020-01-30 NOTE — Progress Notes (Signed)
   01/30/20 2200  Psych Admission Type (Psych Patients Only)  Admission Status Involuntary  Psychosocial Assessment  Patient Complaints None  Eye Contact Brief  Facial Expression Anxious  Affect Anxious;Preoccupied  Speech Logical/coherent;Tangential  Interaction Assertive  Motor Activity Fidgety;Pacing;Restless  Appearance/Hygiene Disheveled;Poor hygiene  Behavior Characteristics Cooperative;Appropriate to situation;Anxious  Mood Anxious;Apprehensive  Thought Process  Coherency Disorganized;Tangential  Content Paranoia  Delusions None reported or observed  Perception WDL  Hallucination None reported or observed  Judgment Poor  Confusion None  Danger to Self  Current suicidal ideation? Denies  Danger to Others  Danger to Others None reported or observed   Pt mother has been calling. Name is not on pt list so pt asked to call his mother to provide her with the code if he so chooses. Pt denies SI, HI, AVH and pain. Pt responses short and quick. Pt pacing the hall at times.

## 2020-01-30 NOTE — [facility] Suicide Risk Assessment (Addendum)
[facility] INPATIENT:  Family/Significant Other Suicide Prevention Education  Suicide Prevention Education:  Contact Attempts: Mother, Shirlean Mylar [phone]), has been identified by the patient as the family member/significant other with whom the patient will be residing, and identified as the person(s) who will aid the patient in the event of a mental health crisis.  With written consent from the patient, two attempts were made to provide suicide prevention education, prior to and/or following the patient's discharge.  We were unsuccessful in providing suicide prevention education.  A suicide education pamphlet was given to the patient to share with family/significant other.   Date and time of first attempt: 01/30/20 CSW was unable to leave voicemail for patients mother. Date and time of second attempt: 01/31/20 CSW left HIPAA compliant voicemail with this patients mother at [phone].  Unable to reach supports, SPE pamphlet placed on chart for patient to share with supports at discharge.   Ruthann Cancer MSW, LCSW Clincal Social Worker  [facility]

## 2020-01-30 NOTE — Progress Notes (Signed)
   01/30/20 0800  Psych Admission Type (Psych Patients Only)  Admission Status Involuntary  Psychosocial Assessment  Patient Complaints Anxiety;Worrying  Eye Contact Fair  Facial Expression Anxious;Pensive;Worried  Affect Anxious;Preoccupied  Systems analyst;Tangential  Interaction Assertive  Motor Activity Fidgety;Pacing;Restless  Appearance/Hygiene Disheveled;Poor hygiene  Behavior Characteristics Cooperative;Appropriate to situation;Anxious;Fidgety;Impulsive;Intrusive;Pacing  Mood Anxious;Suspicious;Apprehensive;Preoccupied;Pleasant  Thought Process  Coherency Disorganized;Tangential  Content Confabulation;Delusions;Paranoia  Delusions Paranoid;Persecutory  Perception WDL  Hallucination None reported or observed  Judgment Poor  Confusion None  Danger to Self  Current suicidal ideation? Denies  Danger to Others  Danger to Others None reported or observed

## 2020-01-30 NOTE — Progress Notes (Signed)
Pt came out his room and asked writer does he have a lighter. Writer explained to Pt that all hospitals are now smoke free campus and if he had anything to smoke he should hand it to Clinical research associate, and Clinical research associate would put it in his locker. Pt gave Clinical research associate half of a cigarette and Clinical research associate place it in locker 29 with the rest of his items. Writer informed pt that he would get it back at discharged.

## 2020-01-31 LAB — HEMOGLOBIN A1C
Hgb A1c MFr Bld: 5.3 % (ref 4.8–5.6)
Mean Plasma Glucose: 105.41 mg/dL

## 2020-01-31 MED ORDER — NICOTINE 21 MG/24HR TD PT24
21.0000 mg | MEDICATED_PATCH | Freq: Every day | TRANSDERMAL | Status: DC
  Filled 2020-01-31 (×10): qty 1

## 2020-01-31 NOTE — Progress Notes (Signed)
Adult Psychoeducational Group Note  Date:  01/31/2020 Time:  9:34 PM  Group Topic/Focus:  Wrap-Up Group:   The focus of this group is to help patients review their daily goal of treatment and discuss progress on daily workbooks.  Participation Level:  Minimal  Participation Quality:  Sharing  Affect:  Blunted  Cognitive:  Delusional  Insight: Appropriate and Lacking  Engagement in Group:  Engaged  Modes of Intervention:  Education  Additional Comments:  Patient attended and participated in group tonight. He reported that the day was Shane Allen 01/31/2020, 9:34 PM
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
## 2020-01-31 NOTE — [facility] Group Notes (Signed)
[facility] LCSW Group Therapy  01/31/2020 2:19 PM  Type of Therapy/Topic: Group Therapy: Emotion Regulation  Participation Level: Minimal  Description of Group: The purpose of this group is to assist patients in learning to regulate negative emotions and experience positive emotions. Patients will be guided to discuss ways in which they have been vulnerable to their negative emotions. These vulnerabilities will be juxtaposed with experiences of positive emotions or situations, and patients will be challenged to use positive emotions to combat negative ones. Special emphasis will be placed on coping with negative emotions in conflict situations, and patients will process healthy conflict resolution skills.  Therapeutic Goals: 1. Patient will identify two positive emotions or experiences to reflect on in order to balance out negative emotions 2. Patient will label two or more emotions that they find the most difficult to experience 3. Patient will demonstrate positive conflict resolution skills through discussion and/or role plays  Summary of Patient Progress: Patient attended group. Patient was disorganized, had delayed responses and all of his discussion was off topic. Patient was paranoid that the other group members were talking about him. Patient was hyper religious and was difficult to redirect.    Therapeutic Modalities: Cognitive Behavioral Therapy Feelings Identification Dialectical Behavioral Therapy     Malachy Chamber Corrie Brannen 01/31/2020, 2:19 PM
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
## 2020-01-31 NOTE — Tx Team (Signed)
Interdisciplinary Treatment and Diagnostic Plan Update  01/31/2020 Time of Session: 8:55am3 Shane Allen MRN: [id number]  Principal Diagnosis: Schizophrenia [name])  Secondary Diagnoses: Principal Problem:   Schizophrenia ([name]) Active Problems:   Tobacco use disorder   Current Medications:  Current Facility-Administered Medications  Medication Dose Route Frequency Provider Last Rate Last Admin  . alum & mag hydroxide-simeth (MAALOX/MYLANTA) 200-200-20 MG/5ML suspension 30 mL  30 mL Oral Q4H PRN Lindon Romp A, NP      . hydrOXYzine (ATARAX/VISTARIL) tablet 25 mg  25 mg Oral TID PRN Lindon Romp A, NP   25 mg at 01/30/20 1051  . OLANZapine zydis (ZYPREXA) disintegrating tablet 10 mg  10 mg Oral Q8H PRN [name], MD   10 mg at 01/30/20 1051   And  . LORazepam (ATIVAN) tablet 1 mg  1 mg Oral PRN [name], MD       And  . ziprasidone (GEODON) injection 20 mg  20 mg Intramuscular PRN [name], MD      . magnesium hydroxide (MILK OF MAGNESIA) suspension 30 mL  30 mL Oral Daily PRN Lindon Romp A, NP      . nicotine polacrilex (NICORETTE) gum 2 mg  2 mg Oral PRN [name], MD      . risperiDONE (RISPERDAL M-TABS) disintegrating tablet 3 mg  3 mg Oral BID [name], MD   3 mg at 01/31/20 0900  . traZODone (DESYREL) tablet 50 mg  50 mg Oral QHS PRN Lindon Romp A, NP   50 mg at 01/30/20 0351   PTA Medications: Medications Prior to Admission  Medication Sig Dispense Refill Last Dose  . acetaminophen (TYLENOL) 500 MG tablet Take 1,000 mg by mouth every 6 (six) hours as needed (pain).     [name] ibuprofen (ADVIL) 600 MG tablet Take 1 tablet (600 mg total) by mouth every 6 (six) hours as needed. (Patient not taking: Reported on 01/28/2020) 30 tablet 0 Not Taking at Unknown time  . QUEtiapine (SEROQUEL) 50 MG tablet Take 1 tablet (50 mg total) by mouth at bedtime. (Patient not taking: Reported on 01/28/2020) 30 tablet 0 Not Taking at Unknown  time  . venlafaxine (EFFEXOR) 75 MG tablet Take 1 tablet (75 mg total) by mouth daily. (Patient not taking: Reported on 01/28/2020) 30 tablet 0 Not Taking at Unknown time    Patient Stressors: Medication change or noncompliance Traumatic event  Patient Strengths: Average or above average intelligence Supportive family/friends  Treatment Modalities: Medication Management, Group therapy, Case management,  1 to 1 session with clinician, Psychoeducation, Recreational therapy.   Physician Treatment Plan for Primary Diagnosis: Schizophrenia ([place]) Long Term Goal(s): Improvement in symptoms so as ready for discharge Improvement in symptoms so as ready for discharge   Short Term Goals: Ability to identify changes in lifestyle to reduce recurrence of condition will improve Ability to demonstrate self-control will improve Ability to identify and develop effective coping behaviors will improve Compliance with prescribed medications will improve Ability to identify changes in lifestyle to reduce recurrence of condition will improve Ability to verbalize feelings will improve Ability to demonstrate self-control will improve Ability to identify and develop effective coping behaviors will improve Compliance with prescribed medications will improve Ability to identify triggers associated with substance abuse/mental health issues will improve  Medication Management: Evaluate patient's response, side effects, and tolerance of medication regimen.  Therapeutic Interventions: 1 to 1 sessions, Unit Group sessions and Medication administration.  Evaluation of Outcomes: Not Met  Physician Treatment Plan for Secondary Diagnosis: Principal Problem:   Schizophrenia ([place]) Active Problems:   Tobacco use disorder  Long Term Goal(s): Improvement in symptoms so as ready for discharge Improvement in symptoms so as ready for discharge   Short Term Goals: Ability to identify changes in lifestyle to reduce  recurrence of condition will improve Ability to demonstrate self-control will improve Ability to identify and develop effective coping behaviors will improve Compliance with prescribed medications will improve Ability to identify changes in lifestyle to reduce recurrence of condition will improve Ability to verbalize feelings will improve Ability to demonstrate self-control will improve Ability to identify and develop effective coping behaviors will improve Compliance with prescribed medications will improve Ability to identify triggers associated with substance abuse/mental health issues will improve     Medication Management: Evaluate patient's response, side effects, and tolerance of medication regimen.  Therapeutic Interventions: 1 to 1 sessions, Unit Group sessions and Medication administration.  Evaluation of Outcomes: Not Met   RN Treatment Plan for Primary Diagnosis: Schizophrenia ([place]) Long Term Goal(s): Knowledge of disease and therapeutic regimen to maintain health will improve  Short Term Goals: Ability to remain free from injury will improve, Ability to demonstrate self-control, Ability to participate in decision making will improve, Ability to verbalize feelings will improve, Ability to identify and develop effective coping behaviors will improve and Compliance with prescribed medications will improve  Medication Management: RN will administer medications as ordered by provider, will assess and evaluate patient's response and provide education to patient for prescribed medication. RN will report any adverse and/or side effects to prescribing provider.  Therapeutic Interventions: 1 on 1 counseling sessions, Psychoeducation, Medication administration, Evaluate responses to treatment, Monitor vital signs and CBGs as ordered, Perform/monitor CIWA, COWS, AIMS and Fall Risk screenings as ordered, Perform wound care treatments as ordered.  Evaluation of Outcomes: Not Met   LCSW  Treatment Plan for Primary Diagnosis: Schizophrenia ([place]) Long Term Goal(s): Safe transition to appropriate next level of care at discharge, Engage patient in therapeutic group addressing interpersonal concerns.  Short Term Goals: Engage patient in aftercare planning with referrals and resources, Increase social support, Increase ability to appropriately verbalize feelings, Facilitate acceptance of mental health diagnosis and concerns, Identify triggers associated with mental health/substance abuse issues and Increase skills for wellness and recovery  Therapeutic Interventions: Assess for all discharge needs, 1 to 1 time with Social worker, Explore available resources and support systems, Assess for adequacy in community support network, Educate family and significant other(s) on suicide prevention, Complete Psychosocial Assessment, Interpersonal group therapy.  Evaluation of Outcomes: Not Met   Progress in Treatment: Attending groups: Yes. Participating in groups: Yes. Taking medication as prescribed: Yes. Toleration medication: Yes. Family/Significant other contact made: No, will contact:  have attempted to contact mother Patient understands diagnosis: No. Discussing patient identified problems/goals with staff: Yes. Medical problems stabilized or resolved: Yes. Denies suicidal/homicidal ideation: Yes. Issues/concerns per patient self-inventory: No.   New problem(s) identified: No, Describe:  none  New Short Term/Long Term Goal(s): medication stabilization, elimination of SI thoughts, development of comprehensive mental wellness plan.   Patient Goals:  "Don't end up back where I am now"  Discharge Plan or Barriers: Patient currently lives with mother, CSW has been unable to reach mother to confirm that pt is able to return. Pt is currently declining all follow up.   Reason for Continuation of Hospitalization: Delusions  Hallucinations Medication stabilization  Estimated Length of  Stay: 3-5 days  Attendees: Patient: Shane Allen  01/31/2020   Physician: Ernie Hew, MD 01/31/2020   Nursing:  01/31/2020   RN Care Manager: 01/31/2020   Social Worker: Darletta Moll, LCSW 01/31/2020  Recreational Therapist:  01/31/2020  Other:  01/31/2020   Other:  01/31/2020  Other: 01/31/2020     Scribe for Treatment Team: Vassie Moselle, LCSW 01/31/2020 10:07 AM

## 2020-01-31 NOTE — Progress Notes (Signed)
Recreation Therapy Notes  Date: 12.8.21 Time: 0950 Location: [address] Dayroom  Group Topic: Coping Skills  Goal Area(s) Addresses:  Patient will identify positive coping skills. Patient will identify benefit of using coping skills.  Intervention: Worksheet; Pencils  Activity: Mind Map.  LRT and patients came up with 8 (anger, anxiety, communication, family/people, stressed, emotions, needs/wants and self care) instances in which coping skills would be used.  Patients were to then come up with at least 3 coping skills to deal with each area identified.  Education: Coping Skills, Discharge Planning.   Education Outcome: Acknowledges understanding/In group clarification offered/Needs additional education.   Clinical Observations/Feedback: Pt did not attend group.    Shane Allen, LRT/CTRS         Shane Allen 01/31/2020 12:05 PM 

## 2020-02-01 MED ORDER — RISPERIDONE 2 MG PO TBDP
4.0000 mg | ORAL_TABLET | Freq: Two times a day (BID) | ORAL | Status: DC
  Administered 2020-02-01 – 2020-02-03 (×4): 4 mg via ORAL
  Filled 2020-02-01 (×8): qty 2

## 2020-02-01 NOTE — Progress Notes (Signed)
Pt presents with flat affect but denies feeling anxious, depressed or angry.  Pt denies having SI/HI/AVH.  Pt has difficulty taking pills stating he doesn't like the taste and it is unclear if pt is trying to cheek his medications.  RN will continue to closely monitor during medication administration and provide encouragement.  Pt is safe with q 15 min checks in place.

## 2020-02-01 NOTE — Progress Notes (Signed)
Recreation Therapy Notes  Date: 12.9.21 Time: 1000 Location: 500 Hall Dayroom  Group Topic: Self-Esteem  Goal Area(s) Addresses:  Patient will identify positive character traits about themselves.  Patient will identify positive activities they like. Patient will identify any positive things that sets them apart from others.  Behavioral Response: None  Intervention: Construction paper, safety scissors, glue sticks, magazines  Activity: Collage.  Patients were to create a collage, using the supplies provided, that would highlight any positive characteristics or positive activities that describes them using any pictures or words from the magazines.  Education:  Self-esteem, Discharge planning  Education Outcome: Acknowledges understanding/In group clarification offered/Needs additional education  Clinical Observations/Feedback: Pt did not participate in activity.  Pt was unable to sit still.  Pt was in and out of group before leaving and not returning.    Caroll Rancher, LRT/CTRS         Caroll Rancher A 02/01/2020 12:41 PM

## 2020-02-01 NOTE — Progress Notes (Signed)
Pt up in the room very paranoid , pt hit the STARR button 2 times tonight.

## 2020-02-01 NOTE — Progress Notes (Signed)
   02/01/20 2100  Psych Admission Type (Psych Patients Only)  Admission Status Involuntary  Psychosocial Assessment  Patient Complaints None;Suspiciousness  Eye Contact Brief  Facial Expression Anxious  Affect Anxious;Preoccupied;Blunted  Speech Logical/coherent;Tangential  Interaction Assertive  Motor Activity Fidgety;Pacing;Restless  Appearance/Hygiene Disheveled;Poor hygiene  Behavior Characteristics Anxious  Mood Preoccupied  Thought Process  Coherency Disorganized;Tangential  Content Paranoia  Delusions None reported or observed  Perception WDL  Hallucination None reported or observed  Judgment Poor  Confusion None  Danger to Self  Current suicidal ideation? Denies  Danger to Others  Danger to Others None reported or observed

## 2020-02-01 NOTE — Progress Notes (Signed)
[facility] MD Progress Note  02/01/2020 10:27 AM Shane Allen  MRN:  [id number] Subjective:  Patient reports he is doing well this AM, but he does not like his medication as it leaves a bad taste in his mouth. In the middle of the interview the patient gets up to attempt to get pieces of the dissolved M-tab out of his mouth despite nursing attempting to make sure that he allowed it time to dissolve. Patient reports that he had a good night, but he did hit the STARR button last night. Patient recalls that he hit it one time and reports that he was only hitting the button because he wanted to know what would happen. Per EMR patient actually hit the button 2x overnight and appeared to be very paranoid. Patient reports that he is not concerned about anything on the unit. He reports that the Shaune PollackLord does still speak to him and says "get out of here and get back to my position" when asked patient reports that "his position" is "here." Patient does not endorse akathisia. Patient does Mudloggercontract for  Safety. Principal Problem: Schizophrenia (H[facility]) Diagnosis: Principal Problem:   Schizophrenia (H[facility]) Active Problems:   Tobacco use disorder   Noncompliance  Total Time spent with patient: 15 minutes  Past Psychiatric History: See H&P  Past Medical History:  Past Medical History:  Diagnosis Date  . Depression   . 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  [name]. Schizo affective schizophrenia [facility](HCC)     Past Surgical History:  Procedure Laterality Date  . NO PAST SURGERIES     Family History:  Family History  Problem Relation Age of Onset  . Diabetes Mother    Family Psychiatric  History: See H&P Social History:  Social History   Substance and Sexual Activity  Alcohol Use No   Comment: 4 to 5 beers a day every other week for sleep     Social History   Substance and Sexual Activity  Drug Use No    Social History   Socioeconomic History  .  Marital status: Single    Spouse name: Not on file  . Number of children: Not on file  . Years of education: Not on file  . Highest education level: Not on file  Occupational History  . Not on file  Tobacco Use  . Smoking status: Former Smoker    Packs/day: 1.00    Years: 10.00    Pack years: 10.00    Types: Cigarettes  . Smokeless tobacco: Never Used  Substance and Sexual Activity  . Alcohol use: No    Comment: 4 to 5 beers a day every other week for sleep  . Drug use: No  . Sexual activity: Never    Birth control/protection: None  Other Topics Concern  . Not on file  Social History Narrative  . Not on file   Social Determinants of Health   Financial Resource Strain: Not on file  Food Insecurity: Not on file  Transportation Needs: Not on file  Physical Activity: Not on file  Stress: Not on file  Social Connections: Not on file   Additional Social History:                         Sleep: Poor  Appetite:  Fair  Current Medications: Current Facility-Administered Medications  Medication Dose Route Frequency Provider Last Rate Last Admin  . alum &  mag hydroxide-simeth (MAALOX/MYLANTA) 200-200-20 MG/5ML suspension 30 mL  30 mL Oral Q4H PRN Nira Conn A, NP      . hydrOXYzine (ATARAX/VISTARIL) tablet 25 mg  25 mg Oral TID PRN Nira Conn A, NP   25 mg at 01/30/20 1051  . OLANZapine zydis (ZYPREXA) disintegrating tablet 10 mg  10 mg Oral Q8H PRN Estella Husk, MD   10 mg at 01/30/20 1051   And  . LORazepam (ATIVAN) tablet 1 mg  1 mg Oral PRN Estella Husk, MD       And  . ziprasidone (GEODON) injection 20 mg  20 mg Intramuscular PRN Estella Husk, MD      . magnesium hydroxide (MILK OF MAGNESIA) suspension 30 mL  30 mL Oral Daily PRN Nira Conn A, NP      . nicotine (NICODERM CQ - dosed in mg/24 hours) patch 21 mg  21 mg Transdermal Daily Eliseo Gum B, MD      . nicotine polacrilex (NICORETTE) gum 2 mg  2 mg Oral PRN Estella Husk, MD      . risperiDONE (RISPERDAL M-TABS) disintegrating tablet 4 mg  4 mg Oral BID Estella Husk, MD      . traZODone (DESYREL) tablet 50 mg  50 mg Oral QHS PRN Jackelyn Poling, NP   50 mg at 01/30/20 7846    Lab Results:  Results for orders placed or performed during the hospital encounter of 01/29/20 (from the past 48 hour(s))  Hemoglobin A1c     Status: None   Collection Time: 01/30/20  6:02 PM  Result Value Ref Range   Hgb A1c MFr Bld 5.3 4.8 - 5.6 %    Comment: (NOTE) Pre diabetes:          5.7%-6.4%  Diabetes:              >6.4%  Glycemic control for   <7.0% adults with diabetes    Mean Plasma Glucose 105.41 mg/dL    Comment: Performed at [facility], [address], [place], [place] [address]  Lipid panel     Status: Abnormal   Collection Time: 01/30/20  6:02 PM  Result Value Ref Range   Cholesterol 170 0 - 200 mg/dL   Triglycerides 57 <284 mg/dL   HDL 45 >13 mg/dL   Total CHOL/HDL Ratio 3.8 RATIO   VLDL 11 0 - 40 mg/dL   LDL Cholesterol 244 (H) 0 - 99 mg/dL    Comment:        Total Cholesterol/HDL:CHD Risk Coronary Heart Disease Risk Table                     Men   Women  1/2 Average Risk   3.4   3.3  Average Risk       5.0   4.4  2 X Average Risk   9.6   7.1  3 X Average Risk  23.4   11.0        Use the calculated Patient Ratio above and the CHD Risk Table to determine the patient's CHD Risk.        ATP III CLASSIFICATION (LDL):  <100     mg/dL   Optimal  010-272  mg/dL   Near or Above                    Optimal  130-159  mg/dL   Borderline  536-644  mg/dL  High  >190     mg/dL   Very High Performed at [facility], [address] [place], [place] [address]   TSH     Status: None   Collection Time: 01/30/20  6:02 PM  Result Value Ref Range   TSH 0.478 0.350 - 4.500 uIU/mL    Comment: Performed by a 3rd Generation assay with a functional sensitivity of <=0.01 uIU/mL. Performed at [facility], [address], [place], [place] [address]     Blood Alcohol level:  Lab Results  Component Value Date   [facility] <10 01/28/2020   ETH <10 11/27/2018    Metabolic Disorder Labs: Lab Results  Component Value Date   HGBA1C 5.3 01/30/2020   MPG 105.41 01/30/2020   MPG 108.28 01/19/2017   Lab Results  Component Value Date   PROLACTIN 21.7 (H) 08/18/2015   Lab Results  Component Value Date   CHOL 170 01/30/2020   TRIG 57 01/30/2020   HDL 45 01/30/2020   CHOLHDL 3.8 01/30/2020   VLDL 11 01/30/2020   LDLCALC 114 (H) 01/30/2020   LDLCALC 94 01/19/2017    Physical Findings: AIMS: Facial and Oral Movements Muscles of Facial Expression: None, normal Lips and Perioral Area: None, normal Jaw: None, normal Tongue: None, normal,Extremity Movements Upper (arms, wrists, hands, fingers): None, normal Lower (legs, knees, ankles, toes): None, normal, Trunk Movements Neck, shoulders, hips: None, normal, Overall Severity Severity of abnormal movements (highest score from questions above): None, normal Incapacitation due to abnormal movements: None, normal Patient's awareness of abnormal movements (rate only patient's report): No Awareness, Dental Status Current problems with teeth and/or dentures?: No Does patient usually wear dentures?: No  CIWA:    COWS:     Musculoskeletal: Strength & Muscle Tone: within normal limits Gait & Station: normal Patient leans: N/A  Psychiatric Specialty Exam: Physical Exam HENT:     Head: Normocephalic and atraumatic.  Pulmonary:     Effort: Pulmonary effort is normal.  Neurological:     Mental Status: He is alert.     Review of Systems  Cardiovascular: Negative for chest pain.  Gastrointestinal: Negative for abdominal distention.  Neurological: Negative for headaches.    Blood pressure (!) 128/93, pulse 84, temperature (!) 97.4 F (36.3 C), temperature source Oral, resp. rate 18, height 5\' 11"  (1.803 m), weight 95.3 kg, SpO2 98  %.Body mass index is 29.29 kg/m.  General Appearance: Disheveled prefers to stay in his clothes that are holey and don't fit  Eye Contact:  Fair  Speech:  Blocked  Volume:  Normal  Mood:  "good"  Affect:  Flat  Thought Process:  Disorganized  Orientation:  NA  Thought Content:  Delusions and Tangential  Suicidal Thoughts:  No  Homicidal Thoughts:  No  Memory:  Recent;   Poor  Judgement:  Impaired  Insight:  Lacking  Psychomotor Activity:  Normal  Concentration:  Concentration: Poor  Recall:  NA  Fund of Knowledge:  Poor  Language:  Poor  Akathisia:  No  Handed:  Right  AIMS (if indicated):     Assets:  Physical Health  ADL's:  Intact  Cognition:  WNL  Sleep:  Number of Hours: 4.25     Treatment Plan Summary: Daily contact with patient to assess and evaluate symptoms and progress in treatment Mr. Birchall is a 34 yo patient w/ history of schizophrenia who continues to endorse symptoms such as disorganized thought with thought blocking. Patient has also been noted  to respond to internal stimuli at times on the unit and per unit staff patient continues to exhibit paranoia that appears to worsen at night. Per Clinical research associate patient is less paranoid during the day as he appeared moreso, on admission day. Patient is contracting for safety, but continues to be on unit restrictions as he has attempted to elope and endorses thoughts of eloping. Patient has no insight into his situation. Patient also continues to have issues with medication compliance. Patient has been witnessed cheeking medications and has been switched to M-tabs yet continues to try to spit out the medication. Nursing is attempting to watch patient take his meds but he was noted on interview today to try and scoop left over pieces out of his mouth.  Schizophrenia - Increase Risperdal 4mg  BID, will monitor an titrate  - Continue to monitor patient medication compliance, patient may need different formulary if compliance continues to  be an issue - Monitor for EPS symptoms as patient has reported Akathisia in the past  PRN -Tylenol 650mg  q6h,pain -Maalox 49ml q4h, indigestion -Atarax 25mg  TID, anxiety Agitation Protocol  Zyprexa 10mg  Ativan 1mg   Geodon 20mg  -Milk of Mag 18mL, constipation -Trazodone 50mg  QHS, insomnia 31m, MD 02/01/2020, 10:27 AM

## 2020-02-01 NOTE — [facility] Group Notes (Signed)
Occupational Therapy Group Note Date: 02/01/2020 Group Topic/Focus: Socialization/Social Skills  Group Description: Group encouraged increased social engagement and participation through discussion/activity focused on improving socialization and age appropriate social skills. Patients were encouraged to fill out a structured worksheet with the following three prompts: one thing I want you to know about me, the hardest thing that I am dealing with today is, and my goal for today is... Discussion followed with patients sharing their responses and then transitioned into an interactive "Name 5" activity to promote socialization and orientation.   Therapeutic Goal(s): Demonstrate age-appropriate social skills and socialization within a structured group setting Demonstrate orientation to topic and identify relevant responses to group discussion.  Participation Level: Active   Participation Quality: Minimal Cues   Behavior: Calm and Parallel   Speech/Thought Process: Directed, Disorganized and Thought-blocking   Affect/Mood: Constricted   Insight: Poor   Judgement: Poor   Individualization: Shane Allen was present in group space and engaged in discussion, though presented with constricted affect and guarded. Pt responded to prompts one thing I want you to know about me "I'm here" and goal for today "get a goal." Pt engaged in name 5 activity, occasionally, though often skipped his turn when called on stating "I have nothing left to say."  Modes of Intervention: Discussion, Education, Reality Testing and Socialization  Patient Response to Interventions:  Attentive   Plan: Continue to engage patient in OT groups 2 - 3x/week.  02/01/2020  Shane Allen, MOT, OTR/L
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
## 2020-02-01 NOTE — Progress Notes (Signed)
   01/31/20 2000  Psych Admission Type (Psych Patients Only)  Admission Status Involuntary  Psychosocial Assessment  Patient Complaints None  Eye Contact Brief  Facial Expression Anxious  Affect Anxious;Preoccupied;Blunted  Speech Logical/coherent;Tangential  Interaction Assertive  Motor Activity Fidgety;Pacing;Restless  Appearance/Hygiene Disheveled;Poor hygiene  Behavior Characteristics Impulsive  Mood Anxious  Thought Process  Coherency Disorganized;Tangential  Content Paranoia  Delusions None reported or observed  Perception WDL  Hallucination None reported or observed  Judgment Poor  Confusion None  Danger to Self  Current suicidal ideation? Denies  Danger to Others  Danger to Others None reported or observed   Pt denies SI, HI, AVH and pain. Blunted affect. Pt appears to be responding to internal stimuli.

## 2020-02-02 NOTE — Progress Notes (Signed)
Recreation Therapy Notes  Date: 12.10.21 Time: 1000 Location: 500 Hall Dayroom  Group Topic: Wellness  Goal Area(s) Addresses:  Patient will define components of whole wellness. Patient will verbalize benefit of whole wellness.  Intervention: Music  Activity: Exercise.  LRT led patients in a series of stretches to loosen up the body.  Patients were then given the opportunity to lead the group in exercises of their choosing.  The group was to get at least 30 minutes of exercise.  Patients could take breaks and get water as needed.  Education: Wellness, Discharge Planning.   Education Outcome: Acknowledges education/In group clarification offered/Needs additional education.   Clinical Observations/Feedback: Pt did not attend group session.   Aunica Dauphinee, LRT/CTRS        Rafaelita Foister A 02/02/2020 11:40 AM 
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
## 2020-02-02 NOTE — Progress Notes (Signed)
Dar Note: Patient presents with a flat affect and depressed mood.  Denies suicidal thoughts, auditory and visual hallucinations.  Medication given as prescribed.  Routine safety checks maintained.  Patient was sitting in the dark in his room for majority of this shift. Patient is safe on the unit.

## 2020-02-02 NOTE — Progress Notes (Signed)
   02/02/20 1900  Psych Admission Type (Psych Patients Only)  Admission Status Involuntary  Psychosocial Assessment  Patient Complaints Suspiciousness  Eye Contact Brief  Facial Expression Anxious  Affect Anxious;Preoccupied;Blunted  Speech Logical/coherent;Tangential  Interaction Assertive  Motor Activity Fidgety;Pacing;Restless  Appearance/Hygiene Disheveled;Poor hygiene  Behavior Characteristics Cooperative  Mood Preoccupied  Thought Process  Coherency Disorganized;Tangential  Content Paranoia  Delusions None reported or observed  Perception WDL  Hallucination None reported or observed  Judgment Poor  Confusion None  Danger to Self  Current suicidal ideation? Denies  Danger to Others  Danger to Others None reported or observed

## 2020-02-02 NOTE — [facility] Group Notes (Signed)
[facility] LCSW Group Therapy  02/02/2020 1:40 PM  Type of Therapy:  Psychoeducational Skills  Participation Level:  None  Participation Quality:  Drowsy and Intrusive  Affect:  Blunted  Cognitive:  Disorganized, Confused and Lacking  Insight:  Limited and Off Topic  Engagement in Therapy:  None  Modes of Intervention:  Discussion and Rapport Building  Summary of Progress/Problems: Patient attended group, however did not participate in group discussion. Patient at times was intrusive and asking other patients questions that did not make sense and with no context. Patient fell asleep during group.   Shane Allen A [name] 02/02/2020, 1:40 PM

## 2020-02-03 MED ORDER — RISPERIDONE 3 MG PO TABS
6.0000 mg | ORAL_TABLET | Freq: Every day | ORAL | Status: DC
  Administered 2020-02-03: 21:00:00 6 mg via ORAL
  Filled 2020-02-03 (×3): qty 2

## 2020-02-03 MED ORDER — RISPERIDONE 2 MG PO TABS
2.0000 mg | ORAL_TABLET | Freq: Every day | ORAL | Status: DC
  Administered 2020-02-04: 08:00:00 2 mg via ORAL
  Filled 2020-02-03 (×3): qty 1

## 2020-02-03 NOTE — [facility] Group Notes (Signed)
.  Psychoeducational Group Note  Date: 02-03-20 Time: 0900-1000    Goal Setting   Purpose of Group: This group helps to provide patients with the steps of setting a goal that is specific, measurable, attainable, realistic and time specific. A discussion on how we keep ourselves stuck with negative self talk.    Participation Level:  Did not attend   [name]  

## 2020-02-03 NOTE — Progress Notes (Signed)
Adult Psychoeducational Group Note  Date:  02/03/2020 Time:  10:51 PM  Group Topic/Focus:  Wrap-Up Group:   The focus of this group is to help patients review their daily goal of treatment and discuss progress on daily workbooks.  Participation Level:  Minimal  Participation Quality:  Appropriate  Affect:  Appropriate  Cognitive:  Appropriate  Insight: Appropriate  Engagement in Group:  Developing/Improving  Modes of Intervention:  Discussion  Additional Comments:  Pt stated his goal for today was to focus on his treatment plan. Pt stated he accomplished his goal today. Pt stated he was able to talk with his doctor and social worker about his care today. Pt rated his overall day a 10. Pt stated been able to contact his mother today improved his over all day.  Pt stated he felt better about himself today. Pt stated he was able to attend all meals and his appetite was pretty good today. Pt stated he took all medications provided today.  Pt rated sleep last night was fair. Pt stated the goal for tonight is to get some rest. Pt stated he was no physical pain today. Pt deny auditory or visual hallucinations. Pt denies thoughts of harming himself or others. Pt stated he would alert staff if anything changes.  Felipa Furnace 02/03/2020, 10:51 PM

## 2020-02-03 NOTE — Progress Notes (Signed)
°   02/03/20 1000  Psych Admission Type (Psych Patients Only)  Admission Status Involuntary  Psychosocial Assessment  Patient Complaints Suspiciousness  Eye Contact Brief  Facial Expression Anxious  Affect Anxious;Preoccupied;Blunted  Speech Logical/coherent;Tangential  Interaction Assertive  Motor Activity Fidgety;Pacing;Restless  Appearance/Hygiene Disheveled;Poor hygiene  Behavior Characteristics Cooperative  Mood Preoccupied  Thought Process  Coherency Disorganized;Tangential  Content Paranoia  Delusions None reported or observed  Perception WDL  Hallucination None reported or observed  Judgment Poor  Confusion None  Danger to Self  Current suicidal ideation? Denies  Danger to Others  Danger to Others None reported or observed  Dar Note: Patient presents with a flat affect and depressed mood.  Medication given as prescribed.  Attended group and participated.  Patient is safe on the unit.

## 2020-02-03 NOTE — Progress Notes (Addendum)
   02/03/20 2013  Psych Admission Type (Psych Patients Only)  Admission Status Involuntary  Psychosocial Assessment  Patient Complaints None  Eye Contact Brief  Facial Expression Anxious;Flat  Affect Anxious;Blunted  Speech Logical/coherent;Tangential  Interaction Assertive  Motor Activity Fidgety;Pacing;Restless  Appearance/Hygiene Disheveled;Poor hygiene  Behavior Characteristics Cooperative  Mood Preoccupied  Thought Process  Coherency Disorganized;Tangential  Content Paranoia  Delusions None reported or observed  Perception WDL  Hallucination None reported or observed  Judgment Poor  Confusion None  Danger to Self  Current suicidal ideation? Denies  Danger to Others  Danger to Others None reported or observed   Pt denies SI, HI, AVH and pain. Pt has blunted affect.

## 2020-02-03 NOTE — Progress Notes (Signed)
[facility] MD Progress Note  02/03/2020 1:02 PM Shane Allen  MRN:  [id number] Subjective:   Shane Allen is a 34 yr old male who is presents with symptoms of active psychosis in the context of stopping his anti-psychotic medications. PPHx is significant for Schizophrenia, hospitalized in 2018.  Today he continues to be bizarre in his interactions. He displayed circumferential thinking and often returning to answer questions after the conversation had moved on and often blunt with his answers. He reports sleeping well and that his appetite is ok. He reports no SI, HI, or AVH. Discussed that he would be unable to return to his mothers house and that his sister has also stated he could not stay with her. He reported understanding and reporting he had previously stayed at the [facility] and would be fine discharging there. Discussed that social work would need his permission to discus this with the rescue and he was ok with this.  Principal Problem: Schizophrenia (HCC) Diagnosis: Principal Problem:   Schizophrenia (HCC) Active Problems:   Tobacco use disorder   Noncompliance  Total Time spent with patient: 15 minutes  Past Psychiatric History: Patient with history of schizophrenia. Previously hospitalized in 2018 for psychosis. Patient previously on Latuda prior to Risperdal. Prior to stopping his medications, patient was taking Risperdal 3mg  BID  Past Medical History:  Past Medical History:  Diagnosis Date  . Depression   . 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 NO PAST SURGERIES     Family History:  Family History  Problem Relation Age of Onset  . Diabetes Mother    Family Psychiatric  History: Unknown Social History:  Social History   Substance and Sexual Activity  Alcohol Use No   Comment: 4 to 5 beers a day every other week  for sleep     Social History   Substance and Sexual Activity  Drug Use No    Social History   Socioeconomic History  . Marital status: Single    Spouse name: Not on file  . Number of children: Not on file  . Years of education: Not on file  . Highest education level: Not on file  Occupational History  . Not on file  Tobacco Use  . Smoking status: Former Smoker    Packs/day: 1.00    Years: 10.00    Pack years: 10.00    Types: Cigarettes  . Smokeless tobacco: Never Used  Substance and Sexual Activity  . Alcohol use: No    Comment: 4 to 5 beers a day every other week for sleep  . Drug use: No  . Sexual activity: Never    Birth control/protection: None  Other Topics Concern  . Not on file  Social History Narrative  . Not on file   Social Determinants of Health   Financial Resource Strain: Not on file  Food Insecurity: Not on file  Transportation Needs: Not on file  Physical Activity: Not on file  Stress: Not on file  Social Connections: Not on file   Additional Social History:                         Sleep: Fair  Appetite:  Fair  Current Medications: Current Facility-Administered Medications  Medication Dose Route Frequency Provider Last Rate Last Admin  .  alum & mag hydroxide-simeth (MAALOX/MYLANTA) 200-200-20 MG/5ML suspension 30 mL  30 mL Oral Q4H PRN Nira Conn A, NP      . hydrOXYzine (ATARAX/VISTARIL) tablet 25 mg  25 mg Oral TID PRN Jackelyn Poling, NP   25 mg at 02/02/20 2126  . OLANZapine zydis (ZYPREXA) disintegrating tablet 10 mg  10 mg Oral Q8H PRN Estella Husk, MD   10 mg at 01/30/20 1051   And  . LORazepam (ATIVAN) tablet 1 mg  1 mg Oral PRN Estella Husk, MD       And  . ziprasidone (GEODON) injection 20 mg  20 mg Intramuscular PRN Estella Husk, MD      . magnesium hydroxide (MILK OF MAGNESIA) suspension 30 mL  30 mL Oral Daily PRN Nira Conn A, NP      . nicotine (NICODERM CQ - dosed in mg/24 hours) patch 21  mg  21 mg Transdermal Daily McQuilla, Gerlean Ren B, MD      . nicotine polacrilex (NICORETTE) gum 2 mg  2 mg Oral PRN Estella Husk, MD      . risperiDONE (RISPERDAL M-TABS) disintegrating tablet 4 mg  4 mg Oral BID Estella Husk, MD   4 mg at 02/03/20 0753  . traZODone (DESYREL) tablet 50 mg  50 mg Oral QHS PRN Jackelyn Poling, NP   50 mg at 02/02/20 2126    Lab Results: No results found for this or any previous visit (from the past 48 hour(s)).  Blood Alcohol level:  Lab Results  Component Value Date   ETH <10 01/28/2020   ETH <10 11/27/2018    Metabolic Disorder Labs: Lab Results  Component Value Date   HGBA1C 5.3 01/30/2020   MPG 105.41 01/30/2020   MPG 108.28 01/19/2017   Lab Results  Component Value Date   PROLACTIN 21.7 (H) 08/18/2015   Lab Results  Component Value Date   CHOL 170 01/30/2020   TRIG 57 01/30/2020   HDL 45 01/30/2020   CHOLHDL 3.8 01/30/2020   VLDL 11 01/30/2020   LDLCALC 114 (H) 01/30/2020   LDLCALC 94 01/19/2017    Physical Findings: AIMS: Facial and Oral Movements Muscles of Facial Expression: None, normal Lips and Perioral Area: None, normal Jaw: None, normal Tongue: None, normal,Extremity Movements Upper (arms, wrists, hands, fingers): None, normal Lower (legs, knees, ankles, toes): None, normal, Trunk Movements Neck, shoulders, hips: None, normal, Overall Severity Severity of abnormal movements (highest score from questions above): None, normal Incapacitation due to abnormal movements: None, normal Patient's awareness of abnormal movements (rate only patient's report): No Awareness, Dental Status Current problems with teeth and/or dentures?: No Does patient usually wear dentures?: No  CIWA:    COWS:     Musculoskeletal: Strength & Muscle Tone: within normal limits Gait & Station: normal Patient leans: N/A  Psychiatric Specialty Exam: Physical Exam Nursing note reviewed. Vitals reviewed: Vitals not taken.  Constitutional:       General: He is not in acute distress.    Appearance: Normal appearance. He is normal weight. He is not ill-appearing, toxic-appearing or diaphoretic.  HENT:     Head: Normocephalic and atraumatic.  Pulmonary:     Effort: Pulmonary effort is normal.  Musculoskeletal:        General: Normal range of motion.  Neurological:     General: No focal deficit present.     Mental Status: He is alert.     Review of Systems  Constitutional: Negative for fatigue  and fever.  Respiratory: Negative for chest tightness and shortness of breath.   Cardiovascular: Negative for chest pain and palpitations.  Gastrointestinal: Negative for abdominal pain, constipation, diarrhea, nausea and vomiting.  Neurological: Negative for dizziness, weakness, light-headedness and headaches.  Psychiatric/Behavioral: Negative for agitation, self-injury, sleep disturbance and suicidal ideas. The patient is not nervous/anxious.     Blood pressure (!) 128/93, pulse 84, temperature (!) 97.4 F (36.3 C), temperature source Oral, resp. rate 18, height 5\' 11"  (1.803 m), weight 95.3 kg, SpO2 98 %.Body mass index is 29.29 kg/m.  General Appearance: Bizarre and Disheveled  Eye Contact:  Minimal  Speech:  Blocked and Slow  Volume:  Normal  Mood:  Euphoric  Affect:  Non-Congruent and Restricted  Thought Process:  Disorganized  Orientation:  Full (Time, Place, and Person)  Thought Content:  Rumination and Circumfrential  Suicidal Thoughts:  No  Homicidal Thoughts:  No  Memory:  Immediate;   Fair Recent;   Fair  Judgement:  Poor  Insight:  Shallow  Psychomotor Activity:  Normal  Concentration:  Concentration: Fair and Attention Span: Fair  Recall:  of Knowledge:  Fair  Language:  Good  Akathisia:  Negative  Handed:  Right  AIMS (if indicated):     Assets:  Resilience  ADL's:  Intact  Cognition:  WNL  Sleep:  Number of Hours: 8.25     Treatment Plan Summary: Daily contact with patient to assess and  evaluate symptoms and progress in treatment  He continues to act bizarre and display disorganized thinking with thought blocking. Possibly responding to internal stimuli because he would have long pauses before answering questions and sometimes returning to previous questions after conversation had moved on. Will change his Risperdal to regular tablets and change the dosing to 2 mg QAM and 6 mg QHS  -Start Risperdal 2 mg QAM and 6 mg QHS -Continue Nicoderm 21 mg and Nicorette gum 2 mg  -Continue PRN: Maalox, Atarax, Trazodone, Agitation Protocol (Zyprexa, Ativan, Geodon)  [employer], MD 02/03/2020, 1:02 PM

## 2020-02-03 NOTE — [facility] Group Notes (Signed)
  [facility] LCSW Group Therapy Note  Date/Time:  02/03/2020 11:15AM-12:00PM  Type of Therapy and Topic:  Group Therapy:  Feelings About Hospitalization  Participation Level:  Active   Description of Group This process group involved patients discussing their feelings related to being hospitalized, as well as the benefits they see to being in the hospital.  These feelings and benefits were itemized.  The group then brainstormed specific ways in which they could seek those same benefits when they discharge and return home.  Therapeutic Goals 1. Patient will identify and describe positive and negative feelings related to hospitalization 2. Patient will verbalize benefits of hospitalization to themselves personally 3. Patients will brainstorm together ways they can obtain similar benefits in the outpatient setting, identify barriers to wellness and possible solutions  Summary of Patient Progress:  The patient expressed his primary feelings about being hospitalized are "what you don't know, you do know and it becomes second nature to you."  He stated his method of staying out of the hospital in the future will be to "learn from my mistakes and stay on my own path."  He stated that he needs to get a job to "make ends meet."  When asked what he will do for fun outside the hospital, he stated "Work."  Therapeutic Modalities Cognitive Behavioral Therapy Motivational Interviewing    Ambrose Mantle, LCSW 02/03/2020, 12:57 PM

## 2020-02-04 MED ORDER — RISPERIDONE 2 MG PO TABS
2.0000 mg | ORAL_TABLET | Freq: Every morning | ORAL | Status: AC
  Administered 2020-02-05: 08:00:00 2 mg via ORAL
  Filled 2020-02-04: qty 1

## 2020-02-04 MED ORDER — ZIPRASIDONE HCL 40 MG PO CAPS
40.0000 mg | ORAL_CAPSULE | Freq: Every day | ORAL | Status: DC
  Administered 2020-02-04: 17:00:00 40 mg via ORAL
  Filled 2020-02-04 (×2): qty 1

## 2020-02-04 MED ORDER — RISPERIDONE 1 MG PO TABS
1.0000 mg | ORAL_TABLET | Freq: Every morning | ORAL | Status: DC
  Filled 2020-02-04: qty 1

## 2020-02-04 MED ORDER — RISPERIDONE 2 MG PO TABS
2.0000 mg | ORAL_TABLET | Freq: Every day | ORAL | Status: DC
  Administered 2020-02-04: 21:00:00 2 mg via ORAL
  Filled 2020-02-04 (×2): qty 1

## 2020-02-04 NOTE — Progress Notes (Signed)
Adult Psychoeducational Group Note  Date:  02/04/2020 Time:  8:38 PM  Group Topic/Focus:  Wrap-Up Group:   The focus of this group is to help patients review their daily goal of treatment and discuss progress on daily workbooks.  Participation Level:  Did Not Attend  Participation Quality:  Did Not Attend  Affect:  Did Not Attend  Cognitive:  Did Not Attend  Insight: None  Engagement in Group:  Did Not Attend  Modes of Intervention:  Did Not Attend  Additional Comments:  Pt did not attend evening wrap up group tonight.  Felipa Furnace 02/04/2020, 8:38 PM

## 2020-02-04 NOTE — Progress Notes (Signed)
[facility] MD Progress Note  02/04/2020 1:54 PM Shane Allen  MRN:  [id number] Subjective:  Patient reports that he is "good" this AM. Patient reports that he is eating and sleeping well. Patient reports that he does not here God speaking to him directly anymore. Upon further questioning the patient reports that he does believe God has given him the power to heal others as a [place]; although he reports that his healing abilities are relative to the others person faith in God. Patient reports that he is no longer having trouble taking his medications, but he is still having some drowsiness immediatly after taking the medications, but he is able to keep himself awake. Patient also reports that he does continue to have feelings of restlessness and that he has to continue to move in order to feel better. Patient contracted for safety today.  Later this day: Patient hit the STARR button. Upon going to see patient he reported he just needed to push the button essentially to see what would happened. Patient was very nonsensical in his reasoning as to why he hit the button and his speech was more rapid and circumferential than on interview in the AM. Patient was not agitated but did seem a bit anxious.  Principal Problem: Schizophrenia (HCC) Diagnosis: Principal Problem:   Schizophrenia (HCC) Active Problems:   Tobacco use disorder   Noncompliance  Total Time spent with patient: 30 minutes  Past Psychiatric History: See H&P  Past Medical History:  Past Medical History:  Diagnosis Date  . Depression   . 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  [name] affective schizophrenia [facility])     Past Surgical History:  Procedure Laterality Date  . NO PAST SURGERIES     Family History:  Family History  Problem Relation Age of Onset  . Diabetes Mother    Family Psychiatric  History: See H&P Social History:  Social History   Substance and  Sexual Activity  Alcohol Use No   Comment: 4 to 5 beers a day every other week for sleep     Social History   Substance and Sexual Activity  Drug Use No    Social History   Socioeconomic History  . Marital status: Single    Spouse name: Not on file  . Number of children: Not on file  . Years of education: Not on file  . Highest education level: Not on file  Occupational History  . Not on file  Tobacco Use  . Smoking status: Former Smoker    Packs/day: 1.00    Years: 10.00    Pack years: 10.00    Types: Cigarettes  . Smokeless tobacco: Never Used  Substance and Sexual Activity  . Alcohol use: No    Comment: 4 to 5 beers a day every other week for sleep  . Drug use: No  . Sexual activity: Never    Birth control/protection: None  Other Topics Concern  . Not on file  Social History Narrative  . Not on file   Social Determinants of Health   Financial Resource Strain: Not on file  Food Insecurity: Not on file  Transportation Needs: Not on file  Physical Activity: Not on file  Stress: Not on file  Social Connections: Not on file   Additional Social History:  Sleep: Fair  Appetite:  Good  Current Medications: Current Facility-Administered Medications  Medication Dose Route Frequency Provider Last Rate Last Admin  . alum & mag hydroxide-simeth (MAALOX/MYLANTA) 200-200-20 MG/5ML suspension 30 mL  30 mL Oral Q4H PRN Nira Conn A, NP      . hydrOXYzine (ATARAX/VISTARIL) tablet 25 mg  25 mg Oral TID PRN Jackelyn Poling, NP   25 mg at 02/02/20 2126  . OLANZapine zydis (ZYPREXA) disintegrating tablet 10 mg  10 mg Oral Q8H PRN Estella Husk, MD   10 mg at 01/30/20 1051   And  . LORazepam (ATIVAN) tablet 1 mg  1 mg Oral PRN Estella Husk, MD       And  . ziprasidone (GEODON) injection 20 mg  20 mg Intramuscular PRN Estella Husk, MD      . magnesium hydroxide (MILK OF MAGNESIA) suspension 30 mL  30 mL Oral Daily PRN  Nira Conn A, NP      . nicotine (NICODERM CQ - dosed in mg/24 hours) patch 21 mg  21 mg Transdermal Daily Dorrian Doggett, Gerlean Ren B, MD      . nicotine polacrilex (NICORETTE) gum 2 mg  2 mg Oral PRN Estella Husk, MD   2 mg at 02/04/20 1141  . [START ON 02/05/2020] risperiDONE (RISPERDAL) tablet 2 mg  2 mg Oral q AM Mariel Craft, MD       Followed by  . [START ON 02/06/2020] risperiDONE (RISPERDAL) tablet 1 mg  1 mg Oral q AM Mariel Craft, MD      . risperiDONE (RISPERDAL) tablet 2 mg  2 mg Oral q1800 Mariel Craft, MD      . traZODone (DESYREL) tablet 50 mg  50 mg Oral QHS PRN Nira Conn A, NP   50 mg at 02/02/20 2126  . ziprasidone (GEODON) capsule 40 mg  40 mg Oral Q supper Mariel Craft, MD        Lab Results: No results found for this or any previous visit (from the past 48 hour(s)).  Blood Alcohol level:  Lab Results  Component Value Date   ETH <10 01/28/2020   ETH <10 11/27/2018    Metabolic Disorder Labs: Lab Results  Component Value Date   HGBA1C 5.3 01/30/2020   MPG 105.41 01/30/2020   MPG 108.28 01/19/2017   Lab Results  Component Value Date   PROLACTIN 21.7 (H) 08/18/2015   Lab Results  Component Value Date   CHOL 170 01/30/2020   TRIG 57 01/30/2020   HDL 45 01/30/2020   CHOLHDL 3.8 01/30/2020   VLDL 11 01/30/2020   LDLCALC 114 (H) 01/30/2020   LDLCALC 94 01/19/2017    Physical Findings: AIMS: Facial and Oral Movements Muscles of Facial Expression: None, normal Lips and Perioral Area: None, normal Jaw: None, normal Tongue: None, normal,Extremity Movements Upper (arms, wrists, hands, fingers): None, normal Lower (legs, knees, ankles, toes): None, normal, Trunk Movements Neck, shoulders, hips: None, normal, Overall Severity Severity of abnormal movements (highest score from questions above): None, normal Incapacitation due to abnormal movements: None, normal Patient's awareness of abnormal movements (rate only patient's report): No  Awareness, Dental Status Current problems with teeth and/or dentures?: No Does patient usually wear dentures?: No  CIWA:    COWS:     Musculoskeletal: Strength & Muscle Tone: within normal limits Gait & Station: normal Patient leans: N/A  Psychiatric Specialty Exam: Physical Exam HENT:     Head: Normocephalic.  Pulmonary:  Effort: Pulmonary effort is normal.  Neurological:     Mental Status: He is alert.     Review of Systems  Constitutional: Negative for appetite change.  Psychiatric/Behavioral: Negative for self-injury and sleep disturbance.    Blood pressure (!) 128/93, pulse 84, temperature (!) 97.4 F (36.3 C), temperature source Oral, resp. rate 18, height 5\' 11"  (1.803 m), weight 95.3 kg, SpO2 98 %.Body mass index is 29.29 kg/m.  General Appearance: Bizarre Patient did change clothes this afternoon after it was brought up in AM interview. Patient was more responsive to wearing hospital clothing today and was seen in a gown after his STARR episode.   Eye Contact:  Minimal  Speech:  Clear and Coherent, more pressured in the afternoon  Volume:  Normal  Mood:  "good" appeared more anxious when talking to patient about hitting the STARR button but was also eating chips and responsive to conversation and request to not hit the button again unless necessary  Affect:  Flat  Thought Process:  Linear, circumferential in the PM  Orientation:  Full (Time, Place, and Person)  Thought Content:  Delusions  Suicidal Thoughts:  No  Homicidal Thoughts:  No  Memory:  Recent;   Poor  Judgement:  Impaired  Insight:  Lacking  Psychomotor Activity:  Normal  Concentration:  Concentration: Poor  Recall:  NA  Fund of Knowledge:  NA  Language:  Fair  Akathisia:  No  Handed:  Right  AIMS (if indicated):     Assets:  Leisure Time  ADL's:  Intact  Cognition:  Impaired,  Mild  Sleep:  Number of Hours: 7.75     Treatment Plan Summary: Daily contact with patient to assess and  evaluate symptoms and progress in treatment  Mr. Vu is a 34 yo patient w/ hx of schizophrenia who continues to appear psychotic on the unit. Patient appears to show improvement from admission with decrease HI, paranoia, and he is becoming less preoccupied with his delusions. However, the patient continues to exhibit bizarre behavior on the unit. Patient has now hit the STARR button 3 times out of impulse. On evaluation after the most recent incident patient appears more psychotic with very circumferential thoughts and nonsensical explanations with rapid speech. Patient also continues to endorse religious delusions and has very poor insight into why he is hospitalized. Patient appears to need his medications readjusted as he has improved with medications, but continues to be psychotic. Will begin a cross taper from Risperdal to Geodon. Patient may require both medications. Will monitor his reactions and behavior during the cross taper. He has failed multiple antipsychotic in the past. List of tried and failed antipsychotics includes:  Saphris, Zyprexa, Abilify, Invega, 31, Seroquel, and [place]. Patient has also had Temzepam, Zoloft, Venlafaxine, Cymbalta, depakote, congentin and amatadine.With so many previous antipsychotic failures patient may require 2 antipsychotics for therapy.  Schizophrenia - Cross taper: 12/12: 4mg  risperdal (2mg  AM+ 2mg  QHS) w/ 40mg  Geodon@supper  12/13: 2 mg risperdal AM w/ [place] (adjust mg if needed) 12/14: 1mg  risperdal Am w/ Geodon adjusted as needed - Monitor for EPS symptoms as patient has reported Akathisia in the past - EKG in AM  PRN - Nicorette gum -Maalox 57ml q4h -Atarax 25mg  TID -Milk of Mag 30mL -Trazodone 50mg  QHS Agitation Protocol: Olanzapine 10mg  q8h Ativan 1mg   Geodon 20mg  PRN PGY-1 , MD 02/04/2020, 1:54 PM

## 2020-02-04 NOTE — [facility] Group Notes (Signed)
[facility] LCSW Group Therapy Note  Date/Time:  02/04/2020  11:00AM-12:00PM  Type of Therapy and Topic:  Group Therapy:  Music and Mood  Participation Level:  Minimal   Description of Group: In this process group, members listened to a variety of genres of music and identified that different types of music evoke different responses.  Patients were encouraged to identify music that was soothing for them and music that was energizing for them.  Patients discussed how this knowledge can help with wellness and recovery in various ways including managing depression and anxiety as well as encouraging healthy sleep habits.    Therapeutic Goals: Patients will explore the impact of different varieties of music on mood Patients will verbalize the thoughts they have when listening to different types of music Patients will identify music that is soothing to them as well as music that is energizing to them Patients will discuss how to use this knowledge to assist in maintaining wellness and recovery Patients will explore the use of music as a coping skill  Summary of Patient Progress:  At the beginning of group, patient expressed that he felt "slurrious."  He interrupted group numerous times to ask off-topic questions and was dismissive of the purpose of group.  Group was terminated early because he and several other group members were disruptive and could not be brought back on the topic of how the music made them feel, would not stop talking over the music, and kept requesting songs and insisting on hearing those songs.  Therapeutic Modalities: Solution Focused Brief Therapy Activity   Ambrose Mantle, LCSW

## 2020-02-04 NOTE — Progress Notes (Signed)
Pt denies SI, HI, AVH and AVH when assessed. Presents with blunted affect, fair but brief eye contact, clear but tangential speech. Ambulatory with a steady gait, However, pt remains intrusive with intermittent confusion turning on code STARR light X 2 this shift, stating "I'm just testing out my fears" when staff responded to his room. Observed throwing balls of papers into peers rooms (503 & 501) "not a big deal just papers". Pt continues to need frequent verbal redirections. Mother called this afternoon to inform writer that pt was on Risperdal for 6-8 months. He was weaned off for approximately 3 months now, was initially experiencing "automatic jerking when they took him off. I just want to let y'all know". Pt states he's sleeping well and tolerates all PO intake well. Support and encouragement offered. Medications given with verbal education and effects monitored. Q 15 minutes safety checks maintained without self harm gestures. Continued verbal redirections offered for intrusive behaviors.  Pt remains safe on and off unit. Denies concerns at this time. Showered and wash his clothes.

## 2020-02-04 NOTE — Progress Notes (Signed)
D: Patient presents with blunted affect and is minimal upon interaction. Patient is cooperative and medication compliant at this time. Patient denies SI/HI at this time. Patient also denies AH/VH at this time. Patient contracts for safety.  A: Provided positive reinforcement and encouragement.  R: Patient cooperative and receptive to efforts. Patient remains safe on the unit.   02/04/20 2052  Psych Admission Type (Psych Patients Only)  Admission Status Involuntary  Psychosocial Assessment  Patient Complaints None  Eye Contact Fair  Facial Expression Blank  Affect Blunted  Speech Logical/coherent  Interaction Minimal  Motor Activity Fidgety  Appearance/Hygiene Improved  Behavior Characteristics Appropriate to situation;Cooperative  Mood Pleasant  Thought Process  Coherency Unable to assess  Content UTA  Delusions None reported or observed  Perception WDL  Hallucination None reported or observed  Judgment Poor  Confusion None  Danger to Self  Current suicidal ideation? Denies  Danger to Others  Danger to Others None reported or observed

## 2020-02-04 NOTE — Progress Notes (Signed)
The focus of this group is to help patients establish daily goals to achieve during treatment and discuss how the patient can incorporate goal setting into their daily lives to aide in recovery. The patient attend groups and participated in activities.

## 2020-02-05 ENCOUNTER — Telehealth (HOSPITAL_COMMUNITY): Payer: Self-pay

## 2020-02-05 ENCOUNTER — Other Ambulatory Visit (HOSPITAL_COMMUNITY): Payer: Self-pay | Admitting: Psychiatry

## 2020-02-05 MED ORDER — PALIPERIDONE ER 3 MG PO TB24
3.0000 mg | ORAL_TABLET | Freq: Two times a day (BID) | ORAL | Status: DC
  Administered 2020-02-05 – 2020-02-06 (×3): 3 mg via ORAL
  Filled 2020-02-05 (×5): qty 1

## 2020-02-05 MED ORDER — PALIPERIDONE PALMITATE ER 234 MG/1.5ML IM SUSY
234.0000 mg | PREFILLED_SYRINGE | Freq: Once | INTRAMUSCULAR | Status: AC
  Administered 2020-02-06: 13:00:00 234 mg via INTRAMUSCULAR
  Filled 2020-02-05: qty 1.5

## 2020-02-05 NOTE — Progress Notes (Signed)
Adult Psychoeducational Group Note  Date:  02/05/2020 Time:  10:17 PM  Group Topic/Focus:  Wrap-Up Group:   The focus of this group is to help patients review their daily goal of treatment and discuss progress on daily workbooks.  Participation Level:  Active  Participation Quality:  Appropriate  Affect:  Appropriate  Cognitive:  Disorganized and Confused  Insight: Appropriate  Engagement in Group:  Limited  Modes of Intervention:  Discussion  Additional Comments:   Pt stated his goal for today was to focus on his treatment plan. Pt stated he accomplished his goal today. Pt stated he was able to talk with his doctor and social worker about his care today. Pt rated his overall day a 10. Pt stated been able to contact his next placement to confirm placement admission date improved his day.  Pt stated he felt better about himself today. Pt stated he was able to attend all meals and his appetite was pretty good today. Pt stated he took all medications provided today.  Pt rated sleep last night was good. Pt stated the goal for tonight is to get some rest. Pt stated he was no physical pain today. Pt deny auditory or visual hallucinations. Pt denies thoughts of harming himself or others. Pt stated he would alert staff if anything changes.  Felipa Furnace 02/05/2020, 10:17 PM

## 2020-02-05 NOTE — [facility] Group Notes (Signed)
The focus of this group is to help patients establish daily goals to achieve during treatment and discuss how the patient can incorporate goal setting into their daily lives to aide in recovery.    Pt participated in group  

## 2020-02-05 NOTE — Telephone Encounter (Signed)
Care Management - Follow Up [facility] Discharges   Writer attempted to make contact with patient today and was unsuccessful.    Per chart review, patient is still inpatient at [facility] at [facility].

## 2020-02-05 NOTE — Progress Notes (Signed)
[facility] MD Progress Note  02/05/2020 9:24 AM Shane Allen  MRN:  [id number]   Subjective: Patient reports that he is "good" this AM and slept "good". On questioning, patient initially denies hitting STARR button on 12/12 then subsequently recalls that he hit it because he was sleeping and woke up to see someone in his room with "long short hair". Patient states he spoke to his mother on the phone today and they got in an argument about taking his medications and he hung up on her. Per nursing his mother has called the facility and reported that the patient has called numerous times saying he is coming home. Patient was notified and reported understanding that he is not allowed back at his mother's home. He states that God last spoke to him this morning while he was on the phone with his mother and told him the plan was to "get out of here and don't come back".  During the interview, the patient reached out and hit the clipboard of the interviewer. When asked why he did that, the patient stated it was an impulse that he could not control. Patient denies SI, HI today. Denies feelings of restlessness.  Principal Problem: Schizophrenia (HCC) Diagnosis: Principal Problem:   Schizophrenia (HCC) Active Problems:   Tobacco use disorder   Noncompliance  Total Time spent with patient: 15 minutes  Past Psychiatric History: Patient with history of schizophrenia. Previously hospitalized in 2018 for psychosis. Per records, has previously tried and failed Haldol, Navane, Abilify, Latuda, Invega sustenna, Seroquel, Saphris, Zyprexa, and Rispderal.   Past Medical History:  Past Medical History:  Diagnosis Date  . Depression   . 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  [name] affective schizophrenia [facility])     Past Surgical History:  Procedure Laterality Date  . NO PAST SURGERIES     Family History:  Family History  Problem Relation Age of  Onset  . Diabetes Mother    Family Psychiatric  History: See H&P Social History:  Social History   Substance and Sexual Activity  Alcohol Use No   Comment: 4 to 5 beers a day every other week for sleep     Social History   Substance and Sexual Activity  Drug Use No    Social History   Socioeconomic History  . Marital status: Single    Spouse name: Not on file  . Number of children: Not on file  . Years of education: Not on file  . Highest education level: Not on file  Occupational History  . Not on file  Tobacco Use  . Smoking status: Former Smoker    Packs/day: 1.00    Years: 10.00    Pack years: 10.00    Types: Cigarettes  . Smokeless tobacco: Never Used  Substance and Sexual Activity  . Alcohol use: No    Comment: 4 to 5 beers a day every other week for sleep  . Drug use: No  . Sexual activity: Never    Birth control/protection: None  Other Topics Concern  . Not on file  Social History Narrative  . Not on file   Social Determinants of Health   Financial Resource Strain: Not on file  Food Insecurity: Not on file  Transportation Needs: Not on file  Physical Activity: Not on file  Stress: Not on file  Social Connections: Not on file   Additional Social History:   Sleep: Good  Appetite:  Good  Current Medications: Current Facility-Administered Medications  Medication Dose Route Frequency Provider Last Rate Last Admin  . alum & mag hydroxide-simeth (MAALOX/MYLANTA) 200-200-20 MG/5ML suspension 30 mL  30 mL Oral Q4H PRN Nira Conn A, NP      . hydrOXYzine (ATARAX/VISTARIL) tablet 25 mg  25 mg Oral TID PRN Jackelyn Poling, NP   25 mg at 02/02/20 2126  . OLANZapine zydis (ZYPREXA) disintegrating tablet 10 mg  10 mg Oral Q8H PRN Estella Husk, MD   10 mg at 01/30/20 1051   And  . LORazepam (ATIVAN) tablet 1 mg  1 mg Oral PRN Estella Husk, MD       And  . ziprasidone (GEODON) injection 20 mg  20 mg Intramuscular PRN Estella Husk, MD       . magnesium hydroxide (MILK OF MAGNESIA) suspension 30 mL  30 mL Oral Daily PRN Nira Conn A, NP      . nicotine (NICODERM CQ - dosed in mg/24 hours) patch 21 mg  21 mg Transdermal Daily Kenasia Scheller, Gerlean Ren B, MD      . nicotine polacrilex (NICORETTE) gum 2 mg  2 mg Oral PRN Estella Husk, MD   2 mg at 02/05/20 0801  . [START ON 02/06/2020] risperiDONE (RISPERDAL) tablet 1 mg  1 mg Oral q AM Mariel Craft, MD      . risperiDONE (RISPERDAL) tablet 2 mg  2 mg Oral q1800 Mariel Craft, MD   2 mg at 02/04/20 2052  . traZODone (DESYREL) tablet 50 mg  50 mg Oral QHS PRN Nira Conn A, NP   50 mg at 02/02/20 2126  . ziprasidone (GEODON) capsule 40 mg  40 mg Oral Q supper Mariel Craft, MD   40 mg at 02/04/20 1723    Lab Results: No results found for this or any previous visit (from the past 48 hour(s)).  Blood Alcohol level:  Lab Results  Component Value Date   ETH <10 01/28/2020   ETH <10 11/27/2018    Metabolic Disorder Labs: Lab Results  Component Value Date   HGBA1C 5.3 01/30/2020   MPG 105.41 01/30/2020   MPG 108.28 01/19/2017   Lab Results  Component Value Date   PROLACTIN 21.7 (H) 08/18/2015   Lab Results  Component Value Date   CHOL 170 01/30/2020   TRIG 57 01/30/2020   HDL 45 01/30/2020   CHOLHDL 3.8 01/30/2020   VLDL 11 01/30/2020   LDLCALC 114 (H) 01/30/2020   LDLCALC 94 01/19/2017    Physical Findings: AIMS: Facial and Oral Movements Muscles of Facial Expression: None, normal Lips and Perioral Area: None, normal Jaw: None, normal Tongue: None, normal,Extremity Movements Upper (arms, wrists, hands, fingers): None, normal Lower (legs, knees, ankles, toes): None, normal, Trunk Movements Neck, shoulders, hips: None, normal, Overall Severity Severity of abnormal movements (highest score from questions above): None, normal Incapacitation due to abnormal movements: None, normal Patient's awareness of abnormal movements (rate only patient's report):  No Awareness, Dental Status Current problems with teeth and/or dentures?: No Does patient usually wear dentures?: No  CIWA:    COWS:     Musculoskeletal: Strength & Muscle Tone: within normal limits Gait & Station: normal Patient leans: N/A  Psychiatric Specialty Exam: Physical Exam Constitutional:      General: He is not in acute distress. HENT:     Head: Normocephalic.  Pulmonary:     Effort: No respiratory distress.  Skin:    Coloration:  Skin is not jaundiced.     Review of Systems  Constitutional: Negative for appetite change.  Psychiatric/Behavioral: Negative for self-injury, sleep disturbance and suicidal ideas.    Blood pressure 126/78, pulse 84, temperature (!) 97.4 F (36.3 C), temperature source Oral, resp. rate 18, height 5\' 11"  (1.803 m), weight 95.3 kg, SpO2 98 %.Body mass index is 29.29 kg/m.  General Appearance: Disheveled - again wearing same clothing that he's worn since admission. Patient states they were cleaned yesterday. Appears less sedated and sleepy today.  Eye Contact:  Fair  Speech:  Clear and Coherent, increased rate compared to last week  Volume:  Normal  Mood:  "good"  Affect:  Flat  Thought Process:  Disorganized and Irrelevant  Orientation:  NA  Thought Content:  Delusions and hallucinations  Suicidal Thoughts:  No  Homicidal Thoughts:  No  Memory:  Recent;   Poor  Judgement:  Impaired  Insight:  Lacking  Psychomotor Activity:  Normal  Concentration:  Concentration: Poor  Recall:  NA  Fund of Knowledge:  NA  Language:  Fair  Akathisia:  No  Handed:  Right  AIMS (if indicated):     Assets:  Leisure Time  ADL's:  Intact  Cognition:  Impaired,  Mild  Sleep:  Number of Hours: 6.75     Treatment Plan Summary: Daily contact with patient to assess and evaluate symptoms and progress in treatment and Medication management  Shane Allen is a 34 yo patient w/ hx of schizophrenia who continues to appear psychotic on the unit. Patient was  appearing to show improvement from admission with decreased HI, paranoia, and preoccupation with delusions. However on interview today, patient seems more impulsive, strongly endorsed religious delusions, and continues to show poor insight into hospitalizations. Patient is also endorsing what appear to be hallucinations overnight.  Patient hit STARR button multiple times over the last several days. Patient continues to have trouble with impulse control. He displayed impulse this AM but randomly touching the Medical Student's clipboard. He still exhibits psychotic behavior at this time and requires further adjustments of his medications. We will discontinue current cross-taper as patient was showing worsening of symptoms this AM. Patient is reporting that he did well on 31 and there are decreased hospitalizations around the time he was compliant with this medication.   He has failed multiple antipsychotic in the past. List of tried and failed antipsychotics includes:  Saphris, Zyprexa, Abilify, Invega, [place], Seroquel, and [place]. Patient has also had Temzepam, Zoloft, Venlafaxine, Cymbalta, depakote, congentin and amatadine.   Schizophrenia - Jordan Sustenna 234mg  - Invega 3mg  PO - Monitor for EPS symptoms as patient has reported Akathisia in the past   PRN - Nicorette gum - Maalox 57ml q4h - Atarax 25mg  TID - Milk of Mag 37mL - Trazodone 50mg  QHS - Agitation Protocol:  Olanzapine 10mg  q8h  Ativan 1mg    Geodon 20mg  PRN  , Medical Student 02/05/2020, 9:24 AM   Attestation for Student Documentation:  I personally was present and performed or re-performed the history, physical exam and medical decision-making activities of this service and have verified that the service and findings are accurately documented in the student's note.  , MD 02/05/2020, 12:06 PM

## 2020-02-05 NOTE — Progress Notes (Signed)
Recreation Therapy Notes  Date: 12.13.21 Time: 1005 Location: 500 Hall Dayroom   Group Topic: Communication, Team Building, Problem Solving  Goal Area(s) Addresses:  Patient will effectively work with peer towards shared goal.  Patient will identify skills used to make activity successful.  Patient will share challenges and verbalize solution-driven approaches used. Patient will identify how skills used during activity can be used to reach post d/c goals.   Behavioral Response:  Attentive  Intervention: STEM Activity   Activity: [employer]. Patients were provided the following materials: 2 drinking straws, 5 rubber bands, 5 paper clips, 2 index cards and 2 drinking cups. Using the provided materials patients were asked to build a launching mechanism to launch a ping pong ball across the room, approximately 10 feet. Patients were divided into teams of 3-5. Instructions required all materials be incorporated into the device, functionality of items left to the peer group's discretion.  Education: Pharmacist, community, Scientist, physiological, Air cabin crew, Building control surveyor.   Education Outcome: Acknowledges education/In group clarification offered/Needs additional education.   Clinical Observations/Feedback: Pt was able to sit through the entire group without going in and out.  Pt was bright and social with peers.  Pt made some suggestions and tried to help put launcher together at times.  Pt was appropriate and attentive during group session.      Caroll Rancher, LRT/CTRS     Caroll Rancher A 02/05/2020 12:05 PM

## 2020-02-05 NOTE — [facility] Group Notes (Signed)
[facility] LCSW Group Therapy  02/05/2020 3:42 PM   The focus of this group was to explore self compassion, awareness, and ways to practice self-compassion in daily life.   Type of Therapy:  Group Therapy  Participation Level:  Active  Participation Quality:  Appropriate  Affect:  Appropriate  Cognitive:  Confused  Insight:  Developing/Improving  Engagement in Therapy:  Improving  Modes of Intervention:  Activity, Education and Exploration  Summary of Progress/Problems: Patient attended and participated in group. Patient shared an experience of when someone showed him compassion and was able to relate this to showing self compassion to himself.  Tamaya Pun A Amayia Ciano 02/05/2020, 3:42 PM

## 2020-02-05 NOTE — Progress Notes (Addendum)
   02/05/20 0751  Vital Signs  Pulse Rate 84  BP 126/78  BP Method Automatic   D: Patient denies SI/HI/AVH. Patient denies anxiety and depression. Pt. Out in open areas, social with peers and staff. Pt. Attended group. Pt. approached nurses station and started talking to staff. Pt. Was not making sense, talking about roofing and had fragmented thoughts.   A:  Patient took scheduled medicine.  Support and encouragement provided Routine safety checks conducted every 15 minutes. Patient  Informed to notify staff with any concerns.    R: Safety maintained.  Note from phone call from mother @ 1200:  Pt.'s mother  Tyler Aas 71y.0.) called. Pt. is calling( multiple times) and stating that 'he's coming home today' Pt is hanging up on his mom. Pt. is not making sense in conversation. Mom is saying that he cannot come to her house. IF you have any questions please call her Tyler Aas, landline [phone] cell phone [phone]. Mom claims that old  Dr. [name] with [facility]  in [place] put him on risperdal and "some other for sider-effects" this medicine made him have severe sporadic involuntary movements.

## 2020-02-05 NOTE — Tx Team (Signed)
Interdisciplinary Treatment and Diagnostic Plan Update  02/05/2020 Time of Session: 8:55am Shane Allen MRN: [id number]  Principal Diagnosis: Schizophrenia [facility])  Secondary Diagnoses: Principal Problem:   Schizophrenia (HCC) Active Problems:   Tobacco use disorder   Noncompliance   Current Medications:  Current Facility-Administered Medications  Medication Dose Route Frequency Provider Last Rate Last Admin  . alum & mag hydroxide-simeth (MAALOX/MYLANTA) 200-200-20 MG/5ML suspension 30 mL  30 mL Oral Q4H PRN Nira Conn A, NP      . hydrOXYzine (ATARAX/VISTARIL) tablet 25 mg  25 mg Oral TID PRN Jackelyn Poling, NP   25 mg at 02/02/20 2126  . OLANZapine zydis (ZYPREXA) disintegrating tablet 10 mg  10 mg Oral Q8H PRN Estella Husk, MD   10 mg at 01/30/20 1051   And  . LORazepam (ATIVAN) tablet 1 mg  1 mg Oral PRN Estella Husk, MD       And  . ziprasidone (GEODON) injection 20 mg  20 mg Intramuscular PRN Estella Husk, MD      . magnesium hydroxide (MILK OF MAGNESIA) suspension 30 mL  30 mL Oral Daily PRN Nira Conn A, NP      . nicotine (NICODERM CQ - dosed in mg/24 hours) patch 21 mg  21 mg Transdermal Daily McQuilla, Gerlean Ren B, MD      . nicotine polacrilex (NICORETTE) gum 2 mg  2 mg Oral PRN Estella Husk, MD   2 mg at 02/05/20 0801  . [START ON 02/06/2020] risperiDONE (RISPERDAL) tablet 1 mg  1 mg Oral q AM Mariel Craft, MD      . risperiDONE (RISPERDAL) tablet 2 mg  2 mg Oral q1800 Mariel Craft, MD   2 mg at 02/04/20 2052  . traZODone (DESYREL) tablet 50 mg  50 mg Oral QHS PRN Nira Conn A, NP   50 mg at 02/02/20 2126  . ziprasidone (GEODON) capsule 40 mg  40 mg Oral Q supper Mariel Craft, MD   40 mg at 02/04/20 1723   PTA Medications: Medications Prior to Admission  Medication Sig Dispense Refill Last Dose  . acetaminophen (TYLENOL) 500 MG tablet Take 1,000 mg by mouth every 6 (six) hours as needed (pain).     [name] ibuprofen (ADVIL)  600 MG tablet Take 1 tablet (600 mg total) by mouth every 6 (six) hours as needed. (Patient not taking: Reported on 01/28/2020) 30 tablet 0 Not Taking at Unknown time  . QUEtiapine (SEROQUEL) 50 MG tablet Take 1 tablet (50 mg total) by mouth at bedtime. (Patient not taking: Reported on 01/28/2020) 30 tablet 0 Not Taking at Unknown time  . venlafaxine (EFFEXOR) 75 MG tablet Take 1 tablet (75 mg total) by mouth daily. (Patient not taking: Reported on 01/28/2020) 30 tablet 0 Not Taking at Unknown time    Patient Stressors: Medication change or noncompliance Traumatic event  Patient Strengths: Average or above average intelligence Supportive family/friends  Treatment Modalities: Medication Management, Group therapy, Case management,  1 to 1 session with clinician, Psychoeducation, Recreational therapy.   Physician Treatment Plan for Primary Diagnosis: Schizophrenia (HCC) Long Term Goal(s): Improvement in symptoms so as ready for discharge Improvement in symptoms so as ready for discharge   Short Term Goals: Ability to identify changes in lifestyle to reduce recurrence of condition will improve Ability to demonstrate self-control will improve Ability to identify and develop effective coping behaviors will improve Compliance with prescribed medications will improve Ability to identify changes in lifestyle to  reduce recurrence of condition will improve Ability to verbalize feelings will improve Ability to demonstrate self-control will improve Ability to identify and develop effective coping behaviors will improve Compliance with prescribed medications will improve Ability to identify triggers associated with substance abuse/mental health issues will improve  Medication Management: Evaluate patient's response, side effects, and tolerance of medication regimen.  Therapeutic Interventions: 1 to 1 sessions, Unit Group sessions and Medication administration.  Evaluation of Outcomes: Not  Progressing  Physician Treatment Plan for Secondary Diagnosis: Principal Problem:   Schizophrenia (HCC) Active Problems:   Tobacco use disorder   Noncompliance  Long Term Goal(s): Improvement in symptoms so as ready for discharge Improvement in symptoms so as ready for discharge   Short Term Goals: Ability to identify changes in lifestyle to reduce recurrence of condition will improve Ability to demonstrate self-control will improve Ability to identify and develop effective coping behaviors will improve Compliance with prescribed medications will improve Ability to identify changes in lifestyle to reduce recurrence of condition will improve Ability to verbalize feelings will improve Ability to demonstrate self-control will improve Ability to identify and develop effective coping behaviors will improve Compliance with prescribed medications will improve Ability to identify triggers associated with substance abuse/mental health issues will improve     Medication Management: Evaluate patient's response, side effects, and tolerance of medication regimen.  Therapeutic Interventions: 1 to 1 sessions, Unit Group sessions and Medication administration.  Evaluation of Outcomes: Not Progressing   RN Treatment Plan for Primary Diagnosis: Schizophrenia (HCC) Long Term Goal(s): Knowledge of disease and therapeutic regimen to maintain health will improve  Short Term Goals: Ability to remain free from injury will improve, Ability to demonstrate self-control, Ability to participate in decision making will improve, Ability to verbalize feelings will improve, Ability to identify and develop effective coping behaviors will improve and Compliance with prescribed medications will improve  Medication Management: RN will administer medications as ordered by provider, will assess and evaluate patient's response and provide education to patient for prescribed medication. RN will report any adverse and/or  side effects to prescribing provider.  Therapeutic Interventions: 1 on 1 counseling sessions, Psychoeducation, Medication administration, Evaluate responses to treatment, Monitor vital signs and CBGs as ordered, Perform/monitor CIWA, COWS, AIMS and Fall Risk screenings as ordered, Perform wound care treatments as ordered.  Evaluation of Outcomes: Not Progressing   LCSW Treatment Plan for Primary Diagnosis: Schizophrenia (HCC) Long Term Goal(s): Safe transition to appropriate next level of care at discharge, Engage patient in therapeutic group addressing interpersonal concerns.  Short Term Goals: Engage patient in aftercare planning with referrals and resources, Increase social support, Increase ability to appropriately verbalize feelings, Facilitate acceptance of mental health diagnosis and concerns, Identify triggers associated with mental health/substance abuse issues and Increase skills for wellness and recovery  Therapeutic Interventions: Assess for all discharge needs, 1 to 1 time with Social worker, Explore available resources and support systems, Assess for adequacy in community support network, Educate family and significant other(s) on suicide prevention, Complete Psychosocial Assessment, Interpersonal group therapy.  Evaluation of Outcomes: Not Progressing   Progress in Treatment: Attending groups: Yes. Participating in groups: Yes. Taking medication as prescribed: Yes. Toleration medication: Yes. Family/Significant other contact made: Yes, individual(s) contacted:  mother Patient understands diagnosis: No. Discussing patient identified problems/goals with staff: Yes. Medical problems stabilized or resolved: Yes. Denies suicidal/homicidal ideation: Yes. Issues/concerns per patient self-inventory: No.   New problem(s) identified: No, Describe:  none  New Short Term/Long Term Goal(s): medication stabilization, elimination of  SI thoughts, development of comprehensive mental  wellness plan.   Patient Goals:  "Don't end up back where I am now"  Discharge Plan or Barriers: Patient is unable to return to live with mother. Patient stated he would stay at a hotel. May also be interested in [facility]. Patient has declined all follow up. CSW will continue to assess patients discharge needs.    Reason for Continuation of Hospitalization: Delusions  Hallucinations Medication stabilization  Estimated Length of Stay: 3-5 days  Attendees: Patient:  02/05/2020   Physician:  02/05/2020   Nursing:  02/05/2020   RN Care Manager: 02/05/2020   Social Worker: Ruthann Cancer, LCSW 02/05/2020  Recreational Therapist:  02/05/2020  Other:  02/05/2020   Other:  02/05/2020  Other: 02/05/2020     Scribe for Treatment Team: Otelia Santee, LCSW 02/05/2020 9:40 AM

## 2020-02-06 DIAGNOSIS — F203 Undifferentiated schizophrenia: Secondary | ICD-10-CM

## 2020-02-06 MED ORDER — PALIPERIDONE ER 6 MG PO TB24
6.0000 mg | ORAL_TABLET | Freq: Every day | ORAL | Status: DC
  Filled 2020-02-06: qty 1

## 2020-02-06 MED FILL — INVEGA SUSTENNA 234 MG PREF: 234 | 30 days supply | Qty: 2 | Fill #0

## 2020-02-06 NOTE — [facility] Group Notes (Signed)
Adult Psychoeducational Group Note  Date:  02/06/2020 Time:  10:28 AM  Group Topic/Focus:  Goals Group:   The focus of this group is to help patients establish daily goals to achieve during treatment and discuss how the patient can incorporate goal setting into their daily lives to aide in recovery.  Participation Level:  Active  Participation Quality:  Appropriate  Affect:  Appropriate  Cognitive:  Appropriate  Insight: Appropriate  Engagement in Group:  Engaged  Modes of Intervention:  Discussion  Additional Comments:  Pt says the goal is to "get back to normal self." Things like "being social" and being able to "remember things." Pt states that their mood has improved a great deal since admission.   Shane Allen Shane Allen 02/06/2020, 10:28 AM

## 2020-02-06 NOTE — Progress Notes (Signed)
Progress note    02/06/20 0726  Psych Admission Type (Psych Patients Only)  Admission Status Involuntary  Psychosocial Assessment  Patient Complaints Anxiety  Eye Contact Fair  Facial Expression Blank;Pensive  Affect Blunted;Preoccupied  Speech Logical/coherent;Slow  Interaction Assertive  Motor Activity Fidgety  Appearance/Hygiene Unremarkable  Behavior Characteristics Cooperative;Appropriate to situation;Anxious  Mood Anxious;Preoccupied;Pleasant  Thought Process  Coherency Blocking  Content Paranoia  Delusions Paranoid  Perception WDL  Hallucination None reported or observed  Judgment Poor  Confusion None  Danger to Self  Current suicidal ideation? Denies  Danger to Others  Danger to Others None reported or observed

## 2020-02-06 NOTE — Progress Notes (Signed)
[facility] MD Progress Note  02/06/2020 11:47 AM Shane Allen  MRN:  [id number] Subjective: Patient is a 34 year old male with a past psychiatric history significant for schizophrenia who presented to the [facility] on 01/30/2020 after having stopped his antipsychotic medications, and placed under involuntary commitment.  Objective: Patient is seen and examined.  Patient is a 34 year old male with the above-stated past psychiatric history is seen in follow-up.  Patient was seen yesterday and review of the electronic medical record revealed a longstanding history of noncompliance.  He was significantly paranoid.  After review of the electronic medical record discussion with the patient about going back on long-acting injectable antipsychotic medications.  He stated he had been taking the oral medicines compliantly, but we discussed his history.  The patient has been on multiple antipsychotics in the past, and had a long history of noncompliance with oral medications but had been fairly stable on the long-acting paliperidone injection.  The patient agreed to the long-acting paliperidone injection.  His antipsychotics were also switched to oral paliperidone yesterday.  He received paliperidone 3 mg p.o. twice daily yesterday.  He remains paranoid, irritable and at times rather threatening.  He is not happy about getting the long-acting injectable, but he is agreeing to it.  His blood pressure is mildly elevated today at 117/97.  His pulse was 126 this morning.  He slept 7.5 hours last night.  His EKG from 12/13 showed a normal sinus rhythm with a QTc interval that was considered normal as well.  He denied any suicidal or homicidal ideation.  He denied any active auditory or visual hallucinations.  He does remain paranoid.  Principal Problem: Schizophrenia (HCC) Diagnosis: Principal Problem:   Schizophrenia (HCC) Active Problems:   Tobacco use disorder   Noncompliance  Total  Time spent with patient: 20 minutes  Past Psychiatric History: See admission H&P  Past Medical History:  Past Medical History:  Diagnosis Date  . Depression   . 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  [name] affective schizophrenia [facility])     Past Surgical History:  Procedure Laterality Date  . NO PAST SURGERIES     Family History:  Family History  Problem Relation Age of Onset  . Diabetes Mother    Family Psychiatric  History: See admission H&P Social History:  Social History   Substance and Sexual Activity  Alcohol Use No   Comment: 4 to 5 beers a day every other week for sleep     Social History   Substance and Sexual Activity  Drug Use No    Social History   Socioeconomic History  . Marital status: Single    Spouse name: Not on file  . Number of children: Not on file  . Years of education: Not on file  . Highest education level: Not on file  Occupational History  . Not on file  Tobacco Use  . Smoking status: Former Smoker    Packs/day: 1.00    Years: 10.00    Pack years: 10.00    Types: Cigarettes  . Smokeless tobacco: Never Used  Substance and Sexual Activity  . Alcohol use: No    Comment: 4 to 5 beers a day every other week for sleep  . Drug use: No  . Sexual activity: Never    Birth control/protection: None  Other Topics Concern  . Not on file  Social History Narrative  .  Not on file   Social Determinants of Health   Financial Resource Strain: Not on file  Food Insecurity: Not on file  Transportation Needs: Not on file  Physical Activity: Not on file  Stress: Not on file  Social Connections: Not on file   Additional Social History:                         Sleep: Fair  Appetite:  Good  Current Medications: Current Facility-Administered Medications  Medication Dose Route Frequency Provider Last Rate Last Admin  . alum & mag hydroxide-simeth (MAALOX/MYLANTA) 200-200-20  MG/5ML suspension 30 mL  30 mL Oral Q4H PRN Nira Conn A, NP      . hydrOXYzine (ATARAX/VISTARIL) tablet 25 mg  25 mg Oral TID PRN Jackelyn Poling, NP   25 mg at 02/02/20 2126  . OLANZapine zydis (ZYPREXA) disintegrating tablet 10 mg  10 mg Oral Q8H PRN Estella Husk, MD   10 mg at 01/30/20 1051   And  . LORazepam (ATIVAN) tablet 1 mg  1 mg Oral PRN Estella Husk, MD       And  . ziprasidone (GEODON) injection 20 mg  20 mg Intramuscular PRN Estella Husk, MD      . magnesium hydroxide (MILK OF MAGNESIA) suspension 30 mL  30 mL Oral Daily PRN Nira Conn A, NP      . nicotine (NICODERM CQ - dosed in mg/24 hours) patch 21 mg  21 mg Transdermal Daily McQuilla, Gerlean Ren B, MD      . nicotine polacrilex (NICORETTE) gum 2 mg  2 mg Oral PRN Estella Husk, MD   2 mg at 02/06/20 1109  . paliperidone (INVEGA SUSTENNA) injection 234 mg  234 mg Intramuscular Once Antonieta Pert, MD      . paliperidone (INVEGA) 24 hr tablet 3 mg  3 mg Oral BID Antonieta Pert, MD   3 mg at 02/06/20 0727  . traZODone (DESYREL) tablet 50 mg  50 mg Oral QHS PRN Jackelyn Poling, NP   50 mg at 02/02/20 2126    Lab Results: No results found for this or any previous visit (from the past 48 hour(s)).  Blood Alcohol level:  Lab Results  Component Value Date   ETH <10 01/28/2020   ETH <10 11/27/2018    Metabolic Disorder Labs: Lab Results  Component Value Date   HGBA1C 5.3 01/30/2020   MPG 105.41 01/30/2020   MPG 108.28 01/19/2017   Lab Results  Component Value Date   PROLACTIN 21.7 (H) 08/18/2015   Lab Results  Component Value Date   CHOL 170 01/30/2020   TRIG 57 01/30/2020   HDL 45 01/30/2020   CHOLHDL 3.8 01/30/2020   VLDL 11 01/30/2020   LDLCALC 114 (H) 01/30/2020   LDLCALC 94 01/19/2017    Physical Findings: AIMS: Facial and Oral Movements Muscles of Facial Expression: None, normal Lips and Perioral Area: None, normal Jaw: None, normal Tongue: None, normal,Extremity  Movements Upper (arms, wrists, hands, fingers): None, normal Lower (legs, knees, ankles, toes): None, normal, Trunk Movements Neck, shoulders, hips: None, normal, Overall Severity Severity of abnormal movements (highest score from questions above): None, normal Incapacitation due to abnormal movements: None, normal Patient's awareness of abnormal movements (rate only patient's report): No Awareness, Dental Status Current problems with teeth and/or dentures?: No Does patient usually wear dentures?: No  CIWA:    COWS:     Musculoskeletal: Strength & Muscle  Tone: within normal limits Gait & Station: normal Patient leans: N/A  Psychiatric Specialty Exam: Physical Exam Vitals and nursing note reviewed.  Constitutional:      Appearance: Normal appearance.  HENT:     Head: Normocephalic and atraumatic.  Pulmonary:     Effort: Pulmonary effort is normal.  Neurological:     General: No focal deficit present.     Mental Status: He is alert and oriented to person, place, and time.     Review of Systems  Blood pressure (!) 117/97, pulse (!) 126, temperature (!) 97.4 F (36.3 C), temperature source Oral, resp. rate 18, height 5\' 11"  (1.803 m), weight 95.3 kg, SpO2 98 %.Body mass index is 29.29 kg/m.  General Appearance: Disheveled  Eye Contact:  Fair  Speech:  Normal Rate  Volume:  Normal  Mood:  Dysphoric and Irritable  Affect:  Congruent  Thought Process:  Coherent and Descriptions of Associations: Loose  Orientation:  Full (Time, Place, and Person)  Thought Content:  Delusions and Paranoid Ideation  Suicidal Thoughts:  No  Homicidal Thoughts:  No  Memory:  Immediate;   Fair Recent;   Fair Remote;   Fair  Judgement:  Intact  Insight:  Lacking  Psychomotor Activity:  Increased  Concentration:  Concentration: Fair and Attention Span: Fair  Recall:  of Knowledge:  Fair  Language:  Good  Akathisia:  Negative  Handed:  Right  AIMS (if indicated):     Assets:   Desire for Improvement Resilience  ADL's:  Intact  Cognition:  WNL  Sleep:  Number of Hours: 7.5     Treatment Plan Summary: Daily contact with patient to assess and evaluate symptoms and progress in treatment, Medication management and Plan : Patient is seen and examined.  Patient is a 34 year old male with the above-stated past psychiatric history who is seen in follow-up.   Diagnosis: 1.  Schizophrenia 2.  Labile hypertension  Pertinent findings on examination today: 1.  Paranoia continues 2.  Sleep is improved 3.  Patient is still cooperative with regard to the long-acting paliperidone injection.  Plan: 1.  Continue hydroxyzine 25 mg p.o. 3 times daily as needed anxiety. 2.  Continue Zyprexa agitation protocol as needed. 3.  Patient will receive the paliperidone long-acting injectable medications 234 mg IM x1 hopefully today.  This is for psychosis. 4.  Consolidate oral paliperidone to 6 mg p.o. nightly for psychosis. 5.  Continue trazodone 50 mg p.o. nightly as needed insomnia. 6.  Monitor blood pressure 7.  Disposition planning-in progress.  31, MD 02/06/2020, 11:47 AM

## 2020-02-06 NOTE — Progress Notes (Signed)
[facility] MD Progress Note  02/06/2020 9:26 AM Shane Allen  MRN:  [id number]   Subjective: Patient reports mood is "great" today. Reports he went to bed at 9p last night. Denies change in appetite. Patient was initially found talking to another male patient on the hall. When questioned what they were discussing, he states he asked the patient for her number last night and then wanted to apologize to her this morning. The patient was encouraged to stay away from other male patients while hospitalized. Patient participated in group therapy yesterday. Patient recalls hanging up on his mother yesterday. Reinformed patient that he was unable to return to live with his mother when he is discharged from the facility. When asked if he was still hearing God, patient replied "that's private".  Patient denies SI, HI, or visual hallucinations today. Denies feelings of restlessness. Discussed plan to start injectable medication today.   Principal Problem: Schizophrenia (HCC) Diagnosis: Principal Problem:   Schizophrenia (HCC) Active Problems:   Tobacco use disorder   Noncompliance  Total Time spent with patient: 15 minutes  Past Psychiatric History: Patient with history of schizophrenia. Previously hospitalized in 2018 for psychosis. Per records, has previously tried and failed Haldol, Navane, Abilify, Latuda, Invega sustenna, Seroquel, Saphris, Zyprexa, and Rispderal.   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   Schizo affective schizophrenia [facility])     Past Surgical History:  Procedure Laterality Date   NO PAST SURGERIES     Family History:  Family History  Problem Relation Age of Onset   Diabetes Mother    Family Psychiatric  History: See H&P Social History:  Social History   Substance and Sexual Activity  Alcohol Use No   Comment: 4 to 5 beers a  day every other week for sleep     Social History   Substance and Sexual Activity  Drug Use No    Social History   Socioeconomic History   Marital status: Single    Spouse name: Not on file   Number of children: Not on file   Years of education: Not on file   Highest education level: Not on file  Occupational History   Not on file  Tobacco Use   Smoking status: Former Smoker    Packs/day: 1.00    Years: 10.00    Pack years: 10.00    Types: Cigarettes   Smokeless tobacco: Never Used  Substance and Sexual Activity   Alcohol use: No    Comment: 4 to 5 beers a day every other week for sleep   Drug use: No   Sexual activity: Never    Birth control/protection: None  Other Topics Concern   Not on file  Social History Narrative   Not on file   Social Determinants of Health   Financial Resource Strain: Not on file  Food Insecurity: Not on file  Transportation Needs: Not on file  Physical Activity: Not on file  Stress: Not on file  Social Connections: Not on file   Additional Social History:   Sleep: Good  Appetite:  Good  Current Medications: Current Facility-Administered Medications  Medication Dose Route Frequency Provider Last Rate Last Admin   alum & mag hydroxide-simeth (MAALOX/MYLANTA) 200-200-20 MG/5ML suspension 30 mL  30 mL Oral Q4H PRN Jackelyn Poling, NP       hydrOXYzine (  ATARAX/VISTARIL) tablet 25 mg  25 mg Oral TID PRN Nira Conn A, NP   25 mg at 02/02/20 2126   OLANZapine zydis (ZYPREXA) disintegrating tablet 10 mg  10 mg Oral Q8H PRN Estella Husk, MD   10 mg at 01/30/20 1051   And   LORazepam (ATIVAN) tablet 1 mg  1 mg Oral PRN Estella Husk, MD       And   ziprasidone (GEODON) injection 20 mg  20 mg Intramuscular PRN Estella Husk, MD       magnesium hydroxide (MILK OF MAGNESIA) suspension 30 mL  30 mL Oral Daily PRN Nira Conn A, NP       nicotine (NICODERM CQ - dosed in mg/24 hours) patch 21 mg  21 mg  Transdermal Daily Leili Eskenazi, Gerlean Ren B, MD       nicotine polacrilex (NICORETTE) gum 2 mg  2 mg Oral PRN Estella Husk, MD   2 mg at 02/06/20 0726   paliperidone (INVEGA SUSTENNA) injection 234 mg  234 mg Intramuscular Once Antonieta Pert, MD       paliperidone (INVEGA) 24 hr tablet 3 mg  3 mg Oral BID Antonieta Pert, MD   3 mg at 02/06/20 1610   traZODone (DESYREL) tablet 50 mg  50 mg Oral QHS PRN Jackelyn Poling, NP   50 mg at 02/02/20 2126    Lab Results: No results found for this or any previous visit (from the past 48 hour(s)).  Blood Alcohol level:  Lab Results  Component Value Date   ETH <10 01/28/2020   ETH <10 11/27/2018    Metabolic Disorder Labs: Lab Results  Component Value Date   HGBA1C 5.3 01/30/2020   MPG 105.41 01/30/2020   MPG 108.28 01/19/2017   Lab Results  Component Value Date   PROLACTIN 21.7 (H) 08/18/2015   Lab Results  Component Value Date   CHOL 170 01/30/2020   TRIG 57 01/30/2020   HDL 45 01/30/2020   CHOLHDL 3.8 01/30/2020   VLDL 11 01/30/2020   LDLCALC 114 (H) 01/30/2020   LDLCALC 94 01/19/2017    Physical Findings: AIMS: Facial and Oral Movements Muscles of Facial Expression: None, normal Lips and Perioral Area: None, normal Jaw: None, normal Tongue: None, normal,Extremity Movements Upper (arms, wrists, hands, fingers): None, normal Lower (legs, knees, ankles, toes): None, normal, Trunk Movements Neck, shoulders, hips: None, normal, Overall Severity Severity of abnormal movements (highest score from questions above): None, normal Incapacitation due to abnormal movements: None, normal Patient's awareness of abnormal movements (rate only patient's report): No Awareness, Dental Status Current problems with teeth and/or dentures?: No Does patient usually wear dentures?: No  CIWA:    COWS:     Musculoskeletal: Strength & Muscle Tone: within normal limits Gait & Station: normal Patient leans: N/A  Psychiatric Specialty  Exam: Physical Exam Constitutional:      General: He is not in acute distress. HENT:     Head: Normocephalic.  Pulmonary:     Effort: No respiratory distress.  Skin:    Coloration: Skin is not jaundiced.     Review of Systems  Constitutional: Negative for appetite change.  Psychiatric/Behavioral: Negative for self-injury, sleep disturbance and suicidal ideas.    Blood pressure (!) 117/97, pulse (!) 126, temperature (!) 97.4 F (36.3 C), temperature source Oral, resp. rate 18, height 5\' 11"  (1.803 m), weight 95.3 kg, SpO2 98 %.Body mass index is 29.29 kg/m.  General Appearance: Disheveled - again wearing same  clothing that he's worn since admission.  Eye Contact:  Fair  Speech:  Clear and Coherent  Volume:  Normal  Mood:  "great"  Affect:  Flat  Thought Process:  Disorganized and Irrelevant  Orientation:  NA  Thought Content:  Delusions  Suicidal Thoughts:  No  Homicidal Thoughts:  No  Memory:  Recent;   Poor  Judgement:  Impaired  Insight:  Lacking  Psychomotor Activity:  Normal  Concentration:  Concentration: Poor  Recall:  NA  Fund of Knowledge:  NA  Language:  Fair  Akathisia:  No  Handed:  Right  AIMS (if indicated):     Assets:  Leisure Time  ADL's:  Intact  Cognition:  Impaired,  Mild  Sleep:  Number of Hours: 7.5     Treatment Plan Summary: Daily contact with patient to assess and evaluate symptoms and progress in treatment and Medication management  Mr. Phillippi is a 34 yo patient w/ hx of schizophrenia who continues to appear psychotic on the unit. He has had multiple instances of hitting the STARR button, interacting with other male patients, and instances of poor impulse control. He has failed multiple antipsychotic in the past. List of tried and failed antipsychotics includes:  Saphris, Zyprexa, Abilify, Invega, [place], Seroquel, and [place]. Patient has also had Temzepam, Zoloft, Venlafaxine, Cymbalta, depakote, congentin and amatadine. Patient  reported that he did well on [place] and there are decreased hospitalizations around the time he was compliant with this medication. Invega PO was started yesterday with plan to add injectable today.  Schizophrenia - Hinda Glatter Sustenna 234mg  IM today - Invega 3mg  PO BID - Monitor for EPS symptoms as patient has reported Akathisia in the past  PRN - Nicorette gum - Maalox 1ml q4h - Atarax 25mg  TID - Milk of Mag 32mL - Trazodone 50mg  QHS - Agitation Protocol:  Olanzapine 10mg  q8h  Ativan 1mg    Geodon 20mg  PRN  31m, Medical Student 02/06/2020, 9:26 AM   Attestation for Student Documentation:  I personally was present and performed or re-performed the history, physical exam and medical decision-making activities of this service and have verified that the service and findings are accurately documented in the students note.  PGY-1 31m, MD 02/06/2020, 12:00 PM

## 2020-02-06 NOTE — Progress Notes (Signed)
Per grandmother, pt religiously exempts the covid vaccine.

## 2020-02-06 NOTE — Progress Notes (Signed)
Pt observed in the dayroom with peers watching TV. Pt report good day, good appetite. Pt states he sleep well at night and did not require sleep medication. Denies HI/SI and contracted for safety, will continue to monitor.

## 2020-02-06 NOTE — Plan of Care (Signed)
  Problem: Activity: Goal: Will verbalize the importance of balancing activity with adequate rest periods Outcome: Progressing   Problem: Education: Goal: Will be free of psychotic symptoms Outcome: Progressing Goal: Knowledge of the prescribed therapeutic regimen will improve Outcome: Progressing   Problem: Coping: Goal: Coping ability will improve Outcome: Progressing   

## 2020-02-07 MED ORDER — PALIPERIDONE ER 3 MG PO TB24
3.0000 mg | ORAL_TABLET | ORAL | Status: AC
  Administered 2020-02-07: 13:00:00 3 mg via ORAL
  Filled 2020-02-07: qty 1

## 2020-02-07 MED ORDER — PALIPERIDONE ER 3 MG PO TB24
9.0000 mg | ORAL_TABLET | Freq: Every day | ORAL | Status: DC
  Administered 2020-02-07: 21:00:00 9 mg via ORAL
  Filled 2020-02-07 (×2): qty 3

## 2020-02-07 NOTE — Progress Notes (Signed)
[facility] MD Progress Note  02/07/2020 12:01 PM Shane [facility]  MRN:  [id number] Subjective:  Patient is a 34 year old male with a past psychiatric history significant for schizophrenia who presented to the [facility] on 01/30/2020 after having stopped his antipsychotic medications, and placed under involuntary commitment.  Objective: Patient is seen and examined.  Patient is a 34 year old male with the above-stated past psychiatric history who is seen in follow-up.  He seems a bit better today.  Initially in the morning he was quite calm, and denied a great deal of symptoms.  Later in the morning he was dancing and singing in a loud manner in the day room.  He did receive the long-acting paliperidone injection on 12/14.  He is also been getting paliperidone 6 mg p.o. nightly.  Social work confirmed that the patient would not be able to return to his family's home.  We discussed that this morning.  He denied auditory or visual hallucinations.  He denied any suicidal or homicidal ideation.  His vital signs are stable, he is afebrile.  He slept 6.25 hours last night.  No new laboratories.  His EKG from 12/14 showed a sinus bradycardia with a rate of 53.  QTc interval was normal.  Principal Problem: Schizophrenia (HCC) Diagnosis: Principal Problem:   Schizophrenia (HCC) Active Problems:   Tobacco use disorder   Noncompliance  Total Time spent with patient: 20 minutes  Past Psychiatric History: See admission H&P  Past Medical History:  Past Medical History:  Diagnosis Date  . Depression   . 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  [facility] affective schizophrenia [facility])     Past Surgical History:  Procedure Laterality Date  . NO PAST SURGERIES     Family History:  Family History  Problem Relation Age of Onset  . Diabetes Mother    Family Psychiatric  History: See admission H&P Social History:  Social  History   Substance and Sexual Activity  Alcohol Use No   Comment: 4 to 5 beers a day every other week for sleep     Social History   Substance and Sexual Activity  Drug Use No    Social History   Socioeconomic History  . Marital status: Single    Spouse name: Not on file  . Number of children: Not on file  . Years of education: Not on file  . Highest education level: Not on file  Occupational History  . Not on file  Tobacco Use  . Smoking status: Former Smoker    Packs/day: 1.00    Years: 10.00    Pack years: 10.00    Types: Cigarettes  . Smokeless tobacco: Never Used  Substance and Sexual Activity  . Alcohol use: No    Comment: 4 to 5 beers a day every other week for sleep  . Drug use: No  . Sexual activity: Never    Birth control/protection: None  Other Topics Concern  . Not on file  Social History Narrative  . Not on file   Social Determinants of Health   Financial Resource Strain: Not on file  Food Insecurity: Not on file  Transportation Needs: Not on file  Physical Activity: Not on file  Stress: Not on file  Social Connections: Not on file   Additional Social History:  Sleep: Good  Appetite:  Good  Current Medications: Current Facility-Administered Medications  Medication Dose Route Frequency Provider Last Rate Last Admin  . alum & mag hydroxide-simeth (MAALOX/MYLANTA) 200-200-20 MG/5ML suspension 30 mL  30 mL Oral Q4H PRN Nira Conn A, NP      . hydrOXYzine (ATARAX/VISTARIL) tablet 25 mg  25 mg Oral TID PRN Jackelyn Poling, NP   25 mg at 02/02/20 2126  . OLANZapine zydis (ZYPREXA) disintegrating tablet 10 mg  10 mg Oral Q8H PRN Estella Husk, MD   10 mg at 01/30/20 1051   And  . LORazepam (ATIVAN) tablet 1 mg  1 mg Oral PRN Estella Husk, MD       And  . ziprasidone (GEODON) injection 20 mg  20 mg Intramuscular PRN Estella Husk, MD      . magnesium hydroxide (MILK OF MAGNESIA) suspension 30  mL  30 mL Oral Daily PRN Nira Conn A, NP      . nicotine (NICODERM CQ - dosed in mg/24 hours) patch 21 mg  21 mg Transdermal Daily McQuilla, Gerlean Ren B, MD      . nicotine polacrilex (NICORETTE) gum 2 mg  2 mg Oral PRN Estella Husk, MD   2 mg at 02/06/20 1743  . paliperidone (INVEGA) 24 hr tablet 3 mg  3 mg Oral NOW Jola Babinski, Marlane Mingle, MD      . paliperidone (INVEGA) 24 hr tablet 9 mg  9 mg Oral QHS Antonieta Pert, MD      . traZODone (DESYREL) tablet 50 mg  50 mg Oral QHS PRN Jackelyn Poling, NP   50 mg at 02/02/20 2126    Lab Results: No results found for this or any previous visit (from the past 48 hour(s)).  Blood Alcohol level:  Lab Results  Component Value Date   ETH <10 01/28/2020   ETH <10 11/27/2018    Metabolic Disorder Labs: Lab Results  Component Value Date   HGBA1C 5.3 01/30/2020   MPG 105.41 01/30/2020   MPG 108.28 01/19/2017   Lab Results  Component Value Date   PROLACTIN 21.7 (H) 08/18/2015   Lab Results  Component Value Date   CHOL 170 01/30/2020   TRIG 57 01/30/2020   HDL 45 01/30/2020   CHOLHDL 3.8 01/30/2020   VLDL 11 01/30/2020   LDLCALC 114 (H) 01/30/2020   LDLCALC 94 01/19/2017    Physical Findings: AIMS: Facial and Oral Movements Muscles of Facial Expression: None, normal Lips and Perioral Area: None, normal Jaw: None, normal Tongue: None, normal,Extremity Movements Upper (arms, wrists, hands, fingers): None, normal Lower (legs, knees, ankles, toes): None, normal, Trunk Movements Neck, shoulders, hips: None, normal, Overall Severity Severity of abnormal movements (highest score from questions above): None, normal Incapacitation due to abnormal movements: None, normal Patient's awareness of abnormal movements (rate only patient's report): No Awareness, Dental Status Current problems with teeth and/or dentures?: No Does patient usually wear dentures?: No  CIWA:    COWS:     Musculoskeletal: Strength & Muscle Tone: within normal  limits Gait & Station: normal Patient leans: N/A  Psychiatric Specialty Exam: Physical Exam Vitals and nursing note reviewed.  HENT:     Head: Normocephalic.  Pulmonary:     Effort: Pulmonary effort is normal.  Neurological:     General: No focal deficit present.     Mental Status: He is alert and oriented to person, place, and time.     Review of Systems  Blood  pressure (!) 138/92, pulse 89, temperature (!) 97.4 F (36.3 C), temperature source Oral, resp. rate 18, height 5\' 11"  (1.803 m), weight 95.3 kg, SpO2 98 %.Body mass index is 29.29 kg/m.  General Appearance: Casual  Eye Contact:  Fair  Speech:  Normal Rate  Volume:  Normal  Mood:  Dysphoric  Affect:  Constricted  Thought Process:  Coherent and Descriptions of Associations: Circumstantial  Orientation:  Full (Time, Place, and Person)  Thought Content:  Delusions and Paranoid Ideation  Suicidal Thoughts:  No  Homicidal Thoughts:  No  Memory:  Immediate;   Fair Recent;   Fair Remote;   Fair  Judgement:  Intact  Insight:  Lacking  Psychomotor Activity:  Normal  Concentration:  Concentration: Fair and Attention Span: Fair  Recall:  of Knowledge:  Fair  Language:  Good  Akathisia:  Negative  Handed:  Right  AIMS (if indicated):     Assets:  Desire for Improvement Resilience  ADL's:  Intact  Cognition:  WNL  Sleep:  Number of Hours: 6.25     Treatment Plan Summary: Daily contact with patient to assess and evaluate symptoms and progress in treatment, Medication management and Plan : Patient is seen and examined.  Patient is a 34 year old male with the above-stated past psychiatric history who is seen in follow-up.   Diagnosis: 1.  Schizophrenia 2.  Hypertension  Pertinent findings on examination today: 1.  Decreased paranoia. 2.  Sleep is improved. 3.  Some lability in mood during the day.  Plan: 1.  Continue hydroxyzine 25 mg p.o. 3 times daily as needed anxiety. 2.  Continue Zyprexa  agitation protocol as needed. 3.  Patient received the paliperidone long-acting injectable medications 234 mg IM x1 on 12/14.  This is for psychosis. 4.  Increase oral paliperidone to 9 mg p.o. nightly for psychosis. 5.  Continue trazodone 50 mg p.o. nightly as needed insomnia. 6.  Monitor blood pressure 7.  Patient will receive the long-acting injectable paliperidone 156 mg IM x1 on 02/13/2020. 8.  Disposition planning-in progress.  02/15/2020, MD 02/07/2020, 12:01 PM

## 2020-02-07 NOTE — Progress Notes (Signed)
DAR NOTE: Patient presents with anxious affect and depressed mood.  Denies suicidal thoughts,pain, auditory and visual hallucinations.  Described energy level as normal and concentration as good.  Rates depression at 0, hopelessness at 0, and anxiety at 0.  Maintained on routine safety checks.  Medications given as prescribed.  Support and encouragement offered as needed.  Attended group and participated.  States goal for today is "to meet the requirement."  Patient observed socializing with peers in the dayroom.  Offered no complaint.

## 2020-02-07 NOTE — BHH Counselor (Signed)
Pt requested to go to [facility] at discharge.    Ruthann Cancer MSW, LCSW Clincal Social Worker  [facility]
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
## 2020-02-07 NOTE — Progress Notes (Signed)
Pt present with disorganized thoughts on the unit. Pt has been isolating himself in the room, denied SI/HI. Reports good sleep, good appetite, will continue to monitor.

## 2020-02-07 NOTE — BHH Group Notes (Signed)
Adult Psychoeducational Group Note  Date:  02/07/2020 Time:  11:50 AM  Group Topic/Focus:  Making Healthy Choices:   The focus of this group is to help patients identify negative/unhealthy choices they were using prior to admission and identify positive/healthier coping strategies to replace them upon discharge.  Participation Level:  Minimal  Participation Quality:  Appropriate  Affect:  Appropriate  Cognitive:  Appropriate  Insight: Appropriate  Engagement in Group:  Limited  Modes of Intervention:  Discussion  Additional Comments:  Pt attended morning group session.   Deforest Hoyles Nicklous Aburto 02/07/2020, 11:50 AM

## 2020-02-08 DIAGNOSIS — F200 Paranoid schizophrenia: Secondary | ICD-10-CM

## 2020-02-08 MED ORDER — NICOTINE 21 MG/24HR TD PT24
21.0000 mg | MEDICATED_PATCH | Freq: Every day | TRANSDERMAL | 0 refills | Status: DC
Start: 2020-02-09 — End: 2021-02-15

## 2020-02-08 MED ORDER — TRAZODONE HCL 50 MG PO TABS: 50.0000 mg | ORAL_TABLET | Freq: Every evening | ORAL | 0 refills | Status: DC | PRN

## 2020-02-08 MED ORDER — PALIPERIDONE ER 9 MG PO TB24: 9.0000 mg | ORAL_TABLET | Freq: Every day | ORAL | 0 refills | Status: DC

## 2020-02-08 MED ORDER — HYDROXYZINE HCL 25 MG PO TABS: 25.0000 mg | ORAL_TABLET | Freq: Three times a day (TID) | ORAL | 0 refills | Status: DC | PRN

## 2020-02-08 NOTE — Plan of Care (Signed)
°  Problem: Activity: Goal: Will verbalize the importance of balancing activity with adequate rest periods 02/08/2020 1342 by Jerrye Bushy, RN Outcome: Adequate for Discharge 02/08/2020 1052 by Jerrye Bushy, RN Outcome: Adequate for Discharge   Problem: Education: Goal: Will be free of psychotic symptoms 02/08/2020 1342 by Jerrye Bushy, RN Outcome: Adequate for Discharge 02/08/2020 1052 by Jerrye Bushy, RN Outcome: Adequate for Discharge Goal: Knowledge of the prescribed therapeutic regimen will improve 02/08/2020 1342 by Jerrye Bushy, RN Outcome: Adequate for Discharge 02/08/2020 1052 by Jerrye Bushy, RN Outcome: Adequate for Discharge   Problem: Coping: Goal: Coping ability will improve 02/08/2020 1342 by Jerrye Bushy, RN Outcome: Adequate for Discharge 02/08/2020 1052 by Jerrye Bushy, RN Outcome: Adequate for Discharge Goal: Will verbalize feelings 02/08/2020 1342 by Jerrye Bushy, RN Outcome: Adequate for Discharge 02/08/2020 1052 by Jerrye Bushy, RN Outcome: Adequate for Discharge   Problem: Health Behavior/Discharge Planning: Goal: Compliance with prescribed medication regimen will improve 02/08/2020 1342 by Jerrye Bushy, RN Outcome: Adequate for Discharge 02/08/2020 1052 by Jerrye Bushy, RN Outcome: Adequate for Discharge   Problem: Nutritional: Goal: Ability to achieve adequate nutritional intake will improve 02/08/2020 1342 by Jerrye Bushy, RN Outcome: Adequate for Discharge 02/08/2020 1052 by Jerrye Bushy, RN Outcome: Adequate for Discharge   Problem: Role Relationship: Goal: Ability to communicate needs accurately will improve 02/08/2020 1342 by Jerrye Bushy, RN Outcome: Adequate for Discharge 02/08/2020 1052 by Jerrye Bushy, RN Outcome: Adequate for Discharge Goal: Ability to interact with others will improve 02/08/2020 1342 by Jerrye Bushy, RN Outcome:  Adequate for Discharge 02/08/2020 1052 by Jerrye Bushy, RN Outcome: Adequate for Discharge   Problem: Safety: Goal: Ability to redirect hostility and anger into socially appropriate behaviors will improve 02/08/2020 1342 by Jerrye Bushy, RN Outcome: Adequate for Discharge 02/08/2020 1052 by Jerrye Bushy, RN Outcome: Adequate for Discharge Goal: Ability to remain free from injury will improve 02/08/2020 1342 by Jerrye Bushy, RN Outcome: Adequate for Discharge 02/08/2020 1052 by Jerrye Bushy, RN Outcome: Adequate for Discharge   Problem: Self-Care: Goal: Ability to participate in self-care as condition permits will improve 02/08/2020 1342 by Jerrye Bushy, RN Outcome: Adequate for Discharge 02/08/2020 1052 by Jerrye Bushy, RN Outcome: Adequate for Discharge   Problem: Self-Concept: Goal: Will verbalize positive feelings about self 02/08/2020 1342 by Jerrye Bushy, RN Outcome: Adequate for Discharge 02/08/2020 1052 by Jerrye Bushy, RN Outcome: Adequate for Discharge  Care plan complete.  Patient discharged.

## 2020-02-08 NOTE — BHH Group Notes (Signed)
The focus of this group is to help patients establish daily goals to achieve during treatment and discuss how the patient can incorporate goal setting into their daily lives to aide in recovery.  Pt did not attend group 

## 2020-02-08 NOTE — Progress Notes (Signed)
°  [facility] Adult Case Management Discharge Plan :  Will you be returning to the same living situation after discharge:  No. Will be going to [facility] At discharge, do you have transportation home?: No. Safe Transport will be arranged. Do you have the ability to pay for your medications: Yes,  has insurance   Release of information consent forms completed and in the chart;  Patient's signature needed at discharge.  Patient to Follow up at:  Follow-up Information    [facility]. Call.   Why: A referral has been made to this provider for ACTT services. The provider will call you with the status of this referral, or you may call # [phone] for more information. Contact information: [address]  [address] [place] [address] [phone]        [facility]. Go to.   Specialty: Behavioral Health Why: Walk-in to begin services for medication management and therapy.  Contact information: [address] [place] [address] [phone]              Next level of care provider has access to [facility]:no  Safety Planning and Suicide Prevention discussed: Yes,  with mother     Has patient been referred to the Quitline?: Patient refused referral  Patient has been referred for addiction treatment: Pt. refused referral  Otelia Santee, LCSW 02/08/2020, 9:51 AM

## 2020-02-08 NOTE — Plan of Care (Signed)
Pt was able to focus on task with little prompting from LRT at completion of recreation therapy group sessions.    Caroll Rancher, LRT/CTRS

## 2020-02-08 NOTE — BHH Suicide Risk Assessment (Signed)
[facility] Discharge Suicide Risk Assessment   Principal Problem: Schizophrenia [facility]) Discharge Diagnoses: Principal Problem:   Schizophrenia (HCC) Active Problems:   Tobacco use disorder   Noncompliance   Total Time spent with patient: 20 minutes  Musculoskeletal: Strength & Muscle Tone: within normal limits Gait & Station: normal Patient leans: N/A  Psychiatric Specialty Exam: Review of Systems  All other systems reviewed and are negative.   Blood pressure (!) 133/103, pulse (!) 115, temperature (!) 97.4 F (36.3 C), temperature source Oral, resp. rate 18, height 5\' 11"  (1.803 m), weight 95.3 kg, SpO2 99 %.Body mass index is 29.29 kg/m.  General Appearance: Casual  Eye Contact::  Fair  Speech:  Normal Rate409  Volume:  Normal  Mood:  Dysphoric  Affect:  Flat  Thought Process:  Coherent and Descriptions of Associations: Intact  Orientation:  Full (Time, Place, and Person)  Thought Content:  Delusions and Paranoid Ideation  Suicidal Thoughts:  No  Homicidal Thoughts:  No  Memory:  Immediate;   Fair Recent;   Fair Remote;   Fair  Judgement:  Intact  Insight:  Fair  Psychomotor Activity:  Normal  Concentration:  Fair  Recall:  [id number] of Knowledge:Fair  Language: Fair  Akathisia:  Negative  Handed:  Right  AIMS (if indicated):     Assets:  Desire for Improvement Resilience  Sleep:  Number of Hours: 6.5  Cognition: WNL  ADL's:  Intact   Mental Status Per Nursing Assessment::   On Admission:  NA  Demographic Factors:  Male, Caucasian, Low socioeconomic status, Living alone and Unemployed  Loss Factors: Financial problems/change in socioeconomic status  Historical Factors: Impulsivity  Risk Reduction Factors:   NA  Continued Clinical Symptoms:  Schizophrenia:   Less than 2 years old Paranoid or undifferentiated type  Cognitive Features That Contribute To Risk:  Thought constriction (tunnel vision)    Suicide Risk:  Minimal: No identifiable  suicidal ideation.  Patients presenting with no risk factors but with morbid ruminations; may be classified as minimal risk based on the severity of the depressive symptoms   Follow-up Information    [facility]. Call.   Why: A referral has been made to this provider for ACTT services. The provider will call you with the status of this referral, or you may call # [phone] for more information. Contact information: [address]  [address] [place] [phone]               Plan Of Care/Follow-up recommendations:  Activity:  ad lib Other:  Follow up with [facility] to receive your next Invega injection 156 mg on 12/21  1/22, MD 02/08/2020, 9:03 AM

## 2020-02-08 NOTE — Progress Notes (Signed)
Patient ID: Shane Allen, male   DOB: April 02, 1985, 34 y.o.   MRN: [id number] Patient discharged to home/self care in the presence of his family.  Patient denies SI, HI and AVH.  Patient acknowledged all understanding of discharge instructions and receipt of all personal belongings.
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
## 2020-02-08 NOTE — Plan of Care (Signed)
  Problem: Activity: Goal: Will verbalize the importance of balancing activity with adequate rest periods Outcome: Adequate for Discharge   Problem: Education: Goal: Will be free of psychotic symptoms Outcome: Adequate for Discharge Goal: Knowledge of the prescribed therapeutic regimen will improve Outcome: Adequate for Discharge   Problem: Coping: Goal: Coping ability will improve Outcome: Adequate for Discharge Goal: Will verbalize feelings Outcome: Adequate for Discharge   Problem: Health Behavior/Discharge Planning: Goal: Compliance with prescribed medication regimen will improve Outcome: Adequate for Discharge   Problem: Nutritional: Goal: Ability to achieve adequate nutritional intake will improve Outcome: Adequate for Discharge   Problem: Role Relationship: Goal: Ability to communicate needs accurately will improve Outcome: Adequate for Discharge Goal: Ability to interact with others will improve Outcome: Adequate for Discharge   Problem: Safety: Goal: Ability to redirect hostility and anger into socially appropriate behaviors will improve Outcome: Adequate for Discharge Goal: Ability to remain free from injury will improve Outcome: Adequate for Discharge   Problem: Self-Care: Goal: Ability to participate in self-care as condition permits will improve Outcome: Adequate for Discharge   Problem: Self-Concept: Goal: Will verbalize positive feelings about self Outcome: Adequate for Discharge  Patient discharged to home.

## 2020-02-08 NOTE — Progress Notes (Signed)
Recreation Therapy Notes  INPATIENT RECREATION TR PLAN  Patient Details Name: Shafin Pollio MRN: [id number] DOB: 16-Sep-1985 Today's Date: 02/08/2020  Rec Therapy Plan Is patient appropriate for Therapeutic Recreation?: Yes Treatment times per week: about 3 days Estimated Length of Stay: 5-7 days TR Treatment/Interventions: Group participation (Comment)  Discharge Criteria Pt will be discharged from therapy if:: Discharged Treatment plan/goals/alternatives discussed and agreed upon by:: Patient/family  Discharge Summary Short term goals set: See patient care plan. Short term goals met: Complete Progress toward goals comments: Groups attended Which groups?: Self-esteem,Other (Comment) (STEM) Reason goals not met: None Therapeutic equipment acquired: N/A Reason patient discharged from therapy: Discharge from hospital Pt/family agrees with progress & goals achieved: Yes Date patient discharged from therapy: 02/08/20    Victorino Sparrow, LRT/CTRS  Ria Comment, Ali Mohl A 02/08/2020, 12:02 PM

## 2020-02-08 NOTE — Progress Notes (Signed)
Recreation Therapy Notes  Date: 12.16.21 Time: 7408-1448 Location: [address] Dayroom  Group Topic: Self-Esteem  Goal Area(s) Addresses:  Patient will identify positive character traits about themselves. Patient will reflect on positive ways to increase self-esteem. Patient will verbalize benefit of increased self-esteem.  Behavioral Response: Engaged  Intervention: Blank license plates, Markers, Colored pencils  Activity: Personalized License Plates.  Patients were to create Allen personalized license plate to highlight some of the positive things about themselves.  Patients could highlights things such as were they are from, sports teams they like, personality traits, important people to them, etc.  Education:  Self-esteem, Discharge planning  Education Outcome: Acknowledges understanding/In group clarification offered/Needs additional education  Clinical Observations/Feedback: Pt stated one of his positive traits was being respectful.  Pt had Allen lot of random thoughts on his license plate.  Pt drew different faces in the screw holes of the license plate to represent the different faces of emotions.  Pt also drew Allen picture of Allen guy playing Allen joke on alkiseltzer.  Pt also talked about people changing every 7 years and stated "friends can sometimes be ass holes".  Pt was active and attentive during group session.  Pt kept wanting to add things to his license plate after he stated he was finished.  Pt was bright and pleasant throughout group activity.    Shane Allen, LRT/CTRS    Shane Allen, Shane Allen 02/08/2020 10:57 AM

## 2020-03-11 ENCOUNTER — Other Ambulatory Visit: Payer: Self-pay

## 2020-03-11 ENCOUNTER — Encounter (HOSPITAL_COMMUNITY): Payer: Self-pay

## 2020-03-11 ENCOUNTER — Emergency Department (HOSPITAL_COMMUNITY)
Admission: EM | Admit: 2020-03-11 | Discharge: 2020-03-12 | Disposition: A | Discharge status: Home / Self Care | Payer: Medicaid Other | Attending: Emergency Medicine | Admitting: Emergency Medicine

## 2020-03-11 DIAGNOSIS — F17200 Nicotine dependence, unspecified, uncomplicated: Secondary | ICD-10-CM | POA: Diagnosis present

## 2020-03-11 DIAGNOSIS — R45851 Suicidal ideations: Secondary | ICD-10-CM | POA: Insufficient documentation

## 2020-03-11 DIAGNOSIS — Z87891 Personal history of nicotine dependence: Secondary | ICD-10-CM | POA: Diagnosis not present

## 2020-03-11 DIAGNOSIS — F2089 Other schizophrenia: Secondary | ICD-10-CM | POA: Diagnosis not present

## 2020-03-11 DIAGNOSIS — F209 Schizophrenia, unspecified: Secondary | ICD-10-CM | POA: Diagnosis present

## 2020-03-11 DIAGNOSIS — Z046 Encounter for general psychiatric examination, requested by authority: Secondary | ICD-10-CM | POA: Diagnosis present
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
## 2020-03-12 ENCOUNTER — Encounter (HOSPITAL_COMMUNITY): Payer: Self-pay | Admitting: Registered Nurse

## 2020-03-12 LAB — COMPREHENSIVE METABOLIC PANEL
ALT: 19 U/L (ref 0–44)
AST: 21 U/L (ref 15–41)
Albumin: 4.7 g/dL (ref 3.5–5.0)
Alkaline Phosphatase: 63 U/L (ref 38–126)
Anion gap: 13 (ref 5–15)
BUN: 10 mg/dL (ref 6–20)
CO2: 23 mmol/L (ref 22–32)
Calcium: 9.4 mg/dL (ref 8.9–10.3)
Chloride: 102 mmol/L (ref 98–111)
Creatinine, Ser: 0.99 mg/dL (ref 0.61–1.24)
GFR, Estimated: >60 mL/min (ref >60)
Glucose, Bld: 88 mg/dL (ref 70–99)
Potassium: 3.6 mmol/L (ref 3.5–5.1)
Sodium: 138 mmol/L (ref 135–145)
Total Bilirubin: 0.4 mg/dL (ref 0.3–1.2)
Total Protein: 7.6 g/dL (ref 6.5–8.1)

## 2020-03-12 LAB — CBC
HCT: 44.7 % (ref 39.0–52.0)
Hemoglobin: 14.9 g/dL (ref 13.0–17.0)
MCH: 32.4 pg (ref 26.0–34.0)
MCHC: 33.3 g/dL (ref 30.0–36.0)
MCV: 97.2 fL (ref 80.0–100.0)
Platelets: 336 K/uL (ref 150–400)
RBC: 4.6 MIL/uL (ref 4.22–5.81)
RDW: 13.9 % (ref 11.5–15.5)
WBC: 9.7 K/uL (ref 4.0–10.5)
nRBC: 0 % (ref 0.0–0.2)

## 2020-03-12 LAB — RAPID URINE DRUG SCREEN, HOSP PERFORMED
Amphetamines: NOT DETECTED
Barbiturates: NOT DETECTED
Benzodiazepines: NOT DETECTED
Cocaine: NOT DETECTED
Opiates: NOT DETECTED
Tetrahydrocannabinol: NOT DETECTED

## 2020-03-12 LAB — SALICYLATE LEVEL: Salicylate Lvl: <7 mg/dL — ABNORMAL LOW (ref 7.0–30.0)

## 2020-03-12 LAB — ACETAMINOPHEN LEVEL: Acetaminophen (Tylenol), Serum: <10 ug/mL — ABNORMAL LOW (ref 10–30)

## 2020-03-12 LAB — ETHANOL: Alcohol, Ethyl (B): 12 mg/dL — ABNORMAL HIGH (ref <10)

## 2020-03-12 NOTE — Discharge Instructions (Addendum)
For your mental health needs, you are advised to follow up with one of the following providers at your earliest opportunity:       [facility]      [address]      [place], [place] [address]      [phone]      Ask about their Substance Abuse Intensive Outpatient Program.  They also offer psychiatry/medication management and therapy.  New patients are being seen in their walk-in clinic.  Walk-in hours are Monday - Thursday from 8:00 am - 11:00 am for psychiatry, and Friday from 1:00 pm - 4:00 pm for therapy.  Walk-in patients are seen on a first come, first served basis, so try to arrive as early as possible for the best chance of being seen the same day.       [facility]      [address], [address]      [place], [place] [address]      [phone]   For your shelter needs, contact the following service providers:       [facility] (operated by [facility]      [address] [place], [place] [address]      [phone]       [facility]      [address]      [place], [place] [address]      [phone]  For day shelter, emergency shelter and other supportive services for the homeless, contact the [facility]):       [facility]      [address] [place], [place] [address]      [phone]
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
## 2020-03-12 NOTE — Progress Notes (Signed)
..   Transition of Care [facility]) - Emergency Department Mini Assessment   Patient Details  Name: Shane Allen MRN: [id number] Date of Birth: 1985-05-08  [facility] CM/SW Contact:    Lossie Faes Tarpley-Carter, LCSWA Phone Number: 03/12/2020, 2:41 PM   Clinical Narrative: [facility] CM/CSW consulted with pt with nurse at bedside.  Pt is in need of transportation home.  Obera Stauch Tarpley-Carter, MSW, LCSW-A Pronouns:  She, Her, Hers                  [facility] Transitions of CareClinical Social Worker [email or web address] [phone]   ED Mini Assessment: What brought you to the Emergency Department? : No chief complaint  Barriers to Discharge: No Barriers Identified     Means of departure: Public Transportation  Interventions which prevented an admission or readmission: Transportation Screening    Patient Contact and Communications       Contact Date: 03/12/20,          Patient states their goals for this hospitalization and ongoing recovery are:: Pt is in need of transportation home. CMS Medicare.gov Compare Post Acute Care list provided to:: Patient Choice offered to / list presented to : Patient  Admission diagnosis:  IVC'ed Patient Active Problem List   Diagnosis Date Noted  . Schizophrenia (HCC) 01/30/2020  . Noncompliance 03/27/2016  . 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 Schizophrenia, unspecified type (HCC) 10/27/2014  . Tobacco use disorder 10/27/2014   PCP:  Vesta Mixer Pharmacy:   [facility] [phone] - [place], [place] - [address] AT [facility] [address] [place] [address] Phone: [phone] Fax: [phone]

## 2020-03-12 NOTE — BH Assessment (Signed)
10:38 am TTS received message from staff Nurse that pt was requesting to speak to psych again . Spoke with pt who wanted to know when he would be discharged . TTS staff advised that the provider would make the decision when pt could be discharged. Pt advised that he has spoken to provider and they have given him the information needed. TTS

## 2020-03-12 NOTE — BH Assessment (Addendum)
[facility] Assessment Progress Note  Per Shuvon Rankin, NP, this pt does not require psychiatric hospitalization at this time.  She has reportedly spoken to pt's mother, Shirlean Mylar, obtaining collateral information and exercising duty to warn.  Pt presents under IVC initiated by pt's mother and upheld by EDP Ross Marcus, MD.  Pt is psychiatrically cleared.  Pt has reportedly received outpatient treatment through [facility], possibly including injectable psychotropic medications.  At [facility] request this writer called them to inquire about appointments and injectables.  Call was placed at 11:34 and I left a voice message.  Return call is pending as of this writing.  EDP Cherlynn Perches, MD has been notified and he agrees to rescind IVC once [facility] arrangements are solidified.  Pt's nurse, Florentina Addison, and charge nurse Patty have also been notified.  Final disposition is pending as of this writing.  Doylene Canning, MA Triage Specialist [phone]  Addendum:  At 12:47 I reached Misty Stanley at [facility].  She reports that pt has not received injectable medications from them any time recently.  He was discharged from their service for missing appointments on 02/12/2020.  This was staffed with [facility], and she reports that no further behavioral health services are required for pt for this ED encounter.  Discharge instructions have been modified to include [facility], as well as Transport planner.  Information regarding area supportive services for the homeless have also been included.  EDP Cherlynn Perches, MD has rescinded IVC.  Katie and Alexia Freestone have been notified.  Doylene Canning, [place] Behavioral Health Coordinator [phone]

## 2020-03-12 NOTE — ED Provider Notes (Signed)
Patient is medically screened and seen by psychiatry and safe for outpatient medications therapy.  Patient agrees.  Family is informed.  Discharge home.   Sabino Donovan, MD 03/12/20 [phone]
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
## 2020-03-12 NOTE — ED Notes (Signed)
Pt's belongings were placed in one white bag kept at the nurse's station in triage.

## 2020-03-12 NOTE — Progress Notes (Signed)
TOC CM/CSW consulted with pt about transportation.  Pt was given bus pass.  Pt thanked for bus passes.  CSW will continue to follow for dc needs.  Brand Siever Tarpley-Carter, MSW, LCSW-A Pronouns:  She, Her, Hers                  [facility] ED Transitions of CareClinical Social Worker [email or web address] [phone]
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
## 2020-03-12 NOTE — BH Assessment (Addendum)
Nira Conn, NP, recommends overnight observation for safety and stabilization with psych reassessment in the AM. Patient is under IVC and will remain at [facility] for observation.

## 2020-03-12 NOTE — ED Notes (Signed)
Shane Allen, mom, [phone], [phone], please call.

## 2021-02-14 ENCOUNTER — Ambulatory Visit (HOSPITAL_COMMUNITY)
Admission: EM | Admit: 2021-02-14 | Discharge: 2021-02-15 | Disposition: A | Discharge status: Home / Self Care | Payer: Medicaid Other | Attending: Urology | Admitting: Urology

## 2021-02-14 DIAGNOSIS — Y908 Blood alcohol level of 240 mg/100 ml or more: Secondary | ICD-10-CM | POA: Insufficient documentation

## 2021-02-14 DIAGNOSIS — Z20822 Contact with and (suspected) exposure to covid-19: Secondary | ICD-10-CM | POA: Insufficient documentation

## 2021-02-14 DIAGNOSIS — F209 Schizophrenia, unspecified: Secondary | ICD-10-CM

## 2021-02-14 DIAGNOSIS — F200 Paranoid schizophrenia: Secondary | ICD-10-CM | POA: Insufficient documentation

## 2021-02-14 DIAGNOSIS — F10129 Alcohol abuse with intoxication, unspecified: Secondary | ICD-10-CM | POA: Insufficient documentation

## 2021-02-14 DIAGNOSIS — R45851 Suicidal ideations: Secondary | ICD-10-CM | POA: Insufficient documentation

## 2021-02-14 DIAGNOSIS — F17210 Nicotine dependence, cigarettes, uncomplicated: Secondary | ICD-10-CM | POA: Insufficient documentation

## 2021-02-14 MED ORDER — LORAZEPAM 1 MG PO TABS
2.0000 mg | ORAL_TABLET | Freq: Once | ORAL | Status: AC
  Administered 2021-02-15: 01:00:00 2 mg via ORAL
  Filled 2021-02-14: qty 2

## 2021-02-14 MED ORDER — TRAZODONE HCL 50 MG PO TABS
50.0000 mg | ORAL_TABLET | Freq: Every evening | ORAL | Status: DC | PRN
  Administered 2021-02-15: 01:00:00 50 mg via ORAL
  Filled 2021-02-14: qty 1

## 2021-02-14 MED ORDER — ALUM & MAG HYDROXIDE-SIMETH 200-200-20 MG/5ML PO SUSP: 30.0000 mL | ORAL | Status: DC | PRN

## 2021-02-14 MED ORDER — THIAMINE HCL 100 MG/ML IJ SOLN
100.0000 mg | Freq: Once | INTRAMUSCULAR | Status: AC
  Administered 2021-02-15: 01:00:00 100 mg via INTRAMUSCULAR
  Filled 2021-02-14: qty 2

## 2021-02-14 MED ORDER — MAGNESIUM HYDROXIDE 400 MG/5ML PO SUSP: 30.0000 mL | Freq: Every day | ORAL | Status: DC | PRN

## 2021-02-14 MED ORDER — HYDROXYZINE HCL 25 MG PO TABS: 25.0000 mg | ORAL_TABLET | Freq: Four times a day (QID) | ORAL | Status: DC | PRN

## 2021-02-14 MED ORDER — LOPERAMIDE HCL 2 MG PO CAPS
2.0000 mg | ORAL_CAPSULE | ORAL | Status: DC | PRN
Start: 2021-02-14 — End: 2021-02-15

## 2021-02-14 MED ORDER — LORAZEPAM 1 MG PO TABS: 1.0000 mg | ORAL_TABLET | Freq: Four times a day (QID) | ORAL | Status: DC | PRN

## 2021-02-14 MED ORDER — ONDANSETRON 4 MG PO TBDP: 4.0000 mg | ORAL_TABLET | Freq: Four times a day (QID) | ORAL | Status: DC | PRN
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
## 2021-02-15 DIAGNOSIS — F200 Paranoid schizophrenia: Secondary | ICD-10-CM | POA: Diagnosis not present

## 2021-02-15 DIAGNOSIS — F10129 Alcohol abuse with intoxication, unspecified: Secondary | ICD-10-CM | POA: Diagnosis not present

## 2021-02-15 DIAGNOSIS — Z20822 Contact with and (suspected) exposure to covid-19: Secondary | ICD-10-CM | POA: Diagnosis not present

## 2021-02-15 DIAGNOSIS — R45851 Suicidal ideations: Secondary | ICD-10-CM | POA: Diagnosis not present

## 2021-02-15 LAB — COMPREHENSIVE METABOLIC PANEL
ALT: 20 U/L (ref 0–44)
AST: 24 U/L (ref 15–41)
Albumin: 4.7 g/dL (ref 3.5–5.0)
Alkaline Phosphatase: 60 U/L (ref 38–126)
Anion gap: 14 (ref 5–15)
BUN: 10 mg/dL (ref 6–20)
CO2: 24 mmol/L (ref 22–32)
Calcium: 9 mg/dL (ref 8.9–10.3)
Chloride: 101 mmol/L (ref 98–111)
Creatinine, Ser: 0.81 mg/dL (ref 0.61–1.24)
GFR, Estimated: >60 mL/min (ref >60)
Glucose, Bld: 58 mg/dL — ABNORMAL LOW (ref 70–99)
Potassium: 4.2 mmol/L (ref 3.5–5.1)
Sodium: 139 mmol/L (ref 135–145)
Total Bilirubin: 0.6 mg/dL (ref 0.3–1.2)
Total Protein: 7.5 g/dL (ref 6.5–8.1)

## 2021-02-15 LAB — LIPID PANEL
Cholesterol: 208 mg/dL — ABNORMAL HIGH (ref 0–200)
HDL: 85 mg/dL (ref >40)
LDL Cholesterol: 101 mg/dL — ABNORMAL HIGH (ref 0–99)
Total CHOL/HDL Ratio: 2.4 RATIO
Triglycerides: 108 mg/dL (ref <150)
VLDL: 22 mg/dL (ref 0–40)

## 2021-02-15 LAB — CBC WITH DIFFERENTIAL/PLATELET
Abs Immature Granulocytes: 0.05 10*3/uL (ref 0.00–0.07)
Basophils Absolute: 0.1 10*3/uL (ref 0.0–0.1)
Basophils Relative: 1 %
Eosinophils Absolute: 0.1 10*3/uL (ref 0.0–0.5)
Eosinophils Relative: 1 %
HCT: 49.1 % (ref 39.0–52.0)
Hemoglobin: 17 g/dL (ref 13.0–17.0)
Immature Granulocytes: 0 %
Lymphocytes Relative: 17 %
Lymphs Abs: 2 10*3/uL (ref 0.7–4.0)
MCH: 33.4 pg (ref 26.0–34.0)
MCHC: 34.6 g/dL (ref 30.0–36.0)
MCV: 96.5 fL (ref 80.0–100.0)
Monocytes Absolute: 0.7 10*3/uL (ref 0.1–1.0)
Monocytes Relative: 6 %
Neutro Abs: 8.8 10*3/uL — ABNORMAL HIGH (ref 1.7–7.7)
Neutrophils Relative %: 75 %
Platelets: 278 10*3/uL (ref 150–400)
RBC: 5.09 MIL/uL (ref 4.22–5.81)
RDW: 12.8 % (ref 11.5–15.5)
WBC: 11.7 10*3/uL — ABNORMAL HIGH (ref 4.0–10.5)
nRBC: 0 % (ref 0.0–0.2)

## 2021-02-15 LAB — POCT URINE DRUG SCREEN - MANUAL ENTRY (I-SCREEN)
POC Amphetamine UR: NOT DETECTED
POC Buprenorphine (BUP): NOT DETECTED
POC Cocaine UR: NOT DETECTED
POC Marijuana UR: NOT DETECTED
POC Methadone UR: NOT DETECTED
POC Methamphetamine UR: NOT DETECTED
POC Morphine: NOT DETECTED
POC Oxazepam (BZO): NOT DETECTED
POC Oxycodone UR: NOT DETECTED
POC Secobarbital (BAR): NOT DETECTED

## 2021-02-15 LAB — HEMOGLOBIN A1C
Hgb A1c MFr Bld: 5.1 % (ref 4.8–5.6)
Mean Plasma Glucose: 99.67 mg/dL

## 2021-02-15 LAB — ETHANOL: Alcohol, Ethyl (B): 275 mg/dL — ABNORMAL HIGH (ref <10)

## 2021-02-15 LAB — POC SARS CORONAVIRUS 2 AG -  ED: SARS Coronavirus 2 Ag: NEGATIVE

## 2021-02-15 LAB — RESP PANEL BY RT-PCR (FLU A&B, COVID) ARPGX2
Influenza A by PCR: NEGATIVE
Influenza B by PCR: NEGATIVE
SARS Coronavirus 2 by RT PCR: NEGATIVE

## 2021-02-15 LAB — TSH: TSH: 0.215 u[IU]/mL — ABNORMAL LOW (ref 0.350–4.500)
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
## 2021-02-15 NOTE — BH Assessment (Signed)
Comprehensive Clinical Assessment (CCA) Note  02/15/2021 Shane Allen [id number]  Disposition: Shane Reasoner, NP recommends pt to be admitted to [facility].   [place] ED from 02/14/2021 in [facility] Admission (Discharged) from 01/29/2020 in [place] [facility] ED from 01/28/2020 in [place] DEPT  C-SSRS RISK CATEGORY No Risk No Risk No Risk      The patient demonstrates the following risk factors for suicide: Chronic risk factors for suicide include: psychiatric disorder of Schizophrenia ([place]) and substance use disorder. Acute risk factors for suicide include: N/A. Protective factors for this patient include: positive social support. Considering these factors, the overall suicide risk at this point appears to be no risk. Patient is appropriate for outpatient follow up.  Shane Allen is 35 years old presents involuntary and unaccompanied to [facility]. Clinician asked the pt, "what brought you to the hospital?" Pt reports, he hit his head when he fell. Pt repeated he's not dangerous to anybody, he had an argument with his mother today (02/14/2021). Per pt his mother told him to shut his mouth; his mother was talking about a church Engineer, manufacturing. Per pt, he slipped up and said something. Pt denies, SI, HI, AVH, self-injurious behaviors and access to weapons.   Pt was IVC'd by his mother Shane Allen. Jodi Mourning, [phone]). Per IVC paperwork: "Respondent suffers from Schizophrenia and Paranoia, He is currently not taking his medication and is showing signs of suicidal tendencies and has become violent toward his family. Respondent has become aggressive towards his hallucinations. Respondent has stated that he would kill all of them speaking to his voices he is hearing in his head. Respondent has been self medicating by drinking alcohol all day. Respondent has become a danger to his family and all around him."  Pt denies, content of IVC.   Pt reports, drinking 2, 40 oz beers today celebrating the holidays. Pt reports, he drinks occasionally. Pt's BAL is pending. Pt's UDS is negative. Per [place] he seen about 30 empty beer cans in his room. Pt reports, he has not taking medications in months. Pt reports, he couldn't afford it, "I'm not crazy." Per pt, he was prescribed Invega (tablets), he had bad side effects but did not give examples of side effects experienced. Pt denies, previous inpatient admissions.   Pt presents heavily intoxicated with normal speech. Pt's mood. was anxious. Pt's affect was congruent. Pt's insight was poor. Pt's judgement was impaired. During the assessment pt asked NP if he could be discharged. Pt reports, he can contract for safety if discharged.  Diagnosis: Schizophrenia ([place]).  *Clinician and NP attempted to contact pt's mother/IVC petitioner twice, initially the phone rang then went to voicemail with the following message: "the mailbox is full and can not accept any messages." Clinician called again the call went straight to voicemail with the same voice message.*  Chief Complaint:  Chief Complaint  Patient presents with   Schizophrenia   Aggressive Behavior   Visit Diagnosis:     CCA Screening, Triage and Referral (STR)  Patient Reported Information How did you hear about [place]? Legal System  What Is the Reason for Your Visit/Call Today? Pt presents under IVC due to SI, violent behaviors towards family, AVH and alcohol use.  How Long Has This Been Causing You Problems? > than 6 months  What Do You Feel Would Help You the Most Today? Alcohol or Drug Use Treatment; Medication(s)   Have You Recently Had Any Thoughts  About Hurting Yourself? No (Per IVC the pt "is showing signs of suicidal tendencies" however the pt denies.)  Are You Planning to Commit Suicide/Harm Yourself At This time? No   Have you Recently Had Thoughts About Hurting Someone Shane Allen? No (Per IVC the  pt "has become violent towards his family.")  Are You Planning to Harm Someone at This Time? No  Explanation: No data recorded  Have You Used Any Alcohol or Drugs in the Past 24 Hours? Yes  How Long Ago Did You Use Drugs or Alcohol? No data recorded What Did You Use and How Much? Pt reports drinking 2 40 oz beers today. Pt reports, he drinks occassionally. Per sheriff, he seen about 30 empty beer cans in the pts room.   Do You Currently Have a Therapist/Psychiatrist? No  Name of Therapist/Psychiatrist: No data recorded  Have You Been Recently Discharged From Any Office Practice or Programs? No data recorded Explanation of Discharge From Practice/Program: No data recorded    CCA Screening Triage Referral Assessment Type of Contact: Face-to-Face  Telemedicine Service Delivery:   Is this Initial or Reassessment? No data recorded Date Telepsych consult ordered in CHL:  No data recorded Time Telepsych consult ordered in CHL:  No data recorded Location of Assessment: [facility] Assessment Services  Provider Location: [facility] Assessment Services   Collateral Involvement: Sharyn Lull. Clearance Coots, IVC petitioner/mother, [phone].   Does Patient Have a Automotive engineer Guardian? No data recorded Name and Contact of Legal Guardian: No data recorded If Minor and Not Living with Parent(s), Who has Custody? NA  Is CPS involved or ever been involved? No data recorded Is APS involved or ever been involved? No data recorded  Patient Determined To Be At Risk for Harm To Self or Others Based on Review of Patient Reported Information or Presenting Complaint? No data recorded Method: No data recorded Availability of Means: No data recorded Intent: No data recorded Notification Required: No data recorded Additional Information for Danger to Others Potential: No data recorded Additional Comments for Danger to Others Potential: No data recorded Are There Guns or Other Weapons in Your Home? No  data recorded Types of Guns/Weapons: No data recorded Are These Weapons Safely Secured?                            No data recorded Who Could Verify You Are Able To Have These Secured: No data recorded Do You Have any Outstanding Charges, Pending Court Dates, Parole/Probation? No data recorded Contacted To Inform of Risk of Harm To Self or Others: No data recorded   Does Patient Present under Involuntary Commitment? Yes  IVC Papers Initial File Date: 02/14/21   [place] of Residence: [place]   Patient Currently Receiving the Following Services: Not Receiving Services   Determination of Need: Urgent (48 hours)   Options For Referral: Facility-Based Crisis; Inpatient Hospitalization; Medication Management; Outpatient Therapy; BH Urgent Care     CCA Biopsychosocial Patient Reported Schizophrenia/Schizoaffective Diagnosis in Past: No data recorded  Strengths: Pt reported, his hobbies.   Mental Health Symptoms Depression:   Sleep (too much or little); Difficulty Concentrating; Change in energy/activity; Fatigue   Duration of Depressive symptoms:    Mania:   None   Anxiety:    None   Psychosis:   -- (Per IVC however pt denies.)   Duration of Psychotic symptoms:    Trauma:   -- (Pt denies.)   Obsessions:   None  Compulsions:   None; "Driven" to perform behaviors/acts   Inattention:   None   Hyperactivity/Impulsivity:   N/A   Oppositional/Defiant Behaviors:   None (Per IVC paperwork pt has become violent towards his family however pt denies.)   Emotional Irregularity:   -- (Per IVC)   Other Mood/Personality Symptoms:  No data recorded   Mental Status Exam Appearance and self-care  Stature:   Average   Weight:   Average weight   Clothing:   Disheveled   Grooming:   Neglected   Cosmetic use:   None   Posture/gait:   Normal   Motor activity:   Not Remarkable   Sensorium  Attention:   Distractible   Concentration:   -- (Poor)    Orientation:   Person; Place   Recall/memory:   Defective in Immediate   Affect and Mood  Affect:   Congruent   Mood:   Anxious   Relating  Eye contact:   Normal   Facial expression:   Anxious   Attitude toward examiner:   Cooperative   Thought and Language  Speech flow:  Normal   Thought content:   -- (Thought blocking.)   Preoccupation:   None   Hallucinations:   None (Pt denies.)   Organization:  No data recorded  [employer] of Knowledge:   Poor   Intelligence:   Average   Abstraction:   -- (UTA)   Judgement:   Impaired   Reality Testing:   -- (UTA)   Insight:   Poor   Decision Making:   Impulsive   Social Functioning  Social Maturity:   Impulsive   Social Judgement:   -- (UTA)   Stress  Stressors:   Other (Comment) (Pt denies, stressors.)   Coping Ability:   Overwhelmed   Skill Deficits:   Theatre stage manager; Decision making   Supports:   Family     Religion: Religion/Spirituality Are You A Religious Person?: No  Leisure/Recreation: Leisure / Recreation Do You Have Hobbies?: No  Exercise/Diet: Exercise/Diet Do You Follow a Special Diet?: No Explanation of Sleeping Difficulties: Pt reports, getting 10 hours of sleep.   CCA Employment/Education Employment/Work Situation: Employment / Work Situation Employment Situation: On disability Why is Patient on Disability: Per pt, "because I can't take care of myself." Pt reports, he's his own guardian. How Long has Patient Been on Disability: Per pt, 4 years. Has Patient ever Been in the [place]?: No  Education: Education Is Patient Currently Attending School?: No Last Grade Completed: 8 Did You Attend College?: No   CCA Family/Childhood History Family and Relationship History: Family history Marital status: Single Does patient have children?: No  Childhood History:  Childhood History By whom was/is the patient raised?: Both  parents (Per chart.) Did patient suffer any verbal/emotional/physical/sexual abuse as a child?: No (Pt denies.) Did patient suffer from severe childhood neglect?: No (Pt denies.) Has patient ever been sexually abused/assaulted/raped as an adolescent or adult?: No (Pt denies.) Witnessed domestic violence?: No (Pt denies.) Has patient been affected by domestic violence as an adult?:  (NA)  Child/Adolescent Assessment:     CCA Substance Use Alcohol/Drug Use: Alcohol / Drug Use Pain Medications: See MAR Prescriptions: See MAR Over the Counter: See MAR History of alcohol / drug use?: Yes    ASAM's:  Six Dimensions of Multidimensional Assessment  Dimension 1:  Acute Intoxication and/or Withdrawal Potential:      Dimension 2:  Biomedical Conditions and Complications:  Dimension 3:  Emotional, Behavioral, or Cognitive Conditions and Complications:     Dimension 4:  Readiness to Change:     Dimension 5:  Relapse, Continued use, or Continued Problem Potential:     Dimension 6:  Recovery/Living Environment:     ASAM Severity Score:    ASAM Recommended Level of Treatment:     Substance use Disorder (SUD)    Recommendations for Services/Supports/Treatments: Recommendations for Services/Supports/Treatments Recommendations For Services/Supports/Treatments: Other (Comment) (Pt to be admitted to [facility] Continuous Assessment.)  Discharge Disposition:    DSM5 Diagnoses: Patient Active Problem List   Diagnosis Date Noted   Schizophrenia ([place]) 01/30/2020   Noncompliance 03/27/2016   Schizophrenia, 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   Schizophrenia, unspecified type ([place]) 10/27/2014   Tobacco use disorder 10/27/2014     Referrals to Alternative Service(s): Referred to Alternative Service(s):   Place:   Date:   Time:    Referred to Alternative Service(s):   Place:   Date:   Time:    Referred to Alternative Service(s):   Place:   Date:   Time:    Referred to Alternative  Service(s):   Place:   Date:   Time:     Vertell Novak, [facility] Comprehensive Clinical Assessment (CCA) Screening, Triage and Referral Note  02/15/2021 Breydan Cova [id number]  Chief Complaint:  Chief Complaint  Patient presents with   Schizophrenia   Aggressive Behavior   Visit Diagnosis:   Patient Reported Information How did you hear about [place]? Legal System  What Is the Reason for Your Visit/Call Today? Pt presents under IVC due to SI, violent behaviors towards family, AVH and alcohol use.  How Long Has This Been Causing You Problems? > than 6 months  What Do You Feel Would Help You the Most Today? Alcohol or Drug Use Treatment; Medication(s)   Have You Recently Had Any Thoughts About Hurting Yourself? No (Per IVC the pt "is showing signs of suicidal tendencies" however the pt denies.)  Are You Planning to Commit Suicide/Harm Yourself At This time? No   Have you Recently Had Thoughts About [place]? No (Per IVC the pt "has become violent towards his family.")  Are You Planning to Harm Someone at This Time? No  Explanation: No data recorded  Have You Used Any Alcohol or Drugs in the Past 24 Hours? Yes  How Long Ago Did You Use Drugs or Alcohol? No data recorded What Did You Use and How Much? Pt reports drinking 2 40 oz beers today. Pt reports, he drinks occassionally. Per sheriff, he seen about 30 empty beer cans in the pts room.   Do You Currently Have a Therapist/Psychiatrist? No  Name of Therapist/Psychiatrist: No data recorded  Have You Been Recently Discharged From Any Office Practice or Programs? No data recorded Explanation of Discharge From Practice/Program: No data recorded   CCA Screening Triage Referral Assessment Type of Contact: Face-to-Face  Telemedicine Service Delivery:   Is this Initial or Reassessment? No data recorded Date Telepsych consult ordered in CHL:  No data recorded Time Telepsych consult ordered in CHL:  No data  recorded Location of Assessment: [facility] Assessment Services  Provider Location: [facility] Assessment Services   Collateral Involvement: Shane Allen. Jodi Mourning, IVC petitioner/mother, [phone].   Does Patient Have a Stage manager Guardian? No data recorded Name and Contact of Legal Guardian: No data recorded If Minor and Not Living with Parent(s), Who has Custody? NA  Is CPS involved or ever been involved?  No data recorded Is APS involved or ever been involved? No data recorded  Patient Determined To Be At Risk for Harm To Self or Others Based on Review of Patient Reported Information or Presenting Complaint? No data recorded Method: No data recorded Availability of Means: No data recorded Intent: No data recorded Notification Required: No data recorded Additional Information for Danger to Others Potential: No data recorded Additional Comments for Danger to Others Potential: No data recorded Are There Guns or Other Weapons in Your Home? No data recorded Types of Guns/Weapons: No data recorded Are These Weapons Safely Secured?                            No data recorded Who Could Verify You Are Able To Have These Secured: No data recorded Do You Have any Outstanding Charges, Pending Court Dates, Parole/Probation? No data recorded Contacted To Inform of Risk of Harm To Self or Others: No data recorded  Does Patient Present under Involuntary Commitment? Yes  IVC Papers Initial File Date: 02/14/21   [place] of Residence: [place]   Patient Currently Receiving the Following Services: Not Receiving Services   Determination of Need: Urgent (48 hours)   Options For Referral: Facility-Based Crisis; Inpatient Hospitalization; Medication Management; Outpatient Therapy; [place] [facility]   Discharge Disposition:     Vertell Novak, [place], [place], [facility], [facility] Triage Specialist [phone]

## 2021-02-15 NOTE — ED Notes (Signed)
Patient A&O x 4, ambulatory. Patient discharged in no acute distress. Patient denied SI/HI, A/VH upon discharge. Patient verbalized understanding of all discharge instructions explained by staff, to include follow up appointments, RX's and safety plan. Pt belongings returned to patient from locker #25 intact. Patient escorted to lobby via staff for transport to destination. Safety maintained.  

## 2021-02-15 NOTE — Discharge Instructions (Addendum)
Take all medications as prescribed. Keep all follow-up appointments as scheduled.  Do not consume alcohol or use illegal drugs while on prescription medications. Report any adverse effects from your medications to your primary care provider promptly.  In the event of recurrent symptoms or worsening symptoms, call 911, a crisis hotline, or go to the nearest emergency department for evaluation.   

## 2021-02-17 LAB — PROLACTIN: Prolactin: 10.1 ng/mL (ref 4.0–15.2)

## 2021-02-25 ENCOUNTER — Telehealth (HOSPITAL_COMMUNITY): Payer: Self-pay

## 2021-02-25 NOTE — BH Assessment (Signed)
Care Management - [facility] Follow Up Discharges   Writer attempted to make contact with patient today and was unsuccessful.  Writer left a HIPPA compliant voice message.   Per chart review, patient will follow up with his established provider Dr. Kathi Ludwig with [facility] for medication management. [name]

## 2021-04-03 ENCOUNTER — Ambulatory Visit: Payer: Self-pay | Admitting: *Deleted

## 2021-04-03 NOTE — Telephone Encounter (Signed)
°  Chief Complaint: Mother- calling with concerns about patient Symptoms: isolation, not eating, off medications Frequency: 4 days not eating Pertinent Negatives:  Disposition: [] ED /[] Urgent Care (no appt availability in office) / [] Appointment(In office/virtual)/ []  [facility]/ [] Home Care/ [] Refused Recommended Disposition /[] [place] [facility]/ [x]  Follow-up with PCP Additional Notes: Patient is schizophrenic patient. Mother is concerned that patient has stopped medication, not eating and isolating. Advised reach out to PCP, therapist regarding behavior they will advise her on next steps. Also advised support group for support for her.

## 2021-04-03 NOTE — Telephone Encounter (Signed)
Reason for Disposition  Nursing judgment or information in reference  Answer Assessment - Initial Assessment Questions 1. REASON FOR CALL: "What is your main concern right now?"     Mother is calling with concerns about patient well being 2. ONSET: "When did the mental deterioration start?"     07/19/2012- patient stopped medication- patient has been suffering since 3. SEVERITY: "How bad is the disorder?"     Patient is not cooperating with care- patient is patient at [facility] 4. FEVER: "Do you have a fever?"     no 5. OTHER SYMPTOMS: "Do you have any other new symptoms?"     Not eating, drinking very little 6. TREATMENTS AND RESPONSE: "What have you done so far to try to make this better? What medicines have you used?"     Patient is isolating himself, not eating 7. PREGNANCY: "Is there any chance you are pregnant?" "When was your last menstrual period?"  Protocols used: No Guideline Available-A-AH

## 2021-09-08 ENCOUNTER — Emergency Department (EMERGENCY_DEPARTMENT_HOSPITAL)
Admission: EM | Admit: 2021-09-08 | Discharge: 2021-09-09 | Disposition: A | Discharge status: Other Acute Inpatient Hospital | Payer: Medicaid Other | Source: Home / Self Care | Attending: Emergency Medicine | Admitting: Emergency Medicine

## 2021-09-08 ENCOUNTER — Other Ambulatory Visit: Payer: Self-pay

## 2021-09-08 ENCOUNTER — Encounter (HOSPITAL_COMMUNITY): Payer: Self-pay

## 2021-09-08 DIAGNOSIS — Z046 Encounter for general psychiatric examination, requested by authority: Secondary | ICD-10-CM | POA: Insufficient documentation

## 2021-09-08 DIAGNOSIS — Z20822 Contact with and (suspected) exposure to covid-19: Secondary | ICD-10-CM | POA: Insufficient documentation

## 2021-09-08 DIAGNOSIS — F209 Schizophrenia, unspecified: Secondary | ICD-10-CM | POA: Diagnosis present

## 2021-09-08 DIAGNOSIS — R232 Flushing: Secondary | ICD-10-CM | POA: Insufficient documentation

## 2021-09-08 DIAGNOSIS — F32A Depression, unspecified: Secondary | ICD-10-CM

## 2021-09-08 LAB — COMPREHENSIVE METABOLIC PANEL
ALT: 24 U/L (ref 0–44)
AST: 33 U/L (ref 15–41)
Albumin: 4.8 g/dL (ref 3.5–5.0)
Alkaline Phosphatase: 83 U/L (ref 38–126)
Anion gap: 11 (ref 5–15)
BUN: 17 mg/dL (ref 6–20)
CO2: 22 mmol/L (ref 22–32)
Calcium: 9.7 mg/dL (ref 8.9–10.3)
Chloride: 104 mmol/L (ref 98–111)
Creatinine, Ser: 1.02 mg/dL (ref 0.61–1.24)
GFR, Estimated: >60 mL/min (ref >60)
Glucose, Bld: 101 mg/dL — ABNORMAL HIGH (ref 70–99)
Potassium: 4 mmol/L (ref 3.5–5.1)
Sodium: 137 mmol/L (ref 135–145)
Total Bilirubin: 1 mg/dL (ref 0.3–1.2)
Total Protein: 8 g/dL (ref 6.5–8.1)

## 2021-09-08 LAB — CBC WITH DIFFERENTIAL/PLATELET
Abs Immature Granulocytes: 0.02 10*3/uL (ref 0.00–0.07)
Basophils Absolute: 0.1 10*3/uL (ref 0.0–0.1)
Basophils Relative: 1 %
Eosinophils Absolute: 0.1 10*3/uL (ref 0.0–0.5)
Eosinophils Relative: 1 %
HCT: 48.5 % (ref 39.0–52.0)
Hemoglobin: 16.6 g/dL (ref 13.0–17.0)
Immature Granulocytes: 0 %
Lymphocytes Relative: 16 %
Lymphs Abs: 1.3 10*3/uL (ref 0.7–4.0)
MCH: 33.2 pg (ref 26.0–34.0)
MCHC: 34.2 g/dL (ref 30.0–36.0)
MCV: 97 fL (ref 80.0–100.0)
Monocytes Absolute: 1.4 10*3/uL — ABNORMAL HIGH (ref 0.1–1.0)
Monocytes Relative: 17 %
Neutro Abs: 5.4 10*3/uL (ref 1.7–7.7)
Neutrophils Relative %: 65 %
Platelets: 272 10*3/uL (ref 150–400)
RBC: 5 MIL/uL (ref 4.22–5.81)
RDW: 15.8 % — ABNORMAL HIGH (ref 11.5–15.5)
WBC: 8.3 10*3/uL (ref 4.0–10.5)
nRBC: 0 % (ref 0.0–0.2)

## 2021-09-08 LAB — SALICYLATE LEVEL: Salicylate Lvl: <7 mg/dL — ABNORMAL LOW (ref 7.0–30.0)

## 2021-09-08 LAB — ETHANOL: Alcohol, Ethyl (B): <10 mg/dL (ref <10)

## 2021-09-08 LAB — ACETAMINOPHEN LEVEL: Acetaminophen (Tylenol), Serum: <10 ug/mL — ABNORMAL LOW (ref 10–30)

## 2021-09-08 NOTE — ED Provider Triage Note (Signed)
Emergency Medicine Provider Triage Evaluation Note  Ethanjames Fontenot , a 36 y.o. male  was evaluated in triage.  Pt brought in under IVC.   Per IVC paperwork, " respondent is diagnosed with schizophrenia.  This week respondent began self harming.  Respondent is beating his hands into items at home, causing his hands to bleed.  Responded is not eating regularly, has been drinking every day consistently for the last 3 weeks per petitioner.  Responds verbally stating threats to people who do not exist.  When petitioner tries asked respondent questions, he is not responding in full sentences."  On my exam patient's irritable.  Is denying suicidal or homicidal ideation, denying AVH.  However he continues to perseverate over whether or not he was going to be discharged.  He does not want to be given any medication.  When asked if he supposed to be on any medication, patient states "I just have to do what I have to do".  Review of Systems  As above  Physical Exam  BP (!) 140/92 (BP Location: Left Arm)   Pulse 92   Temp 97.9 F (36.6 C) (Oral)   Resp 18   Ht 5\' 11"  (1.803 m)   Wt 92.1 kg   SpO2 99%   BMI 28.31 kg/m  Gen:   Awake, no distress   Resp:  Normal effort  MSK:   Moves extremities without difficulty  Other:    Medical Decision Making  Medically screening exam initiated at 10:00 PM.  Appropriate orders placed.  Welby Montminy was informed that the remainder of the evaluation will be completed by another provider, this initial triage assessment does not replace that evaluation, and the importance of remaining in the ED until their evaluation is complete.  Will initiate medical clearance   [name], PA-C 09/08/21 2202

## 2021-09-08 NOTE — ED Triage Notes (Signed)
Pt BIB police with reports of self harming. Pt has markings to his fists but states that it is from working. Pt denies SI/HI.

## 2021-09-08 NOTE — ED Notes (Signed)
Pt is calm and in his room. Was told to do blank notes q2hrs with observations of the pt. No SI or HI documenting at this time.

## 2021-09-08 NOTE — ED Notes (Signed)
Sandwich given 

## 2021-09-09 ENCOUNTER — Encounter (HOSPITAL_COMMUNITY): Payer: Self-pay | Admitting: Student

## 2021-09-09 ENCOUNTER — Inpatient Hospital Stay (HOSPITAL_COMMUNITY)
Admission: AD | Admit: 2021-09-09 | Discharge: 2021-09-18 | DRG: 885 | Disposition: A | Discharge status: Home / Self Care | Payer: Medicaid Other | Source: Intra-hospital | Attending: Psychiatry | Admitting: Psychiatry

## 2021-09-09 DIAGNOSIS — F1210 Cannabis abuse, uncomplicated: Secondary | ICD-10-CM | POA: Diagnosis present

## 2021-09-09 DIAGNOSIS — Z888 Allergy status to other drugs, medicaments and biological substances status: Secondary | ICD-10-CM

## 2021-09-09 DIAGNOSIS — F200 Paranoid schizophrenia: Secondary | ICD-10-CM | POA: Diagnosis not present

## 2021-09-09 DIAGNOSIS — Z833 Family history of diabetes mellitus: Secondary | ICD-10-CM

## 2021-09-09 DIAGNOSIS — F209 Schizophrenia, unspecified: Principal | ICD-10-CM | POA: Diagnosis present

## 2021-09-09 DIAGNOSIS — Z91148 Patient's other noncompliance with medication regimen for other reason: Secondary | ICD-10-CM

## 2021-09-09 DIAGNOSIS — Z046 Encounter for general psychiatric examination, requested by authority: Secondary | ICD-10-CM | POA: Diagnosis not present

## 2021-09-09 DIAGNOSIS — F17210 Nicotine dependence, cigarettes, uncomplicated: Secondary | ICD-10-CM | POA: Diagnosis present

## 2021-09-09 DIAGNOSIS — Z885 Allergy status to narcotic agent status: Secondary | ICD-10-CM | POA: Diagnosis not present

## 2021-09-09 DIAGNOSIS — R45851 Suicidal ideations: Secondary | ICD-10-CM | POA: Diagnosis present

## 2021-09-09 DIAGNOSIS — F17200 Nicotine dependence, unspecified, uncomplicated: Secondary | ICD-10-CM | POA: Diagnosis not present

## 2021-09-09 DIAGNOSIS — Z20822 Contact with and (suspected) exposure to covid-19: Secondary | ICD-10-CM | POA: Diagnosis present

## 2021-09-09 DIAGNOSIS — Z87898 Personal history of other specified conditions: Secondary | ICD-10-CM

## 2021-09-09 DIAGNOSIS — F1290 Cannabis use, unspecified, uncomplicated: Secondary | ICD-10-CM

## 2021-09-09 DIAGNOSIS — F29 Unspecified psychosis not due to a substance or known physiological condition: Secondary | ICD-10-CM | POA: Diagnosis present

## 2021-09-09 DIAGNOSIS — F259 Schizoaffective disorder, unspecified: Secondary | ICD-10-CM | POA: Diagnosis present

## 2021-09-09 LAB — RAPID URINE DRUG SCREEN, HOSP PERFORMED
Amphetamines: NOT DETECTED
Barbiturates: NOT DETECTED
Benzodiazepines: NOT DETECTED
Cocaine: NOT DETECTED
Opiates: NOT DETECTED
Tetrahydrocannabinol: POSITIVE — AB

## 2021-09-09 LAB — RESP PANEL BY RT-PCR (FLU A&B, COVID) ARPGX2
Influenza A by PCR: NEGATIVE
Influenza B by PCR: NEGATIVE
SARS Coronavirus 2 by RT PCR: NEGATIVE

## 2021-09-09 MED ORDER — ALUM & MAG HYDROXIDE-SIMETH 200-200-20 MG/5ML PO SUSP: 30.0000 mL | ORAL | Status: DC | PRN

## 2021-09-09 MED ORDER — TRAZODONE HCL 50 MG PO TABS
50.0000 mg | ORAL_TABLET | Freq: Every evening | ORAL | Status: DC | PRN
  Administered 2021-09-13 – 2021-09-17 (×5): 50 mg via ORAL
  Filled 2021-09-09 (×5): qty 1

## 2021-09-09 MED ORDER — ACETAMINOPHEN 325 MG PO TABS: 650.0000 mg | ORAL_TABLET | ORAL | Status: DC | PRN

## 2021-09-09 MED ORDER — PALIPERIDONE ER 3 MG PO TB24
3.0000 mg | ORAL_TABLET | Freq: Every day | ORAL | Status: DC
Start: 2021-09-09 — End: 2021-09-09
  Filled 2021-09-09: qty 1

## 2021-09-09 MED ORDER — HYDROXYZINE HCL 25 MG PO TABS: 25.0000 mg | ORAL_TABLET | Freq: Three times a day (TID) | ORAL | Status: DC | PRN

## 2021-09-09 MED ORDER — MAGNESIUM HYDROXIDE 400 MG/5ML PO SUSP: 30.0000 mL | Freq: Every day | ORAL | Status: DC | PRN

## 2021-09-09 NOTE — BH Assessment (Signed)
[facility] Assessment Progress Note   Per Nira Conn, NP this patient requires psychiatric hospitalization at this time.  Pt presents under IVC initiated by pt's mother and upheld by EDP Susy Frizzle, MD.  Pt is currently under consideration for admission to [facility].  Decision is pending as of this writing.  IVC documents have been faxed to [facility].  Doylene Canning, [place] Behavioral Health Coordinator [phone]

## 2021-09-09 NOTE — ED Notes (Signed)
Pt has x1 pt belonging bag, placed in triage pt cabinet with pt ID.

## 2021-09-09 NOTE — ED Notes (Signed)
Pt is in his room asleep in the recliner chair. Pt has been calm and cooperative.

## 2021-09-09 NOTE — ED Notes (Signed)
Pt was informed of [place] [facility] Policy regarding dressing out into hospital attire, all belongings to be placed into belonging bags and labeled with pt ID. Pt acknowledged these instructions I provided. Pt had no further questions or concerns at this time.  

## 2021-09-09 NOTE — ED Notes (Signed)
Pt ambulated to restroom without assistance. Pt then walked back and began walking up and down the triage hallway. Pt then attempted to leave with Safety walking behind him. Pt was redirected away from ED lobby door and told he had to go back to his room and remain there. Pt walked back to his room and cracked the door.

## 2021-09-09 NOTE — BH Assessment (Signed)
Clinician messaged Rachel S. Smith, RN: "Hey. It's Trey with TTS. Is the pt able to engage in the assessment, if so the pt will need to be placed in a private room. Is the pt under IVC? Also is the pt medically cleared?   Clinician awaiting response.    Lauris Keepers D Boots Mc[place], MS, LCMHC, CRC Triage Specialist [phone]  

## 2021-09-09 NOTE — ED Notes (Signed)
Pt is in his room sleeping in his recliner. Pt has been calm and cooperative.

## 2021-09-09 NOTE — ED Provider Notes (Addendum)
Emergency Medicine Observation Re-evaluation Note  Shane Allen is a 36 y.o. male, seen on rounds today.  Pt initially presented to the ED for complaints of IVC Currently, the patient is sleeping.  Physical Exam  BP (!) 140/92 (BP Location: Left Arm)   Pulse 92   Temp 97.9 F (36.6 C) (Oral)   Resp 18   Ht 1.803 m (5\' 11" )   Wt 92.1 kg   SpO2 99%   BMI 28.31 kg/m  Physical Exam General: No acute distress Cardiac: Regular rate Lungs: Normal effort Psych: Deferred  ED Course / MDM  EKG:   I have reviewed the labs performed to date as well as medications administered while in observation.  Recent changes in the last 24 hours include no acute changes.  Plan  Current plan is for psychiatric assessment. [place] is under involuntary commitment.      [name], MD 09/09/21 [phone]  Patient seen by psychiatry.  Cleared for discharge.  Notified that was an error.  Continue with inpatient treatment.       [id number], MD 09/09/21 1010

## 2021-09-09 NOTE — ED Notes (Signed)
Pt is in his room asleep in the recliner. Pt has been calm and cooperative.

## 2021-09-09 NOTE — ED Notes (Signed)
Pt is in his room. Pt is calm and cooperative. Pt is sitting in chair with a blank stare.

## 2021-09-09 NOTE — BH Assessment (Deleted)
[facility] Assessment Progress Note   Per Nira Conn, NP, this voluntary pt does not require psychiatric hospitalization at this time.  Pt is psychiatrically cleared.  Discharge instructions include referral information for [facility] and for [facility].  EDP Linwood Dibbles, NP and pt's nurses, Sherrilyn Rist and Mims, have been notified.  Doylene Canning, MA Triage Specialist [phone]

## 2021-09-09 NOTE — ED Notes (Addendum)
[facility] at bedside for transport.  Deputies given paperwork for transport.
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
## 2021-09-09 NOTE — ED Notes (Signed)
Pt advises being unable to provide urine sample at this time, will monitor. 

## 2021-09-09 NOTE — ED Notes (Addendum)
Patient currently awaiting TTS.  Cart in use at this time, will be used when done.  Thurston Pounds with TTS made aware
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
## 2021-09-09 NOTE — ED Notes (Signed)
TTS at bedside. 

## 2021-09-09 NOTE — Progress Notes (Signed)
Attempted to contact the patient's mother, Shirlean Mylar. Call went to directly to voice mail. Voice mailbox full.

## 2021-09-09 NOTE — ED Notes (Signed)
Patient currently in restroom.  

## 2021-09-09 NOTE — Progress Notes (Signed)
Pt was accepted to [facility] 09/09/21; Bed Assignment 405-2 at 2200.  Pt meets inpatient criteria per Nira Conn, NP  Attending Physician will be Dr. Phineas Inches  Report can be called to:Adult unit: [phone]  Pt can arrive after 2200  Care Team notified: [facility] Rona Ravens, RN. Alan Mulder, FNP, Kiara Riddick Minor, Doylene Canning, Counselor, and Nira Conn, NP.  Kelton Pillar, LCSWA 09/09/2021 @ 7:36 PM
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
## 2021-09-10 ENCOUNTER — Encounter (HOSPITAL_COMMUNITY): Payer: Self-pay | Admitting: Psychiatry

## 2021-09-10 ENCOUNTER — Other Ambulatory Visit: Payer: Self-pay

## 2021-09-10 ENCOUNTER — Encounter (HOSPITAL_COMMUNITY): Payer: Self-pay

## 2021-09-10 DIAGNOSIS — F1290 Cannabis use, unspecified, uncomplicated: Secondary | ICD-10-CM

## 2021-09-10 DIAGNOSIS — F17200 Nicotine dependence, unspecified, uncomplicated: Secondary | ICD-10-CM

## 2021-09-10 DIAGNOSIS — F209 Schizophrenia, unspecified: Principal | ICD-10-CM

## 2021-09-10 DIAGNOSIS — Z87898 Personal history of other specified conditions: Secondary | ICD-10-CM

## 2021-09-10 LAB — URINALYSIS, COMPLETE (UACMP) WITH MICROSCOPIC
Bacteria, UA: NONE SEEN
Bilirubin Urine: NEGATIVE
Glucose, UA: NEGATIVE mg/dL
Hgb urine dipstick: NEGATIVE
Ketones, ur: NEGATIVE mg/dL
Leukocytes,Ua: NEGATIVE
Nitrite: NEGATIVE
Protein, ur: NEGATIVE mg/dL
Specific Gravity, Urine: 1.019 (ref 1.005–1.030)
pH: 6 (ref 5.0–8.0)

## 2021-09-10 LAB — CBC
HCT: 47.4 % (ref 39.0–52.0)
Hemoglobin: 16 g/dL (ref 13.0–17.0)
MCH: 33.5 pg (ref 26.0–34.0)
MCHC: 33.8 g/dL (ref 30.0–36.0)
MCV: 99.4 fL (ref 80.0–100.0)
Platelets: 265 10*3/uL (ref 150–400)
RBC: 4.77 MIL/uL (ref 4.22–5.81)
RDW: 15.5 % (ref 11.5–15.5)
WBC: 8.4 10*3/uL (ref 4.0–10.5)
nRBC: 0 % (ref 0.0–0.2)

## 2021-09-10 LAB — COMPREHENSIVE METABOLIC PANEL
ALT: 23 U/L (ref 0–44)
AST: 26 U/L (ref 15–41)
Albumin: 4.2 g/dL (ref 3.5–5.0)
Alkaline Phosphatase: 76 U/L (ref 38–126)
Anion gap: 13 (ref 5–15)
BUN: 15 mg/dL (ref 6–20)
CO2: 20 mmol/L — ABNORMAL LOW (ref 22–32)
Calcium: 9.7 mg/dL (ref 8.9–10.3)
Chloride: 108 mmol/L (ref 98–111)
Creatinine, Ser: 0.8 mg/dL (ref 0.61–1.24)
GFR, Estimated: >60 mL/min (ref >60)
Glucose, Bld: 92 mg/dL (ref 70–99)
Potassium: 3.9 mmol/L (ref 3.5–5.1)
Sodium: 141 mmol/L (ref 135–145)
Total Bilirubin: 0.8 mg/dL (ref 0.3–1.2)
Total Protein: 7.7 g/dL (ref 6.5–8.1)

## 2021-09-10 LAB — TSH: TSH: 1.544 u[IU]/mL (ref 0.350–4.500)

## 2021-09-10 LAB — LIPID PANEL
Cholesterol: 203 mg/dL — ABNORMAL HIGH (ref 0–200)
HDL: 73 mg/dL (ref >40)
LDL Cholesterol: 109 mg/dL — ABNORMAL HIGH (ref 0–99)
Total CHOL/HDL Ratio: 2.8 RATIO
Triglycerides: 107 mg/dL (ref <150)
VLDL: 21 mg/dL (ref 0–40)

## 2021-09-10 LAB — RAPID URINE DRUG SCREEN, HOSP PERFORMED
Amphetamines: NOT DETECTED
Barbiturates: NOT DETECTED
Benzodiazepines: NOT DETECTED
Cocaine: NOT DETECTED
Opiates: NOT DETECTED
Tetrahydrocannabinol: POSITIVE — AB

## 2021-09-10 LAB — HEMOGLOBIN A1C
Hgb A1c MFr Bld: 4.6 % — ABNORMAL LOW (ref 4.8–5.6)
Mean Plasma Glucose: 85.32 mg/dL

## 2021-09-10 MED ORDER — ZIPRASIDONE MESYLATE 20 MG IM SOLR: 20.0000 mg | Freq: Three times a day (TID) | INTRAMUSCULAR | Status: DC | PRN

## 2021-09-10 MED ORDER — LORAZEPAM 1 MG PO TABS
2.0000 mg | ORAL_TABLET | Freq: Four times a day (QID) | ORAL | Status: DC | PRN
  Administered 2021-09-11 – 2021-09-14 (×3): 2 mg via ORAL
  Filled 2021-09-10 (×3): qty 2

## 2021-09-10 MED ORDER — LOPERAMIDE HCL 2 MG PO CAPS: 2.0000 mg | ORAL_CAPSULE | ORAL | Status: AC | PRN

## 2021-09-10 MED ORDER — THIAMINE HCL 100 MG PO TABS
100.0000 mg | ORAL_TABLET | Freq: Every day | ORAL | Status: DC
  Administered 2021-09-13 – 2021-09-15 (×3): 100 mg via ORAL
  Filled 2021-09-10 (×10): qty 1

## 2021-09-10 MED ORDER — OLANZAPINE 5 MG PO TBDP: 5.0000 mg | ORAL_TABLET | Freq: Three times a day (TID) | ORAL | Status: DC | PRN

## 2021-09-10 MED ORDER — OLANZAPINE 10 MG PO TABS: 10.0000 mg | ORAL_TABLET | Freq: Four times a day (QID) | ORAL | Status: DC | PRN

## 2021-09-10 MED ORDER — NICOTINE 21 MG/24HR TD PT24
21.0000 mg | MEDICATED_PATCH | Freq: Every day | TRANSDERMAL | Status: DC
  Administered 2021-09-12 – 2021-09-14 (×3): 21 mg via TRANSDERMAL
  Filled 2021-09-10 (×8): qty 1

## 2021-09-10 MED ORDER — LORAZEPAM 1 MG PO TABS: 1.0000 mg | ORAL_TABLET | Freq: Three times a day (TID) | ORAL | Status: DC | PRN

## 2021-09-10 MED ORDER — THIAMINE HCL 100 MG PO TABS: 100.0000 mg | ORAL_TABLET | Freq: Every day | ORAL | Status: DC

## 2021-09-10 MED ORDER — ONDANSETRON 4 MG PO TBDP
4.0000 mg | ORAL_TABLET | Freq: Four times a day (QID) | ORAL | Status: AC | PRN
Start: 2021-09-10 — End: 2021-09-13

## 2021-09-10 MED ORDER — OLANZAPINE 10 MG IM SOLR: 10.0000 mg | Freq: Three times a day (TID) | INTRAMUSCULAR | Status: DC | PRN

## 2021-09-10 MED ORDER — LORAZEPAM 1 MG PO TABS: 1.0000 mg | ORAL_TABLET | Freq: Four times a day (QID) | ORAL | Status: AC | PRN

## 2021-09-10 MED ORDER — HYDROXYZINE HCL 25 MG PO TABS: 25.0000 mg | ORAL_TABLET | Freq: Four times a day (QID) | ORAL | Status: DC | PRN
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
## 2021-09-10 NOTE — Progress Notes (Signed)
Patient transferred to 500 hall room 507-1 at 1430.
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
## 2021-09-10 NOTE — Tx Team (Signed)
Initial Treatment Plan 09/10/2021 12:16 AM [name] ZOX:[id number]    PATIENT STRESSORS: Marital or family conflict   Medication change or noncompliance     PATIENT STRENGTHS: General fund of knowledge  Motivation for treatment/growth    PATIENT IDENTIFIED PROBLEMS: Psychosis  ETOH  "I' don't want to sign any of those papers, I'm not going to talk about it"                 DISCHARGE CRITERIA:  Improved stabilization in mood, thinking, and/or behavior Verbal commitment to aftercare and medication compliance  PRELIMINARY DISCHARGE PLAN: Attend aftercare/continuing care group Outpatient therapy  PATIENT/FAMILY INVOLVEMENT: This treatment plan has been presented to and reviewed with the patient, Shane Allen.  The patient and family have been given the opportunity to ask questions and make suggestions.  Delos Haring, RN 09/10/2021, 12:16 AM

## 2021-09-10 NOTE — Progress Notes (Signed)
   09/10/21 1500  Psych Admission Type (Psych Patients Only)  Admission Status Involuntary  Psychosocial Assessment  Patient Complaints None  Eye Contact Intense  Facial Expression Anxious  Affect Anxious;Preoccupied  Speech Pressured  Interaction Cautious;Guarded;Forwards little  Motor Activity Fidgety  Appearance/Hygiene Disheveled  Behavior Characteristics Guarded  Mood Anxious;Preoccupied  Aggressive Behavior  Effect No apparent injury  Thought Process  Coherency Blocking  Content UTA  Delusions None reported or observed  Perception Depersonalization  Hallucination Auditory  Judgment Impaired  Confusion None  Danger to Self  Current suicidal ideation? Denies  Danger to Others  Danger to Others None reported or observed

## 2021-09-10 NOTE — Group Note (Signed)
Recreation Therapy Group Note   Group Topic:Stress Management  Group Date: 09/10/2021 Start Time: 0930 End Time: 1000 Facilitators: Caroll Rancher, LRT,CTRS Location: 300 Hall Dayroom   Goal Area(s) Addresses:  Patient will actively participate in stress management techniques presented during session.  Patient will successfully identify benefit of practicing stress management post d/c.   Group Description: Guided Imagery. LRT provided education, instruction, and demonstration on practice of visualization via guided imagery. Patient was asked to participate in the technique introduced during session. LRT debriefed including topics of mindfulness, stress management and specific scenarios each patient could use these techniques. Patients were given suggestions of ways to access scripts post d/c and encouraged to explore Youtube and other apps available on smartphones, tablets, and computers.  Clinical Observations/Individualized Feedback: Unable to conduct group due to orientation group running over 20 minutes into recreation therapy group time.     Plan: Continue to engage patient in RT group sessions 2-3x/week.   Caroll Rancher, LRT,CTRS 09/10/2021 12:50 PM

## 2021-09-10 NOTE — Progress Notes (Signed)
   09/10/21 2200  Psych Admission Type (Psych Patients Only)  Admission Status Involuntary  Psychosocial Assessment  Patient Complaints None  Eye Contact Intense  Facial Expression Anxious;Angry  Affect Angry;Irritable;Preoccupied  Speech Aggressive;Argumentative  Interaction Forwards little  Motor Activity Fidgety  Appearance/Hygiene In scrubs  Behavior Characteristics Guarded  Mood Preoccupied  Aggressive Behavior  Effect No apparent injury  Thought Process  Coherency Blocking  Content Other (Comment) (refused to answer)  Delusions None reported or observed  Perception Depersonalization  Hallucination Auditory  Judgment Impaired  Confusion None  Danger to Self  Current suicidal ideation? Denies  Danger to Others  Danger to Others None reported or observed

## 2021-09-10 NOTE — Progress Notes (Signed)
Adult Psychoeducational Group Note  Date:  09/10/2021 Time:  10:06 PM  Group Topic/Focus:  Wrap-Up Group:   The focus of this group is to help patients review their daily goal of treatment and discuss progress on daily workbooks.  Participation Level:  Did Not Attend  Participation Quality:   Did Not Attend  Affect:   Did Not Attend  Cognitive:   Did Not Attend  Insight: None  Engagement in Group:   Did Not Attend  Modes of Intervention:   Did Not Attend  Additional Comments:  Pt was encouraged to attend wrap up group but did not attend.  Felipa Furnace 09/10/2021, 10:06 PM
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
## 2021-09-10 NOTE — BH IP Treatment Plan (Signed)
Interdisciplinary Treatment and Diagnostic Plan Update  09/10/2021 Time of Session: 9:50am  Shane Allen MRN: [id number]  Principal Diagnosis: Schizophrenia [facility])  Secondary Diagnoses: Principal Problem:   Schizophrenia ([place])   Current Medications:  Current Facility-Administered Medications  Medication Dose Route Frequency Provider Last Rate Last Admin   alum & mag hydroxide-simeth (MAALOX/MYLANTA) 200-200-20 MG/5ML suspension 30 mL  30 mL Oral Q4H PRN Ntuen, Kris Hartmann, FNP       hydrOXYzine (ATARAX) tablet 25 mg  25 mg Oral TID PRN Ntuen, Kris Hartmann, FNP       hydrOXYzine (ATARAX) tablet 25 mg  25 mg Oral Q6H PRN Massengill, Ovid Curd, MD       loperamide (IMODIUM) capsule 2-4 mg  2-4 mg Oral PRN Massengill, Ovid Curd, MD       OLANZapine zydis (ZYPREXA) disintegrating tablet 5 mg  5 mg Oral Q8H PRN Massengill, Ovid Curd, MD       And   LORazepam (ATIVAN) tablet 1 mg  1 mg Oral Q8H PRN Massengill, Ovid Curd, MD       And   ziprasidone (GEODON) injection 20 mg  20 mg Intramuscular Q8H PRN Massengill, Ovid Curd, MD       LORazepam (ATIVAN) tablet 1 mg  1 mg Oral Q6H PRN Massengill, Nathan, MD       magnesium hydroxide (MILK OF MAGNESIA) suspension 30 mL  30 mL Oral Daily PRN Ntuen, Kris Hartmann, FNP       ondansetron (ZOFRAN-ODT) disintegrating tablet 4 mg  4 mg Oral Q6H PRN Massengill, Nathan, MD       traZODone (DESYREL) tablet 50 mg  50 mg Oral QHS PRN Ntuen, Kris Hartmann, FNP       PTA Medications: No medications prior to admission.    Patient Stressors: Marital or family conflict   Medication change or noncompliance    Patient Strengths: Psychologist, clinical for treatment/growth   Treatment Modalities: Medication Management, Group therapy, Case management,  1 to 1 session with clinician, Psychoeducation, Recreational therapy.   Physician Treatment Plan for Primary Diagnosis: Schizophrenia ([place]) Long Term Goal(s):     Short Term Goals:    Medication Management: Evaluate  patient's response, side effects, and tolerance of medication regimen.  Therapeutic Interventions: 1 to 1 sessions, Unit Group sessions and Medication administration.  Evaluation of Outcomes: Not Met  Physician Treatment Plan for Secondary Diagnosis: Principal Problem:   Schizophrenia ([place])  Long Term Goal(s):     Short Term Goals:       Medication Management: Evaluate patient's response, side effects, and tolerance of medication regimen.  Therapeutic Interventions: 1 to 1 sessions, Unit Group sessions and Medication administration.  Evaluation of Outcomes: Not Met   RN Treatment Plan for Primary Diagnosis: Schizophrenia ([place]) Long Term Goal(s): Knowledge of disease and therapeutic regimen to maintain health will improve  Short Term Goals: Ability to remain free from injury will improve, Ability to participate in decision making will improve, Ability to verbalize feelings will improve, Ability to disclose and discuss suicidal ideas, and Ability to identify and develop effective coping behaviors will improve  Medication Management: RN will administer medications as ordered by provider, will assess and evaluate patient's response and provide education to patient for prescribed medication. RN will report any adverse and/or side effects to prescribing provider.  Therapeutic Interventions: 1 on 1 counseling sessions, Psychoeducation, Medication administration, Evaluate responses to treatment, Monitor vital signs and CBGs as ordered, Perform/monitor CIWA, COWS, AIMS and Fall Risk screenings as ordered, Perform  wound care treatments as ordered.  Evaluation of Outcomes: Not Met   LCSW Treatment Plan for Primary Diagnosis: Schizophrenia ([place]) Long Term Goal(s): Safe transition to appropriate next level of care at discharge, Engage patient in therapeutic group addressing interpersonal concerns.  Short Term Goals: Engage patient in aftercare planning with referrals and resources, Increase  social support, Increase emotional regulation, Facilitate acceptance of mental health diagnosis and concerns, Identify triggers associated with mental health/substance abuse issues, and Increase skills for wellness and recovery  Therapeutic Interventions: Assess for all discharge needs, 1 to 1 time with Social worker, Explore available resources and support systems, Assess for adequacy in community support network, Educate family and significant other(s) on suicide prevention, Complete Psychosocial Assessment, Interpersonal group therapy.  Evaluation of Outcomes: Not Met   Progress in Treatment: Attending groups: No. Participating in groups: No. Taking medication as prescribed: Yes. Toleration medication: Yes. Family/Significant other contact made: Yes, individual(s) contacted:  If consents are provided  Patient understands diagnosis: No. Discussing patient identified problems/goals with staff: Yes. Medical problems stabilized or resolved: Yes. Denies suicidal/homicidal ideation: Yes. Issues/concerns per patient self-inventory: No.   New problem(s) identified: No, Describe:  None   New Short Term/Long Term Goal(s): medication stabilization, elimination of SI thoughts, development of comprehensive mental wellness plan.   Patient Goals: "To go home"   Discharge Plan or Barriers: Patient recently admitted. CSW will continue to follow and assess for appropriate referrals and possible discharge planning.   Reason for Continuation of Hospitalization: Aggression Anxiety Delusions  Medication stabilization  Estimated Length of Stay: 3 to 7 days   Last 3 [place] Suicide Severity Risk Score: Flowsheet Row Admission (Current) from 09/09/2021 in [place] [facility] ED from 09/08/2021 in [place] DEPT ED from 02/14/2021 in [place] No Risk No Risk No Risk       Last PHQ 2/9  Scores:     No data to display          Scribe for Treatment Team: Carney Harder 09/10/2021 2:51 PM

## 2021-09-11 MED ORDER — OLANZAPINE 10 MG PO TBDP
10.0000 mg | ORAL_TABLET | Freq: Two times a day (BID) | ORAL | Status: DC
Start: 2021-09-11 — End: 2021-09-16
  Administered 2021-09-11 – 2021-09-16 (×11): 10 mg via ORAL
  Filled 2021-09-11 (×16): qty 1

## 2021-09-11 MED ORDER — OLANZAPINE 10 MG IM SOLR
10.0000 mg | Freq: Two times a day (BID) | INTRAMUSCULAR | Status: DC
  Filled 2021-09-11 (×15): qty 10

## 2021-09-11 NOTE — Progress Notes (Signed)
[facility] Second Physician Opinion Progress Note for Medication Administration to Non-consenting Patients (For Involuntarily Committed Patients)  Patient: Shane Allen Date of Birth: [birth year] MRN: [id number]  Reason for the Medication: The patient, without the benefit of the specific treatment measure, is incapable of participating in any available treatment plan that will give the patient a realistic opportunity of improving the patient's condition. There is, without the benefit of the specific treatment measure, a significant possibility that the patient will harm self or others before improvement of the patient's condition is realized.  Consideration of Side Effects: Consideration of the side effects related to the medication plan has been given.  Rationale for Medication Administration:  Consultation for forced medication protocol was requested by Dr. Caswell Corwin because the patient is not agreeing to po medications and is having psychosis and recent aggressive behaviors.  I met with this patient in his room.  On interview the patient cannot explain the reason for admission, cannot articulate an understanding of his psychiatric diagnosis, and cannot discuss risks/benefits to his medications.  He would not discuss medications at all during exam. He is irritable, disheveled, and malodorous on exam. He denies AVH, paranoia, first rank symptoms, or ideas of reference but he appears paranoid and guarded on exam. He appears to have poverty of thought and is concrete with ruminations about reason for admission.  The patient currently is too symptomatic to engage in meaningful conversations regarding the treatment of his psychiatric illness and is unlikely to improve without administration of forced medications.  He is not able to participate in a meaningful way in decisions regarding medications or treatment. At this time I am in agreement that the patient needs an antipsychotic in order to have a realistic  expectation for improving his paranoia and managing any residual psychosis/behavioral issues. It is my opinion that should the patient refuse medications that this would lead to more risk of potential self-harm than if he was on a protocol for medications against objection.  I agree with Dr. Caswell Corwin in this assessment.  Harlow Asa, MD, FAPA 09/11/21  9:31 AM   This documentation is good for (7) seven days from the date of the MD signature. New documentation must be completed every seven (7) days with detailed justification in the medical record if the patient requires continued non-emergent administration of psychotropic medications.

## 2021-09-11 NOTE — BHH Counselor (Signed)
Adult Comprehensive Assessment  Patient ID: Shane Allen, male   DOB: 02/13/1986, 36 y.o.   MRN: [id number]  Information Source: Information source: Patient  Current Stressors:  Patient states their primary concerns and needs for treatment are:: Patient states that his mom accused him of taking her phone and he was [facility] Patient states their goals for this hospitilization and ongoing recovery are:: Paitent reports that he does not know of anything he needs to work on Photographer / Learning stressors: no stressors Employment / Job issues: Patient states that he is unemployed but also is not looking to be employed Family Relationships: Patient states that he sometimes has arguments with mother but everything seems IT trainer / Lack of resources (include bankruptcy): patient reports no stressors Housing / Lack of housing: patien states that he lives comfortably with his mother Physical health (include injuries & life threatening diseases): no stressors Social relationships: no stressors Substance abuse: no stressors, denies use Bereavement / Loss: no stressors  Living/Environment/Situation:  Living Arrangements: Parent Living conditions (as described by patient or guardian): patient reports no additional information, states that he is comfortable and has no issues Who else lives in the home?: mother How long has patient lived in current situation?: 9 years What is atmosphere in current home: Comfortable  Family History:  Marital status: Single Are you sexually active?: No What is your sexual orientation?: Not assessed. Has your sexual activity been affected by drugs, alcohol, medication, or emotional stress?: N/A Does patient have children?: No  Childhood History:  By whom was/is the patient raised?: Both parents Additional childhood history information: Pt's father died in 06-24-00.  Description of patient's relationship with caregiver when they were a child: Not  assessed. Patient's description of current relationship with people who raised him/her: patient states that relationship with parents is good and he had a good childhood. How were you disciplined when you got in trouble as a child/adolescent?: Not assessed. Does patient have siblings?: Yes Number of Siblings: 5 Description of patient's current relationship with siblings: patient states that he has a good relationship with siblings and that he is the youngest sibling Did patient suffer any verbal/emotional/physical/sexual abuse as a child?: No Did patient suffer from severe childhood neglect?: No Has patient ever been sexually abused/assaulted/raped as an adolescent or adult?: No Was the patient ever a victim of a crime or a disaster?: No Witnessed domestic violence?: No Has patient been affected by domestic violence as an adult?: No  Education:  Highest grade of school patient has completed: 8th grade Currently a student?: No Learning disability?: No  Employment/Work Situation:   Employment Situation: Unemployed What is the Longest Time Patient has Held a Job?: 1 year 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?: No  Financial Resources:   Surveyor, quantity resources: Support from parents / caregiver, Actor SSI Does patient have a Lawyer or guardian?: No  Alcohol/Substance Abuse:   What has been your use of drugs/alcohol within the last 12 months?: none reported If attempted suicide, did drugs/alcohol play a role in this?: No Alcohol/Substance Abuse Treatment Hx: Denies past history If yes, describe treatment: none Has alcohol/substance abuse ever caused legal problems?: No  Social Support System:   Patient's Community Support System: Fair ("myself") Describe Community Support System: patient states he is his support for himself Type of faith/religion: Ephriam Knuckles How does patient's faith help to cope with current  illness?: reading the bible, prayer  Leisure/Recreation:  Do You Have Hobbies?: Yes Leisure and Hobbies: Pt reports, reading books, playing the guitar, hanging out places.  Strengths/Needs:   What is the patient's perception of their strengths?: easy to get a long with Patient states they can use these personal strengths during their treatment to contribute to their recovery: yes Patient states these barriers may affect/interfere with their treatment: none Patient states these barriers may affect their return to the community: none Other important information patient would like considered in planning for their treatment: none  Discharge Plan:   Currently receiving community mental health services: No Patient states concerns and preferences for aftercare planning are: none Patient states they will know when they are safe and ready for discharge when: patient believes he is ready for discharge Does patient have access to transportation?: No Does patient have financial barriers related to discharge medications?: No Patient description of barriers related to discharge medications: none Plan for no access to transportation at discharge: none Will patient be returning to same living situation after discharge?: Yes  Summary/Recommendations:   Summary and Recommendations (to be completed by the evaluator): Shane Allen is a 36 year old male who was [facility] and admitted to [facility] due to mother stating that he was becoming increasingly more violent, acting bizarrely and responding to internal stimuli.  Patient states that his mother thought that he took her phone and IVCed him.  Patient reports no current stressors.  He currently lives with his mother and has arguments every now and then but nothing more than usual.  Patient also denies any substance use.  Patient reports an 8th grade education and being unemployed.  Patient states that he is not connected to outpatient mental health follow up.While here,  [name] can benefit from crisis stabilization, medication management, therapeutic milieu, and referrals for services.   Shane Allen. 09/11/2021

## 2021-09-11 NOTE — BHH Group Notes (Signed)
Patient did not attend the Wrap-up group. 

## 2021-09-11 NOTE — Group Note (Signed)
Recreation Therapy Group Note   Group Topic:Communication  Group Date: 09/11/2021 Start Time: 1000 End Time: 1040 Facilitators: Caroll Rancher, LRT,CTRS Location: [address] Dayroom   Goal Area(s) Addresses:  Patient will effectively work with peer towards shared goal.  Patient will identify skills used to make activity successful.  Patient will identify how skills used during activity can be applied to reach post d/c goals.    Group Description: [employer]. In teams of 5-6, patients were given 12 craft pipe cleaners. Using the materials provided, patients were instructed to compete again the opposing team(s) to build the tallest free-standing structure from floor level. The activity was timed; difficulty increased by Clinical research associate as Production designer, theatre/television/film continued.  Systematically resources were removed with additional directions for example, placing one arm behind their back, working in silence, and shape stipulations. LRT facilitated post-activity discussion reviewing team processes and necessary communication skills involved in completion. Patients were encouraged to reflect how the skills utilized, or not utilized, in this activity can be incorporated to positively impact support systems post discharge.   Affect/Mood: N/A   Participation Level: Did not attend    Clinical Observations/Individualized Feedback:     Plan: Continue to engage patient in RT group sessions 2-3x/week.   Caroll Rancher, LRT,CTRS 09/11/2021 11:43 AM

## 2021-09-11 NOTE — Progress Notes (Signed)
   09/11/21 2200  Psych Admission Type (Psych Patients Only)  Admission Status Involuntary  Psychosocial Assessment  Patient Complaints None  Eye Contact Intense  Facial Expression Anxious;Angry  Affect Angry;Irritable;Preoccupied  Speech Aggressive;Argumentative  Interaction Forwards little  Motor Activity Fidgety  Appearance/Hygiene In scrubs  Behavior Characteristics Guarded;Unwilling to participate  Mood Preoccupied  Aggressive Behavior  Effect No apparent injury  Thought Process  Coherency Blocking  Content Other (Comment) (refused to answer)  Delusions None reported or observed  Perception Depersonalization  Hallucination Auditory  Judgment Impaired  Confusion None  Danger to Self  Current suicidal ideation? Denies  Danger to Others  Danger to Others None reported or observed

## 2021-09-11 NOTE — Progress Notes (Signed)
   09/11/21 0515  Sleep  Number of Hours 3.75

## 2021-09-11 NOTE — Progress Notes (Signed)
Pt was encouraged but didn't attend orientation/goals group. ?

## 2021-09-11 NOTE — Progress Notes (Signed)
Recreation Therapy Notes  INPATIENT RECREATION THERAPY ASSESSMENT  Patient Details Name: Shane Allen MRN: [id number] DOB: 1985-07-09 Today's Date: 09/11/2021       Information Obtained From: Patient  Able to Participate in Assessment/Interview: Yes  Patient Presentation: Alert  Reason for Admission (Per Patient): Other (Comments) ("I was accused of stealing my mom's farm")  Patient Stressors:  ("No")  Coping Skills:   Sports, TV, Music, Exercise, Deep Breathing, Art, Prayer, Avoidance, Read  Leisure Interests (2+):  Individual - Other (Comment) (Go out; socialize with people)  Frequency of Recreation/Participation: Weekly  Awareness of Community Resources:  Yes  Community Resources:  Library, [employer], Public affairs consultant  Current Use: Yes  If no, Barriers?:    Expressed Interest in [employer] Information: No  [employer] of Residence:  Engineer, technical sales  Patient Main Form of Transportation: Market researcher (also mom)  Patient Strengths:  "being productive, being Primary school teacher"  Patient Identified Areas of Improvement:  "no"  Patient Goal for Hospitalization:  "to get discharge"  Current SI (including self-harm):  No  Current HI:  No  Current AVH: No  Staff Intervention Plan: Collaborate with Interdisciplinary Treatment Team, Group Attendance  Consent to Intern Participation: N/A   Caroll Rancher, Richardean Sale, Bethsaida Siegenthaler A 09/11/2021, 1:47 PM

## 2021-09-11 NOTE — Progress Notes (Signed)
Pt presents with blunted affect, irritable mood, intense eye contact and pressured speech on interactions. Denies SI, HI, AVH and pain when assessed. Refused medications when offered. Started on force medications this shift. Took his medications PO when approach with show of support. Visible in dayroom for OT group and was engaged in activities. Emotional support, encouragement and reassurance provided to pt. Safety checks maintained at Q 15 minutes intervals without incident. Medications administered with verbal education and effects monitored. Pt tolerates meals, fluids and medications well. He remains guarded but safe in milieu without incident thus far.

## 2021-09-11 NOTE — Group Note (Signed)
Occupational Therapy Group Note   Group Topic:Goal Setting  Group Date: 09/11/2021 Start Time: 1415 End Time: 1515 Facilitators: Ted Mcalpine, OT   Group Description: Group encouraged engagement and participation through discussion focused on goal setting. Group members were introduced to goal-setting using the SMART Goal framework, identifying goals as Specific, Measureable, Acheivable, Relevant, and Time-Bound. Group members took time from group to create their own personal goal reflecting the SMART goal template and shared for review by peers and OT.    Therapeutic Goal(s):  Identify at least one goal that fits the SMART framework    Participation Level: Active and Engaged   Participation Quality: Independent   Behavior: Appropriate   Speech/Thought Process: Loose association    Affect/Mood: Flat   Insight: Fair   Judgement: Fair   Individualization: pt was engaged in their participation of group discussion/activity. New skills were identified  Modes of Intervention: Discussion and Education  Patient Response to Interventions:  Attentive and Engaged   Plan: Continue to engage patient in OT groups 2 - 3x/week.  09/11/2021  Ted Mcalpine, OT  Kerrin Champagne, OT
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
## 2021-09-12 NOTE — [facility] Group Notes (Signed)
Spirituality group facilitated by Kathleen Argue, BCC.   Group Description: Group focused on topic of hope. Patients participated in facilitated discussion around topic, connecting with one another around experiences and definitions for hope. Group members engaged with visual explorer photos, reflecting on what hope looks like for them today. Group engaged in discussion around how their definitions of hope are present today in hospital.   Modalities: Psycho-social ed, Adlerian, Narrative, MI   Patient Progress: Shane Allen attended group and participated in the group conversation.  His verbal comments were limited, but he showed some engagement.  Chaplain Dyanne Carrel, Bcc PAger, [phone]

## 2021-09-12 NOTE — Progress Notes (Signed)
DAR NOTE: Patient presents with anxious affect and mood.  Denies suicidal thoughts, pain, auditory and visual hallucinations.  Rates depression at 0, hopelessness at 0, and anxiety at 0.  Maintained on routine safety checks.  Medications given as prescribed.  Support and encouragement offered as needed.  Attended group and participated.  States goal for today is "discharge."  Patient observed socializing with peers in the dayroom.  Offered no complaint.

## 2021-09-12 NOTE — Group Note (Signed)
Type of Therapy and Topic: Group Therapy: Control  Participation Level: Active  Description of Group: In this group patients will discuss what is out of their control, what is somewhat in their control, and what is within their control.  They will be encouraged to explore what issues they can control and what issues are out of their control within their daily lives. They will be guided to discuss their thoughts, feelings, and behaviors related to these issues. The group will process together ways to better control things that are well within our own control and how to notice and accept the things that are not within our control. This group will be process-oriented, with patients participating in exploration of their own experiences as well as giving and receiving support and challenge from other group members.  During this group 2 worksheets will be provided to each patient to follow along and fill out.   Therapeutic Goals: 1. Patient will identify what is within their control and what is not within their control. 2. Patient will identify their thoughts and feelings about having control over their own lives. 3. Patient will identify their thoughts and feelings about not having control over everything in their lives.. 4. Patient will identify ways that they can have more control over their own lives. 5. Patient will identify areas were they can allow others to help them or provide assistance.  Summary of Patient Progress: Patient participated appropriately in group and had good insight into group topic.  Patient participated in worksheets and shared that keeping a clear mind helps with his anxiety.  Patient offered and received feedback from group member peers.     Raquan Iannone, LCSW, LCAS Clincal Social Worker  [facility]

## 2021-09-12 NOTE — Progress Notes (Signed)
   09/12/21 0515  Sleep  Number of Hours 7    

## 2021-09-12 NOTE — Progress Notes (Signed)
   09/12/21 2040  Psych Admission Type (Psych Patients Only)  Admission Status Involuntary  Psychosocial Assessment  Patient Complaints None  Eye Contact Brief  Facial Expression Flat  Affect Appropriate to circumstance;Blunted  Speech Logical/coherent  Interaction Minimal  Motor Activity Slow  Appearance/Hygiene Improved  Behavior Characteristics Cooperative;Appropriate to situation  Mood Preoccupied  Thought Process  Coherency Blocking  Content Other (Comment) (minimal to no conversation)  Delusions None reported or observed  Perception WDL  Hallucination None reported or observed  Judgment Impaired  Confusion None  Danger to Self  Current suicidal ideation? Denies

## 2021-09-12 NOTE — [facility] Group Notes (Signed)
[facility] Group Notes:  (Nursing/MHT/Case Management/Adjunct)  Date:  09/12/2021  Time:  9:51 AM  Type of Therapy:   Orientation/Goals group  Participation Level:  Active  Participation Quality:  Appropriate  Affect:  Appropriate  Cognitive:  Appropriate  Insight:  Appropriate  Engagement in Group:  Engaged and Improving  Modes of Intervention:  Discussion, Education, Orientation, and Support  Summary of Progress/Problems: Pt goal for today is to have a good day and find out when his discharge date is.   Shane Allen J Osa Campoli 09/12/2021, 9:51 AM
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
## 2021-09-12 NOTE — Group Note (Signed)
Recreation Therapy Group Note   Group Topic:Self-Esteem  Group Date: 09/12/2021 Start Time: 1011 End Time: 1055 Facilitators: Caroll Rancher, LRT,CTRS Location: [address] Dayroom   Goal Area(s) Addresses:  Patient will appropriately identify what self esteem is.  Patient will create a shield of armor describing themselves.  Patient will successfully identify positive attributes about themselves.  Patient will acknowledge benefit of improved self-esteem.    Activity Description:  Self-Esteem Shield. Patient attended a recreation therapy group session focused on self esteem. Patient identified what self esteem is, and why it is important to have high self esteem during group discussion. LRT wrote on the white board, drawing the outline of the shield and labeling the quadrants.  Patient was asked to create their own shield to show off their unique attributes, four quadrants reflected the following:  The Upper Left quadrant- two things or persons they admire most The Upper Right quadrant- two lessons they've learned thus far in life The Lower Left quadrant- at least three qualities that make them unique The Lower Right quadrant- two goals they are working towards   Patients were provided sheets with the shield printed on them and colored pencils, markers and crayons to complete the activity.  Patients were debriefed on the importance of healthy self esteem.   Affect/Mood: Appropriate   Participation Level: Active   Participation Quality: Independent   Behavior: Appropriate   Speech/Thought Process: Disorganized   Insight: Fair   Judgement: Fair    Modes of Intervention: Art   Patient Response to Interventions:  Engaged   Education Outcome:  Acknowledges education and In group clarification offered    Clinical Observations/Individualized Feedback: Pt was appropriate during group session.  Pt did seem to be in his own world at times.  Pt response to the activity were  disorganized.  In the first quadrant, pt expressed what he admired was "take advise from some people and maintain what normal people do".  In quadrant two, identified "maintain a good day and cope" as lessons lessons he has learned.  The things pt identified that make him unique were "what's normal" and a goal pt is working towards is "landscape".    Plan: Continue to engage patient in RT group sessions 2-3x/week.   Caroll Rancher, LRT,CTRS 09/12/2021 1:08 PM

## 2021-09-12 NOTE — Progress Notes (Signed)
Adult Psychoeducational Group Note  Date:  09/12/2021 Time:  8:50 PM  Group Topic/Focus:  Wrap-Up Group:   The focus of this group is to help patients review their daily goal of treatment and discuss progress on daily workbooks.  Participation Level:  Did Not Attend  Participation Quality:   Did Not Attend  Affect:   Did Not Attend  Cognitive:   Did Not Attend  Insight: None  Engagement in Group:   Did not Attend  Modes of Intervention:   Did Not Attend  Additional Comments:  Pt was encouraged to attend wrap up group but did not attend.  Felipa Furnace 09/12/2021, 8:50 PM

## 2021-09-13 NOTE — Progress Notes (Signed)
Adult Psychoeducational Group Note  Date:  09/13/2021 Time:  8:27 PM  Group Topic/Focus:  Wrap-Up Group:   The focus of this group is to help patients review their daily goal of treatment and discuss progress on daily workbooks.  Participation Level:  Active  Participation Quality:  Appropriate and Attentive  Affect:  Appropriate  Cognitive:  Alert  Insight: Appropriate  Engagement in Group:  Engaged  Modes of Intervention:  Discussion  Additional Comments:   Pt was engaged during wrap up group. Pt states that he had a good day and enjoyed meeting everyone and getting a better opportunity to socialize. Pt states that he enjoyed his group today and hopes that tomorrow is just as productive as today was. One of his goals post D/C was to find work.  Vevelyn Pat 09/13/2021, 8:27 PM

## 2021-09-13 NOTE — Progress Notes (Addendum)
Pt is A&OX4, bizarre, labile, isolative at times, denies suicidal ideations, denies homicidal ideations, denies auditory hallucinations and denies visual hallucinations. However, there are moments when it appears Pt is trying to prevent staff from noticing internal activity/stimulation. Pt verbally agrees to approach staff if these become apparent and before harming self or others. Pt denies experiencing nightmares. Mood and affect are congruent. Pt appetite is ok. No complaints of anxiety, distress, pain and/or discomfort at this time. Pt's memory appears to be grossly intact, and Pt hasn't displayed any injurious behaviors. Pt is medication compliant. There's no evidence of suicidal intent. Psychomotor activity was WNL. No s/s of Parkinson, Dystonia, Akathisia and/or Tardive Dyskinesia noted.  Pt continuously asked for ginger ale soda. Writer allowed pt to drink four regular mini ginger ales, the remaining mini ginger ales for today were diet. Writer educated pt on the importance of monitoring his caffeine and glucose/sugar intake. Pt verbalized comprehension and continued to ask for ginger ale.

## 2021-09-13 NOTE — [facility] Group Notes (Signed)
[facility] Group Notes:  (Social Work)  Date:  09/13/2021   Time:  11:00am - 12:00pm  Type of Therapy:  Group Therapy  Participation Level:  Minimal  Participation Quality:  Drowsy and Resistant  Affect:  Defensive and Irritable  Cognitive:  Disorganized  Insight:  Limited  Engagement in Group:  Off Topic  Modes of Intervention:   Mindfulness, Cognitive Behavioral Therapy, Music Activity  Summary of Progress/Problems: Summary of Progress/Problems: Before group member introductions, PMHNP student facilitator lead a mindfulness activity using the 5 Finger Breathing technique.  Patient declined to participate in mindfulness activity.   Today's group topic was on rejection and how patients handle the feelings and emotions associated with being rejected in life.  Patient was disorganized and had difficulty describing his thoughts and feelings.  He was irritable at times and stated that being rejected "is not something I deal with."  Pt took the printed lyrics of the song PMHNP student facilitator gave to pt and immediately threw the lyric sheet in the trash.    Garvin Fila, PMHNP Student 09/13/2021, 3:54 PM

## 2021-09-13 NOTE — Progress Notes (Signed)
   09/13/21 2153  Psych Admission Type (Psych Patients Only)  Admission Status Involuntary  Psychosocial Assessment  Patient Complaints None  Eye Contact Brief  Facial Expression Flat  Affect Blunted;Anxious  Speech Logical/coherent  Interaction Minimal  Motor Activity Pacing;Fidgety  Appearance/Hygiene Improved  Behavior Characteristics Guarded  Mood Anxious;Preoccupied;Pleasant  Thought Process  Coherency Disorganized;Tangential  Content Other (Comment) (minimal to no conversation)  Delusions None reported or observed  Perception WDL  Hallucination None reported or observed  Judgment Impaired  Confusion None  Danger to Self  Current suicidal ideation? Denies
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
## 2021-09-14 DIAGNOSIS — F200 Paranoid schizophrenia: Secondary | ICD-10-CM

## 2021-09-14 MED ORDER — NICOTINE POLACRILEX 2 MG MT GUM
2.0000 mg | CHEWING_GUM | OROMUCOSAL | Status: DC | PRN
Start: 2021-09-14 — End: 2021-09-18
  Administered 2021-09-14 – 2021-09-18 (×11): 2 mg via ORAL
  Filled 2021-09-14 (×7): qty 1

## 2021-09-14 NOTE — Progress Notes (Signed)
Patient is agitated and anxious.Pacing, picking up the telephone, pressing numbers really fast then hanging up. Patient was given PRN Lorazepam 2mg . Will continue to monitor patient.
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
## 2021-09-14 NOTE — Group Note (Signed)
[facility] LCSW Group Therapy Note  Date/Time:  09/14/2021    Type of Therapy and Topic:  Group Therapy: Music and Mood  Participation Level:  Minimal   Description of Group: In this process group, members listened to a variety of music through choosing from CSW's list #1 through #25.  Patients identified the messages received from those songs and how the music affected their emotions.  Patients were encouraged to use music as a coping skill at home, but to be mindful of the choices made.  Patients discussed how this knowledge can help with wellness and recovery in various ways including managing depression and anxiety as well as encouraging healthy sleep habits.    Therapeutic Goals: Patients will explore the impact of different songs on mood Patients will verbalize the thoughts they have when listening to different types of music Patients will identify music that is soothing to them as well as music that is energizing to them Patients will discuss how to use this knowledge to assist in maintaining wellness and recovery Patients will explore the use of music as a coping skill Patients will encourage one another  Summary of Patient Progress:  At the beginning of group, patient was not present but he did come in for the last 3-4 songs.  Patient was attentive and at the end of group stated that he now felt "motivated."   Therapeutic Modalities: Solution Focused Brief Therapy Activity   Ambrose Mantle, LCSW

## 2021-09-14 NOTE — Progress Notes (Signed)
Adult Psychoeducational Group Note  Date:  09/14/2021 Time:  9:00 PM  Group Topic/Focus:  Wrap-Up Group:   The focus of this group is to help patients review their daily goal of treatment and discuss progress on daily workbooks.  Participation Level:  Active  Participation Quality:  Inattentive  Affect:  Flat and Not Congruent  Cognitive:  Alert and Lacking  Insight: Lacking and Limited  Engagement in Group:  Lacking and Limited  Modes of Intervention:  Discussion  Additional Comments:   Pt was uninterested in group today. Pt states he had a good day because he "went through his day". Whenever asked other questions about his day pt would continue to repeat this response. When asked about his self care habits pt stated "just to have more experiences that are more fun than tomorrow". After re-explaining what self care meant, pt reiterated that same response   Vevelyn Pat 09/14/2021, 9:00 PM

## 2021-09-14 NOTE — Progress Notes (Signed)
   09/14/21 0800  Psych Admission Type (Psych Patients Only)  Admission Status Involuntary  Psychosocial Assessment  Patient Complaints None  Eye Contact Brief  Facial Expression Flat  Affect Blunted  Speech Logical/coherent  Interaction Guarded;Minimal  Motor Activity Fidgety  Appearance/Hygiene Improved  Behavior Characteristics Cooperative;Guarded  Mood Preoccupied;Pleasant  Thought Process  Coherency Disorganized  Content Preoccupation  Delusions None reported or observed  Perception WDL  Hallucination None reported or observed  Judgment Impaired  Confusion None  Danger to Self  Current suicidal ideation? Denies  Agreement Not to Harm Self Yes  Description of Agreement Verbal  Danger to Others  Danger to Others None reported or observed

## 2021-09-15 ENCOUNTER — Encounter (HOSPITAL_COMMUNITY): Payer: Self-pay

## 2021-09-15 NOTE — Progress Notes (Signed)
   09/14/21 2015  Psych Admission Type (Psych Patients Only)  Admission Status Involuntary  Psychosocial Assessment  Patient Complaints None  Eye Contact Brief  Facial Expression Flat  Affect Blunted;Anxious  Speech Logical/coherent  Interaction Minimal  Motor Activity Pacing;Fidgety  Appearance/Hygiene Improved  Behavior Characteristics Appropriate to situation  Mood Pleasant;Preoccupied  Thought Process  Coherency Disorganized;Tangential  Content Other (Comment) (minimal to no conversation)  Delusions None reported or observed  Perception WDL  Hallucination None reported or observed  Judgment Impaired  Confusion None  Danger to Self  Current suicidal ideation? Denies
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
## 2021-09-15 NOTE — Group Note (Signed)
[facility] LCSW Group Therapy Note    Group Date: 09/15/2021 Start Time: 1300 End Time: 1400  Type of Therapy and Topic:  Group Therapy:  Overcoming Obstacles  Participation Level:  [facility] PARTICIPATION LEVEL: Active    Description of Group:   In this group patients will be encouraged to explore what they see as obstacles to their own wellness and recovery. They will be guided to discuss their thoughts, feelings, and behaviors related to these obstacles. The group will process together ways to cope with barriers, with attention given to specific choices patients can make. Each patient will be challenged to identify changes they are motivated to make in order to overcome their obstacles. This group will be process-oriented, with patients participating in exploration of their own experiences as well as giving and receiving support and challenge from other group members.  Therapeutic Goals: 1. Patient will identify personal and current obstacles as they relate to admission. 2. Patient will identify barriers that currently interfere with their wellness or overcoming obstacles.  3. Patient will identify feelings, thought process and behaviors related to these barriers. 4. Patient will identify two changes they are willing to make to overcome these obstacles:    Summary of Patient Progress   Patient participated appropriately in group.  Patient had good insight into group topic and discussed strengths and weaknesses to solving problems with alcohol.  Patient participated in problem solving worksheet and was open and provided feedback to peers.    Therapeutic Modalities:   Cognitive Behavioral Therapy Solution Focused Therapy Motivational Interviewing Relapse Prevention Therapy   Fabianna Keats E Scarlettrose Costilow, LCSW

## 2021-09-15 NOTE — Progress Notes (Signed)
   09/15/21 2100  Psych Admission Type (Psych Patients Only)  Admission Status Involuntary  Psychosocial Assessment  Patient Complaints None  Eye Contact Brief  Facial Expression Flat  Affect Anxious;Preoccupied  Speech Logical/coherent  Interaction Cautious;Guarded;Minimal  Motor Activity Pacing;Fidgety  Appearance/Hygiene Disheveled  Behavior Characteristics Appropriate to situation  Mood Preoccupied;Suspicious  Aggressive Behavior  Effect No apparent injury  Thought Process  Coherency Circumstantial;Tangential;Disorganized  Content Blaming others  Delusions None reported or observed  Perception WDL  Hallucination None reported or observed  Judgment Impaired  Confusion None  Danger to Self  Current suicidal ideation? Denies  Danger to Others  Danger to Others None reported or observed    

## 2021-09-15 NOTE — Progress Notes (Signed)
DAR NOTE: Patient presents with anxious affect and depressed mood.  Denies suicidal thoughts, pain, auditory and visual hallucinations.  Rates depression at 0, hopelessness at 0, and anxiety at 0.  Maintained on routine safety checks.  Medications given as prescribed.  Support and encouragement offered as needed.  Attended group and participated.  States goal for today is "maintain a good day."  Patient visible in milieu with minimal interaction.  Offered no complaint.
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
## 2021-09-15 NOTE — Progress Notes (Signed)
The focus of this group is to help patients review their daily goal of treatment and discuss progress on daily workbooks.  Pt attended the evening group but left repeatedly throughout. The Writer did later ask the Pt about his day, to which he responded that it was good. "My Mom came to see me, which was nice."  When asked about his short-term goals, Pt said his only goal this week was to get a job.  Pt rated his day a 10 out of 10 and his affect was flat.

## 2021-09-16 MED ORDER — OLANZAPINE 5 MG PO TBDP
5.0000 mg | ORAL_TABLET | Freq: Every day | ORAL | Status: DC
  Filled 2021-09-16: qty 1

## 2021-09-16 MED ORDER — OLANZAPINE 5 MG PO TBDP
5.0000 mg | ORAL_TABLET | Freq: Every day | ORAL | Status: DC
  Administered 2021-09-17: 5 mg via ORAL
  Filled 2021-09-16 (×2): qty 1

## 2021-09-16 MED ORDER — OLANZAPINE 15 MG PO TBDP
15.0000 mg | ORAL_TABLET | Freq: Every day | ORAL | Status: DC
Start: 2021-09-16 — End: 2021-09-18
  Administered 2021-09-16 – 2021-09-17 (×2): 15 mg via ORAL
  Filled 2021-09-16 (×4): qty 1
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
## 2021-09-16 NOTE — Progress Notes (Signed)
   09/15/21 2100  Psych Admission Type (Psych Patients Only)  Admission Status Involuntary  Psychosocial Assessment  Patient Complaints None  Eye Contact Brief  Facial Expression Flat  Affect Anxious;Preoccupied  Speech Logical/coherent  Interaction Cautious;Guarded;Minimal  Motor Activity Pacing;Fidgety  Appearance/Hygiene Disheveled  Behavior Characteristics Appropriate to situation  Mood Preoccupied;Suspicious  Aggressive Behavior  Effect No apparent injury  Thought Process  Coherency Circumstantial;Tangential;Disorganized  Content Blaming others  Delusions None reported or observed  Perception WDL  Hallucination None reported or observed  Judgment Impaired  Confusion None  Danger to Self  Current suicidal ideation? Denies  Danger to Others  Danger to Others None reported or observed

## 2021-09-16 NOTE — Progress Notes (Signed)
   09/16/21 2145  Psych Admission Type (Psych Patients Only)  Admission Status Involuntary  Psychosocial Assessment  Patient Complaints None  Eye Contact Brief  Facial Expression Flat  Affect Anxious;Preoccupied  Speech Logical/coherent  Interaction Cautious;Guarded;Minimal  Motor Activity Pacing;Fidgety  Appearance/Hygiene Disheveled  Behavior Characteristics Appropriate to situation  Mood Suspicious;Preoccupied  Aggressive Behavior  Effect No apparent injury  Thought Process  Coherency Circumstantial;Tangential;Disorganized  Content Blaming others  Delusions None reported or observed  Perception WDL  Hallucination None reported or observed  Judgment Impaired  Confusion None  Danger to Self  Current suicidal ideation? Denies  Danger to Others  Danger to Others None reported or observed

## 2021-09-16 NOTE — Progress Notes (Signed)
Adult Psychoeducational Group Note  Date:  09/16/2021 Time:  8:59 PM  Group Topic/Focus:  Wrap-Up Group:   The focus of this group is to help patients review their daily goal of treatment and discuss progress on daily workbooks.  Participation Level:  Did Not Attend  Participation Quality:   Did Not Attend  Affect:   Did Not Attend  Cognitive:   Did Not Attend  Insight: None  Engagement in Group:   Did Not Attend  Modes of Intervention:   Did Not Attend  Additional Comments:  Pt was encouraged to attend wrap up group but did not attend  Shane Allen 09/16/2021, 8:59 PM

## 2021-09-16 NOTE — Progress Notes (Signed)
   09/16/21 0530  Sleep  Number of Hours 7.25

## 2021-09-16 NOTE — Group Note (Signed)
Recreation Therapy Group Note   Group Topic:Stress Management  Group Date: 09/16/2021 Start Time: 1000 End Time: 1055 Facilitators: Caroll Rancher, LRT,CTRS Location: [address] Dayroom   Goal Area(s) Addresses:  Patient will identify positive stress management techniques. Patient will identify benefits of using stress management post d/c.  Group Description:  Stress Exploration.  LRT and patients discussed the causes of stress and how to deal with them.  Patients gave examples of things that can cause a person to stress out such as work, bills, family, etc.  Patients were then given a worksheet that was divided into three areas daily hassles, major life changes and life circumstances.  Patients were to identify four stresses for each section.  On the back of the sheet, patients were to identify things that can help them deal with the stressor in each section listed above   Affect/Mood: N/A   Participation Level: Did not attend    Clinical Observations/Individualized Feedback:     Plan: Continue to engage patient in RT group sessions 2-3x/week.   Caroll Rancher, LRT,CTRS 09/16/2021 1:01 PM

## 2021-09-17 NOTE — Progress Notes (Signed)
   09/17/21 1300  Psych Admission Type (Psych Patients Only)  Admission Status Involuntary  Psychosocial Assessment  Patient Complaints None  Eye Contact Brief  Facial Expression Flat  Affect Apathetic;Preoccupied  Speech Logical/coherent  Interaction Cautious;Guarded;Minimal  Motor Activity Pacing;Fidgety  Appearance/Hygiene Disheveled  Behavior Characteristics Appropriate to situation  Mood Preoccupied;Suspicious  Aggressive Behavior  Effect No apparent injury  Thought Process  Coherency Circumstantial;Tangential;Disorganized  Content Blaming others  Delusions None reported or observed  Perception WDL  Hallucination None reported or observed  Judgment Impaired  Confusion None  Danger to Self  Current suicidal ideation? Denies  Danger to Others  Danger to Others None reported or observed

## 2021-09-17 NOTE — [facility] Suicide Risk Assessment (Signed)
[facility] INPATIENT:  Family/Significant Other Suicide Prevention Education  Suicide Prevention Education:  Education Completed; Shirlean Mylar,  [phone] (name of family member/significant other) has been identified by the patient as the family member/significant other with whom the patient will be residing, and identified as the person(s) who will aid the patient in the event of a mental health crisis (suicidal ideations/suicide attempt).  With written consent from the patient, the family member/significant other has been provided the following suicide prevention education, prior to the and/or following the discharge of the patient.  Patient had been paranoid about taking care of himself and taking medications and then he started hurting himself.  Off medications and 1 year and 9 months and started losing reality in December 2021. Patient stopped started taking medication again and seemed to get better but then stopped and got worse.  Mother has been monitoring to make sure that he can take medications.  Mother is worried about patient not taking medication and doing everything he needs to to get out of the hospital without being honest.  No guns/weapons.  Mother reports that he has destroyed his room and feels like he needs his own place.  CSW provided resources for case managers that are more appropriate to connect with independent living and housing.   The suicide prevention education provided includes the following: Suicide risk factors Suicide prevention and interventions National Suicide Hotline telephone number [facility] assessment telephone number [facility] Emergency Assistance 911 [facility] and/or Residential Mobile Crisis Unit telephone number  Request made of family/significant other to: Remove weapons (e.g., guns, rifles, knives), all items previously/currently identified as safety concern.   Remove drugs/medications (over-the-counter, prescriptions, illicit  drugs), all items previously/currently identified as a safety concern.  The family member/significant other verbalizes understanding of the suicide prevention education information provided.  The family member/significant other agrees to remove the items of safety concern listed above.  Abrham Maslowski E Sapphira Harjo 09/17/2021, 2:23 PM

## 2021-09-17 NOTE — Group Note (Signed)
Topic: Self Esteem  Due to the acuity and complex discharge plans, group was not held. Patient was provided therapeutic worksheets and asked to meet with CSW as needed.    Yohann Curl, LCSW, LCAS Clincal Social Worker  [facility]

## 2021-09-17 NOTE — Group Note (Signed)
Recreation Therapy Group Note   Group Topic:Healthy Decision Making  Group Date: 09/17/2021 Start Time: 1000 End Time: 1050 Facilitators: Caroll Rancher, LRT,CTRS Location: 500 [address] Dayroom   Goal Area(s) Addresses:  Patient will effectively work with peer towards shared goal.  Patient will identify factors that guided their decision making.  Patient will pro-socially communicate ideas during group session.    Group Description:  Patients were given a scenario that they were going to be stranded on a deserted [place] for several months before being rescued. Writer tasked them with making a list of 15 things they would choose to bring with them for "survival". The list of items was prioritized most important to least. Each patient would come up with their own list, then work together to create a new list of 15 items while in a group of 3-5 peers. LRT discussed each person's list and how it differed from others. The debrief included discussion of priorities, good decisions versus bad decisions, and how it is important to think before acting so we can make the best decision possible. LRT tied the concept of effective communication among group members to patient's support systems outside of the hospital and its benefit post discharge.   Affect/Mood: N/A   Participation Level: Did not attend    Clinical Observations/Individualized Feedback:     Plan: Continue to engage patient in RT group sessions 2-3x/week.   Caroll Rancher, LRT,CTRS 09/17/2021 1:46 PM

## 2021-09-17 NOTE — Progress Notes (Signed)
Psychoeducational Group Note  Date:  09/17/2021 Time:  2000  Group Topic/Focus:  Wrap up group  Participation Level: Did Not Attend  Participation Quality:  Not Applicable  Affect:  Not Applicable  Cognitive:  Not Applicable  Insight:  Not Applicable  Engagement in Group: Not Applicable  Additional Comments:  Did not attend.   Marcille Buffy 09/17/2021, 8:52 PM

## 2021-09-17 NOTE — Progress Notes (Signed)
   09/17/21 0615  Sleep  Number of Hours 8.25

## 2021-09-17 NOTE — Progress Notes (Signed)
   09/17/21 2300  Psych Admission Type (Psych Patients Only)  Admission Status Involuntary  Psychosocial Assessment  Patient Complaints Suspiciousness;None  Eye Contact Brief  Facial Expression Flat  Affect Anxious;Preoccupied  Speech Logical/coherent  Interaction Cautious;Guarded;Minimal  Motor Activity Pacing;Fidgety  Appearance/Hygiene Disheveled  Behavior Characteristics Cooperative  Mood Suspicious;Preoccupied  Aggressive Behavior  Effect No apparent injury  Thought Process  Coherency Circumstantial;Tangential;Disorganized  Content Blaming others  Delusions None reported or observed  Perception WDL  Hallucination None reported or observed  Judgment Impaired  Confusion None  Danger to Self  Current suicidal ideation? Denies  Danger to Others  Danger to Others None reported or observed
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
## 2021-09-18 MED ORDER — NICOTINE POLACRILEX 2 MG MT GUM
2.0000 mg | CHEWING_GUM | OROMUCOSAL | 0 refills | Status: AC | PRN
Start: 2021-09-18 — End: 2021-10-18

## 2021-09-18 MED ORDER — OLANZAPINE 15 MG PO TABS
15.0000 mg | ORAL_TABLET | Freq: Every day | ORAL | 0 refills | Status: DC
Start: 2021-09-19 — End: 2022-11-27

## 2021-09-18 MED ORDER — OLANZAPINE 7.5 MG PO TABS
15.0000 mg | ORAL_TABLET | Freq: Every day | ORAL | Status: DC
  Filled 2021-09-18: qty 2

## 2021-09-18 MED ORDER — OLANZAPINE 5 MG PO TABS
5.0000 mg | ORAL_TABLET | Freq: Every day | ORAL | 0 refills | Status: DC
Start: 2021-09-19 — End: 2022-11-27

## 2021-09-18 MED ORDER — TRAZODONE HCL 50 MG PO TABS
50.0000 mg | ORAL_TABLET | Freq: Every evening | ORAL | 0 refills | Status: DC | PRN
Start: 2021-09-18 — End: 2021-11-07

## 2021-09-18 MED ORDER — OLANZAPINE 5 MG PO TABS
5.0000 mg | ORAL_TABLET | Freq: Every day | ORAL | Status: DC
Start: 2021-09-19 — End: 2021-09-18
  Filled 2021-09-18 (×2): qty 1

## 2021-09-18 NOTE — Progress Notes (Signed)
   09/18/21 0515  Sleep  Number of Hours 6
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
## 2021-09-18 NOTE — Plan of Care (Signed)
Patient attended one  recreation therapy group session.   Caroll Rancher, LRT,CTRS

## 2021-09-18 NOTE — BHH Group Notes (Signed)
Patient did not attend morning orientation group.  

## 2021-09-18 NOTE — Progress Notes (Signed)
Recreation Therapy Notes  INPATIENT RECREATION TR PLAN  Patient Details Name: Shane Allen MRN: [id number] DOB: November 26, 1985 Today's Date: 09/18/2021  Rec Therapy Plan Is patient appropriate for Therapeutic Recreation?: Yes Treatment times per week: about 3 days Estimated Length of Stay: 5-7 days TR Treatment/Interventions: Group participation (Comment)  Discharge Criteria Pt will be discharged from therapy if:: Discharged Treatment plan/goals/alternatives discussed and agreed upon by:: Patient/family  Discharge Summary Short term goals set: See patient care plan Short term goals met: Not met Progress toward goals comments: Groups attended Which groups?: Self-esteem Reason goals not met: Pt attended one group. Therapeutic equipment acquired: N/Allen Reason patient discharged from therapy: Discharge from hospital Pt/family agrees with progress & goals achieved: Yes Date patient discharged from therapy: 09/18/21   Shane Allen, Shane Allen, Shane Allen 09/18/2021, 12:23 PM

## 2021-09-18 NOTE — Progress Notes (Signed)
  [facility] Adult Case Management Discharge Plan :  Will you be returning to the same living situation after discharge:  Yes,  home At discharge, do you have transportation home?: Yes,  mother will pick patient up Do you have the ability to pay for your medications: Yes,  insurance  Release of information consent forms completed and in the chart;  Patient's signature needed at discharge.  Patient to Follow up at:   Follow-up Information     [facility] Follow up on 09/25/2021.   Why: You have a hospital follow up appointment to continue care with this provider for therapy and medication management services on 09/25/21 at 9:30 am.  This will be a Virtual telehealth appointment. Contact information: [address]  [address] [place] [address] [phone]                   Next level of care provider has access to [facility] Link:no  Safety Planning and Suicide Prevention discussed: Yes,  Mother, Shirlean Mylar     Has patient been referred to the Quitline?: Patient refused referral  Patient has been referred for addiction treatment: Yes, patient can get substance use counseling at [facility] Tyashia Morrisette, LCSW 09/18/2021, 10:13 AM
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
## 2021-09-18 NOTE — BHH Group Notes (Signed)
Patient did not attend Mental Health Peer Support group  

## 2021-09-18 NOTE — Group Note (Signed)
Recreation Therapy Group Note   Group Topic:Team Building  Group Date: 09/18/2021 Start Time: 1000 End Time: 1045 Facilitators: Caroll Rancher, LRT,CTRS Location: [address] Dayroom   Goal Area(s) Addresses:  Patient will effectively work with peer towards shared goal.  Patient will identify skills used to make activity successful.  Patient will share challenges and verbalize solution-driven approaches used. Patient will identify how skills used during activity can be used to reach post d/c goals.    Group Description: [employer]. Patients were provided the following materials: 5 drinking straws, 5 rubber bands, 5 paper clips, 2 index cards, 2 drinking cups, and 2 toilet paper rolls. Using the provided materials patients were asked to build a launching mechanism to launch a ping pong ball across the room, approximately 10 feet. Patients were divided into teams of 3-5. Instructions required all materials be incorporated into the device, functionality of items left to the peer group's discretion.   Affect/Mood: N/A   Participation Level: Did not attend    Clinical Observations/Individualized Feedback:     Plan: Continue to engage patient in RT group sessions 2-3x/week.   Caroll Rancher, LRT,CTRS 09/18/2021 12:04 PM

## 2021-09-18 NOTE — Discharge Instructions (Signed)
-  Follow-up with your outpatient psychiatric provider -instructions on appointment date, time, and address (location) are provided to you in discharge paperwork.  -Take your psychiatric medications as prescribed at discharge - instructions are provided to you in the discharge paperwork  -Follow-up with outpatient primary care doctor and other specialists -for management of preventative medicine and any chronic medical disease.  -Recommend abstinence from alcohol, tobacco, and other illicit drug use at discharge.   -If your psychiatric symptoms recur, worsen, or if you have side effects to your psychiatric medications, call your outpatient psychiatric provider, 911, 988 or go to the nearest emergency department.  -If suicidal thoughts recur, call your outpatient psychiatric provider, 911, 988 or go to the nearest emergency department.  

## 2021-09-18 NOTE — Progress Notes (Signed)
Pt discharged to lobby. Pt was stable and appreciative at that time. All papers and prescriptions were given and valuables returned. Verbal understanding expressed. Denies SI/HI and A/VH. Pt given opportunity to express concerns and ask questions.  

## 2021-11-06 ENCOUNTER — Encounter (HOSPITAL_COMMUNITY): Payer: Self-pay | Admitting: Emergency Medicine

## 2021-11-06 ENCOUNTER — Other Ambulatory Visit: Payer: Self-pay

## 2021-11-06 ENCOUNTER — Emergency Department (HOSPITAL_COMMUNITY)
Admission: EM | Admit: 2021-11-06 | Discharge: 2021-11-11 | Disposition: A | Discharge status: Home / Self Care | Payer: Medicaid Other | Attending: Emergency Medicine | Admitting: Emergency Medicine

## 2021-11-06 DIAGNOSIS — F209 Schizophrenia, unspecified: Secondary | ICD-10-CM | POA: Diagnosis present

## 2021-11-06 DIAGNOSIS — Z20822 Contact with and (suspected) exposure to covid-19: Secondary | ICD-10-CM | POA: Insufficient documentation

## 2021-11-06 DIAGNOSIS — Z91199 Patient's noncompliance with other medical treatment and regimen due to unspecified reason: Secondary | ICD-10-CM

## 2021-11-06 DIAGNOSIS — Z046 Encounter for general psychiatric examination, requested by authority: Secondary | ICD-10-CM | POA: Diagnosis not present

## 2021-11-06 LAB — COMPREHENSIVE METABOLIC PANEL
ALT: 22 U/L (ref 0–44)
AST: 25 U/L (ref 15–41)
Albumin: 4.7 g/dL (ref 3.5–5.0)
Alkaline Phosphatase: 61 U/L (ref 38–126)
Anion gap: 9 (ref 5–15)
BUN: 7 mg/dL (ref 6–20)
CO2: 27 mmol/L (ref 22–32)
Calcium: 9.1 mg/dL (ref 8.9–10.3)
Chloride: 106 mmol/L (ref 98–111)
Creatinine, Ser: 0.7 mg/dL (ref 0.61–1.24)
GFR, Estimated: >60 mL/min (ref >60)
Glucose, Bld: 89 mg/dL (ref 70–99)
Potassium: 4.2 mmol/L (ref 3.5–5.1)
Sodium: 142 mmol/L (ref 135–145)
Total Bilirubin: 0.4 mg/dL (ref 0.3–1.2)
Total Protein: 8.1 g/dL (ref 6.5–8.1)

## 2021-11-06 LAB — RESP PANEL BY RT-PCR (FLU A&B, COVID) ARPGX2
Influenza A by PCR: NEGATIVE
Influenza B by PCR: NEGATIVE
SARS Coronavirus 2 by RT PCR: NEGATIVE

## 2021-11-06 LAB — CBC
HCT: 49.8 % (ref 39.0–52.0)
Hemoglobin: 17 g/dL (ref 13.0–17.0)
MCH: 33.7 pg (ref 26.0–34.0)
MCHC: 34.1 g/dL (ref 30.0–36.0)
MCV: 98.6 fL (ref 80.0–100.0)
Platelets: 302 10*3/uL (ref 150–400)
RBC: 5.05 MIL/uL (ref 4.22–5.81)
RDW: 13.5 % (ref 11.5–15.5)
WBC: 7.7 10*3/uL (ref 4.0–10.5)
nRBC: 0 % (ref 0.0–0.2)

## 2021-11-06 LAB — RAPID URINE DRUG SCREEN, HOSP PERFORMED
Amphetamines: NOT DETECTED
Barbiturates: NOT DETECTED
Benzodiazepines: NOT DETECTED
Cocaine: NOT DETECTED
Opiates: NOT DETECTED
Tetrahydrocannabinol: NOT DETECTED

## 2021-11-06 LAB — ETHANOL: Alcohol, Ethyl (B): 229 mg/dL — ABNORMAL HIGH (ref <10)

## 2021-11-06 MED ORDER — LORAZEPAM 2 MG/ML IJ SOLN: 0.0000 mg | Freq: Two times a day (BID) | INTRAMUSCULAR | Status: AC

## 2021-11-06 MED ORDER — LORAZEPAM 1 MG PO TABS
0.0000 mg | ORAL_TABLET | Freq: Four times a day (QID) | ORAL | Status: AC
  Administered 2021-11-07: 1 mg via ORAL
  Filled 2021-11-06 (×2): qty 1

## 2021-11-06 MED ORDER — OLANZAPINE 10 MG PO TABS
10.0000 mg | ORAL_TABLET | Freq: Every day | ORAL | Status: DC
  Administered 2021-11-06 – 2021-11-10 (×5): 10 mg via ORAL
  Filled 2021-11-06 (×5): qty 1

## 2021-11-06 MED ORDER — LORAZEPAM 1 MG PO TABS
1.0000 mg | ORAL_TABLET | ORAL | Status: AC | PRN
  Administered 2021-11-07: 1 mg via ORAL

## 2021-11-06 MED ORDER — OLANZAPINE 5 MG PO TABS
5.0000 mg | ORAL_TABLET | Freq: Every day | ORAL | Status: DC
  Administered 2021-11-06 – 2021-11-11 (×6): 5 mg via ORAL
  Filled 2021-11-06 (×6): qty 1

## 2021-11-06 MED ORDER — LORAZEPAM 1 MG PO TABS
0.0000 mg | ORAL_TABLET | Freq: Two times a day (BID) | ORAL | Status: AC
  Administered 2021-11-09: 1 mg via ORAL
  Filled 2021-11-06: qty 1

## 2021-11-06 MED ORDER — NICOTINE POLACRILEX 2 MG MT GUM
2.0000 mg | CHEWING_GUM | OROMUCOSAL | Status: DC | PRN
  Administered 2021-11-07 – 2021-11-09 (×10): 2 mg via ORAL
  Filled 2021-11-06 (×10): qty 1

## 2021-11-06 MED ORDER — ZIPRASIDONE MESYLATE 20 MG IM SOLR: 20.0000 mg | Freq: Two times a day (BID) | INTRAMUSCULAR | Status: DC | PRN

## 2021-11-06 MED ORDER — THIAMINE MONONITRATE 100 MG PO TABS
100.0000 mg | ORAL_TABLET | Freq: Every day | ORAL | Status: DC
  Administered 2021-11-06 – 2021-11-11 (×5): 100 mg via ORAL
  Filled 2021-11-06 (×5): qty 1

## 2021-11-06 MED ORDER — LORAZEPAM 2 MG/ML IJ SOLN: 0.0000 mg | Freq: Four times a day (QID) | INTRAMUSCULAR | Status: AC
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
## 2021-11-06 NOTE — ED Notes (Signed)
Patient brought in by police with IVC papers petitioned by mother Shirlean Mylar on 11/06/21. Nira Conn, NP did the first exam on 11/06/21 will expire on 11/13/21.

## 2021-11-06 NOTE — ED Notes (Signed)
Pt has one belonging bag placed in RES cabinets. Pt wanded by security.
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
## 2021-11-06 NOTE — ED Notes (Addendum)
1 pt belongings bag placed on top shelf of [address] A cabinet.
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
## 2021-11-07 NOTE — ED Provider Notes (Signed)
Emergency Medicine Observation Re-evaluation Note  Shane Allen is a 36 y.o. male, seen on rounds today.  Pt initially presented to the ED for complaints of IVC Currently, the patient is resting quietly.  Physical Exam  BP 125/85 (BP Location: Right Arm)   Pulse (!) 56   Temp 97.8 F (36.6 C) (Oral)   Resp 18   SpO2 95%  Physical Exam General: No acute distress Cardiac: Well-perfused Lungs: Nonlabored Psych: Quiet  ED Course / MDM  EKG:EKG Interpretation  Date/Time:  Thursday November 06 2021 11:21:18 EDT Ventricular Rate:  80 PR Interval:  142 QRS Duration: 96 QT Interval:  360 QTC Calculation: 415 R Axis:   81 Text Interpretation: Normal sinus rhythm Normal ECG Benign early repolarization When compared with ECG of 08-Sep-2021 21:06, PREVIOUS ECG IS PRESENT Confirmed by Vivi Barrack [phone]) on 11/06/2021 11:56:17 AM  I have reviewed the labs performed to date as well as medications administered while in observation.  Recent changes in the last 24 hours include psychiatry evaluation.  Plan  Current plan is for inpatient psychiatric care.    Terrilee Files, MD 11/07/21 [phone]

## 2021-11-07 NOTE — ED Notes (Signed)
Pt CIWA score increased from 0 to 4

## 2021-11-07 NOTE — Progress Notes (Signed)
Per [facility] pt is under review and requesting the following: IVC paperwork which provider Nira Conn, NP has agreed to send via fax [phone]. CSW also faxed requested labs. CSW will assist and follow with placement.   Maryjean Ka, MSW, [facility] 11/07/2021 3:32 PM

## 2021-11-07 NOTE — Progress Notes (Signed)
Inpatient Behavioral Health  Pt meets inpatient criteria per Celesta Gentile.  There are no available beds at [facility] per [facility] Gretta Arab, RN. Referral was sent to the following facilities;   Destination Service Provider Address Phone Fax  [facility]  [address] [place] [address] [phone]  [facility]  [address] [place], [place] [address] [phone]  [facility]  [address] [place] [address] [phone]  [facility]  [address] [place] [address] [phone]  [facility]  [address], [place] [address] [phone]  [facility]  [address], [place] [address] [phone]  [facility]  [address] [place] [address] [phone]  [facility]  [address], [place] [address] [phone]  [facility]  [address] [place] [address] [phone]  [facility]  [address], [place] [address] [phone]  [facility]  [address] [place] [address] [phone]  [facility]  [address] [place], [place] [address] [phone]    Situation ongoing,  CSW will follow up.   Maryjean Ka, MSW, LCSWA 11/07/2021  @ 2:53 AM

## 2021-11-07 NOTE — ED Notes (Signed)
Pt asking to talk to a psychiatrist. Pt is asking to be discharged.

## 2021-11-07 NOTE — ED Notes (Signed)
Will update pt vitals once he wakes up.

## 2021-11-08 NOTE — ED Notes (Signed)
Bedside table cleared. Pt given ham sandwich and cola.

## 2021-11-08 NOTE — ED Provider Notes (Signed)
Emergency Medicine Observation Re-evaluation Note  Shane Allen is a 36 y.o. male, seen on rounds today.  Pt initially presented to the ED for complaints of IVC Currently, the patient is awake and alert.  Physical Exam  BP (!) 133/101 (BP Location: Left Arm)   Pulse 71   Temp 98 F (36.7 C) (Oral)   Resp 16   SpO2 98%  Physical Exam General: alert Cardiac: rr Lungs: clear Psych: calm  ED Course / MDM  EKG:EKG Interpretation  Date/Time:  Thursday November 06 2021 11:21:18 EDT Ventricular Rate:  80 PR Interval:  142 QRS Duration: 96 QT Interval:  360 QTC Calculation: 415 R Axis:   81 Text Interpretation: Normal sinus rhythm Normal ECG Benign early repolarization When compared with ECG of 08-Sep-2021 21:06, PREVIOUS ECG IS PRESENT Confirmed by Cindee Lame [phone]) on 11/06/2021 11:56:17 AM  I have reviewed the labs performed to date as well as medications administered while in observation.  Recent changes in the last 24 hours include none.  Plan  Current plan is for awaiting psych placement.    Shane Pence, MD 11/08/21 1015

## 2021-11-08 NOTE — ED Notes (Signed)
IVC expires 9-21

## 2021-11-08 NOTE — Progress Notes (Signed)
CSW followed up with Pamala Hurry with [facility] in reference to referral sent for placement. It was reported the patient was declined due to no appropriate beds available.   Glennie Isle, MSW, [facility] Phone: [phone] Disposition/TOC

## 2021-11-08 NOTE — ED Notes (Signed)
Pt sleeping will obtain vitals when he wakes

## 2021-11-08 NOTE — ED Notes (Signed)
Pt taken to TCU to use shower

## 2021-11-09 MED ORDER — NICOTINE POLACRILEX 2 MG MT GUM
4.0000 mg | CHEWING_GUM | OROMUCOSAL | Status: DC | PRN
  Administered 2021-11-09 – 2021-11-10 (×4): 4 mg via ORAL
  Filled 2021-11-09 (×6): qty 2

## 2021-11-09 MED ORDER — NICOTINE 21 MG/24HR TD PT24
21.0000 mg | MEDICATED_PATCH | Freq: Every day | TRANSDERMAL | Status: DC
  Filled 2021-11-09: qty 1

## 2021-11-09 NOTE — ED Provider Notes (Signed)
Emergency Medicine Observation Re-evaluation Note  Shane Allen is a 36 y.o. male, seen on rounds today.  Pt initially presented to the ED for complaints of IVC Currently, the patient is sleeping.  Physical Exam  BP 110/81 (BP Location: Left Arm)   Pulse 60   Temp (!) 97.3 F (36.3 C) (Oral)   Resp 16   SpO2 98%  Physical Exam General: No acute distress Cardiac: Well-perfused Lungs: Nonlabored Psych: Cooperative  ED Course / MDM  EKG:EKG Interpretation  Date/Time:  Thursday November 06 2021 11:21:18 EDT Ventricular Rate:  80 PR Interval:  142 QRS Duration: 96 QT Interval:  360 QTC Calculation: 415 R Axis:   81 Text Interpretation: Normal sinus rhythm Normal ECG Benign early repolarization When compared with ECG of 08-Sep-2021 21:06, PREVIOUS ECG IS PRESENT Confirmed by Cindee Lame [phone]) on 11/06/2021 11:56:17 AM  I have reviewed the labs performed to date as well as medications administered while in observation.  Recent changes in the last 24 hours include none.  Plan  Current plan is for psychiatric placement.    Hayden Rasmussen, MD 11/09/21 1750

## 2021-11-09 NOTE — Consult Note (Signed)
Telepsych Consultation   Reason for Consult:  IVC Referring Physician:  Jackelyn Poling, NP Location of Patient:  [place] [address] Location of Provider: Behavioral Health TTS Department  Patient Identification: Shane Allen MRN:  [id number] Principal Diagnosis: Schizophrenia, unspecified type [facility]) Diagnosis:  Principal Problem:   Schizophrenia, unspecified type (HCC) Active Problems:   Noncompliance   Involuntary commitment   Total Time spent with patient: 20 minutes  Subjective:   Shane Allen is a 36 y.o. male patient admitted with schizophrenia. Patient presents laying in bed; alert and oriented x3; guarded. Reports "an argument with my mom and I didn't do anything. I'm not hurting myself or anybody. I'm not wanting to kill myself or anybody" as reason for admission. Minimal insight into illness, treatment. He reports not having money to get his medication as reason for medication non-compliance, admits he does spend money on beer. Minimizing and denying events that led to IVC. Perseverative on discharge; not processing reasons for needing inpatient hospitalization. States mom is in hospital; unable to state reason, but did provide permission to speak with  mom for collateral information. He denies any SI/HI/AVH.   Collateral: Shirlean Mylar [phone] no answer/voicemail  HPI:  Shane Allen is a 36 year old male with past psychiatric history of schizophrenia who presented to [facility] under IVC after threatening to kill his mother and becoming aggressive towards her. Per chart review, patient admitted to [facility] 09/09/21 with similar presentation. Currently unemployed; receives SSI, lives with mother. No ACTT or community mental health services noted. UDS-, BAL 229. PDMP reviewed, no active prescriptions noted.   Past Psychiatric History: schizophrenia.   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   Schizo affective schizophrenia [facility])     Past Surgical History:  Procedure Laterality Date   NO PAST SURGERIES     Family History:  Family History  Problem Relation Age of Onset   Diabetes Mother    Family Psychiatric  History: not noted Social History:  Social History   Substance and Sexual Activity  Alcohol Use Yes   Comment: 4 to 5 beers a day every other week for sleep     Social History   Substance and Sexual Activity  Drug Use Yes   Types: Marijuana    Social History   Socioeconomic History   Marital status: Single    Spouse name: Not on file   Number of children: Not on file   Years of education: Not on file   Highest education level: Not on file  Occupational History   Not on file  Tobacco Use   Smoking status: Former    Packs/day: 1.00    Years: 10.00    Total pack years: 10.00    Types: Cigarettes   Smokeless tobacco: Never  Vaping Use   Vaping Use: Unknown  Substance and Sexual Activity   Alcohol use: Yes    Comment: 4 to 5 beers a day every other week for sleep   Drug use: Yes    Types: Marijuana   Sexual activity: Never    Birth control/protection: None  Other Topics Concern   Not on file  Social History Narrative   Not on file  Social Determinants of Health   Financial Resource Strain: Not on file  Food Insecurity: Not on file  Transportation Needs: Not on file  Physical Activity: Not on file  Stress: Not on file  Social Connections: Not on file   Additional Social History:    Allergies:   Allergies  Allergen Reactions   Haldol [Haloperidol Lactate] Other (See Comments)    Uncontrolled muscle movement   Maalox [Calcium Carbonate Antacid] Other (See Comments)    Thinks this medication makes his face droop, but it could be another medication.   Tylenol With Codeine #3  [Acetaminophen-Codeine] Palpitations    Labs: No results found for this or any previous visit (from the past 48 hour(s)).  Medications:  Current Facility-Administered Medications  Medication Dose Route Frequency Provider Last Rate Last Admin   LORazepam (ATIVAN) injection 0-4 mg  0-4 mg Intravenous Q12H Nira Conn A, NP       Or   LORazepam (ATIVAN) tablet 0-4 mg  0-4 mg Oral Q12H Nira Conn A, NP       nicotine (NICODERM CQ - dosed in mg/24 hours) patch 21 mg  21 mg Transdermal Daily Molpus, John, MD       nicotine polacrilex (NICORETTE) gum 2 mg  2 mg Oral PRN Arby Barrette, MD   2 mg at 11/09/21 0933   OLANZapine (ZYPREXA) tablet 10 mg  10 mg Oral QHS Nira Conn A, NP   10 mg at 11/08/21 2203   OLANZapine (ZYPREXA) tablet 5 mg  5 mg Oral Daily Nira Conn A, NP   5 mg at 11/09/21 8466   thiamine (VITAMIN B1) tablet 100 mg  100 mg Oral Daily Nira Conn A, NP   100 mg at 11/09/21 5993   ziprasidone (GEODON) injection 20 mg  20 mg Intramuscular Q12H PRN Jackelyn Poling, NP       Current Outpatient Medications  Medication Sig Dispense Refill   OLANZapine (ZYPREXA) 15 MG tablet Take 1 tablet (15 mg total) by mouth at bedtime. 30 tablet 0   OLANZapine (ZYPREXA) 5 MG tablet Take 1 tablet (5 mg total) by mouth daily. 30 tablet 0    Musculoskeletal: Strength & Muscle Tone: within normal limits Gait & Station: normal Patient leans: N/A  Psychiatric Specialty Exam:  Presentation  General Appearance: Disheveled  Eye Contact:Fair  Speech:Clear and Coherent  Speech Volume:Normal  Handedness:Right   Mood and Affect  Mood:Dysphoric  Affect:Restricted   Thought Process  Thought Processes:Goal Directed  Descriptions of Associations:Tangential  Orientation:Full (Time, Place and Person)  Thought Content:Illogical; Tangential  History of Schizophrenia/Schizoaffective disorder:Yes  Duration of Psychotic Symptoms:Greater than six  months  Hallucinations:Hallucinations: None  Ideas of Reference:None  Suicidal Thoughts:Suicidal Thoughts: No  Homicidal Thoughts:Homicidal Thoughts: No   Sensorium  Memory:Remote Fair; Recent Fair; Immediate Fair  Judgment:Poor  Insight:Poor   Executive Functions  Concentration:Fair  Attention Span:Fair  Recall:Fair  Fund of Knowledge:Fair  Language:Fair   Psychomotor Activity  Psychomotor Activity:Psychomotor Activity: Normal  Assets  Assets:Resilience; Physical Health; Financial Resources/Insurance; Social Support   Sleep  Sleep:Sleep: Good   Physical Exam: Physical Exam Vitals and nursing note reviewed.  Constitutional:      Appearance: He is normal weight.  HENT:     Head: Normocephalic.     Nose: Nose normal.     Mouth/Throat:     Mouth: Mucous membranes are moist.     Pharynx: Oropharynx is clear.  Eyes:     Pupils: Pupils are equal, round, and reactive  to light.  Cardiovascular:     Rate and Rhythm: Normal rate.     Pulses: Normal pulses.  Pulmonary:     Effort: Pulmonary effort is normal.  Abdominal:     Palpations: Abdomen is soft.  Musculoskeletal:        General: Normal range of motion.     Cervical back: Normal range of motion.  Skin:    General: Skin is warm and dry.  Neurological:     Mental Status: He is alert and oriented to person, place, and time.  Psychiatric:        Attention and Perception: He does not perceive auditory or visual hallucinations.        Mood and Affect: Affect is inappropriate.        Speech: Speech is tangential.        Behavior: Behavior is uncooperative.        Thought Content: Thought content is delusional. Thought content does not include homicidal or suicidal ideation. Thought content does not include homicidal or suicidal plan.        Judgment: Judgment is inappropriate.    Review of Systems  Psychiatric/Behavioral:  Positive for substance abuse.   All other systems reviewed and are  negative.  Blood pressure 110/81, pulse 60, temperature (!) 97.3 F (36.3 C), temperature source Oral, resp. rate 16, SpO2 98 %. There is no height or weight on file to calculate BMI.  Treatment Plan Summary: Daily contact with patient to assess and evaluate symptoms and progress in treatment, Medication management, and Plan continue to seek inpatient psychiatric hospitalization.   Disposition: Recommend psychiatric Inpatient admission when medically cleared. Supportive therapy provided about ongoing stressors. Discussed crisis plan, support from social network, calling 911, coming to the Emergency Department, and calling Suicide Hotline.  This service was provided via telemedicine using a 2-way, interactive audio and video technology.  Names of all persons participating in this telemedicine service and their role in this encounter. Name: Oneida Alar Role: PMHNP  Name: Janine Limbo Role: Attending MD  Name: Shane Allen Role: patient  Name:  Role:     Inda Merlin, NP 11/09/2021 2:14 PM

## 2021-11-09 NOTE — ED Notes (Signed)
Patient has been alert this shift. Patient has been cooperative. Patient has been medication compliant. No suicidal ideation noted. No homicidal ideation noted.

## 2021-11-09 NOTE — ED Notes (Signed)
When patient first came back to TCU, he kept asking about when he could leave. I explained to him the plan of inpatient tx. He asked several times if the psychiatrist would change his mind about the plan for him. I informed him of daily assessment. He stated his concern about his Mom. After providing him a snack and drink, patient was able to go to sleep.

## 2021-11-09 NOTE — Progress Notes (Signed)
Per Oneida Alar, NP, patient meets criteria for inpatient treatment. There are no available beds at [facility] today. CSW faxed referrals to the following facilities for review:  [facility] [place] [address] [phone] --  [facility] [place], [place] [address] [phone] --  [facility]  Pending - Request Sent N/A [address] [place] [address] [phone] --  [facility]  Pending - Request Sent N/A [address] [place] [address] [phone] --  [facility]  Pending - Request Sent N/A [place] [address] [place] [address] [phone] --  [facility]  Pending - Request Sent N/A [address] [place] [address] [phone] --  [place] N/A [address] [place] [address] [phone] --  [facility]  Pending - Request Sent N/A [address] [place] [address] [phone] --  [facility]  Pending - Request Sent N/A [address], [place] [address] [phone] --  [place] N/A [address], [place] [address] [phone] --  [facility]  Pending - Request Sent N/A [address] [place] [address] [phone] --  [facility]  Pending - Request Sent N/A [place], [place] [address] [phone] --   TTS will continue to seek bed placement.  Glennie Isle, MSW, [facility] Phone: [phone] Disposition/TOC

## 2021-11-09 NOTE — ED Notes (Signed)
Patient requesting a nicotine patch and states he smokes 1 PPD. Notified Dr. Florina Ou.

## 2021-11-10 MED ORDER — NICOTINE POLACRILEX 2 MG MT GUM
6.0000 mg | CHEWING_GUM | OROMUCOSAL | Status: DC | PRN
  Administered 2021-11-10 – 2021-11-11 (×3): 6 mg via ORAL
  Filled 2021-11-10 (×4): qty 3

## 2021-11-10 NOTE — ED Notes (Signed)
Patient was calm and cooperative. He took his meds without any difficulties. He slept throughout the night.

## 2021-11-10 NOTE — ED Provider Notes (Signed)
Emergency Medicine Observation Re-evaluation Note  Shane Allen is a 36 y.o. male, seen on rounds today.  Pt initially presented to the ED for complaints of IVC Currently, the patient is sleeping.  Physical Exam  BP 129/77 (BP Location: Left Arm)   Pulse (!) 57   Temp (!) 97.2 F (36.2 C) (Oral)   Resp 16   SpO2 98%  Physical Exam General: No acute distress Cardiac: Well-perfused Lungs: Even and unlabored Psych: No agitation  ED Course / MDM  EKG:EKG Interpretation  Date/Time:  Thursday November 06 2021 11:21:18 EDT Ventricular Rate:  80 PR Interval:  142 QRS Duration: 96 QT Interval:  360 QTC Calculation: 415 R Axis:   81 Text Interpretation: Normal sinus rhythm Normal ECG Benign early repolarization When compared with ECG of 08-Sep-2021 21:06, PREVIOUS ECG IS PRESENT Confirmed by Cindee Lame [phone]) on 11/06/2021 11:56:17 AM  I have reviewed the labs performed to date as well as medications administered while in observation.  Recent changes in the last 24 hours include psychiatry evaluated the patient and recommended inpatient admission.  Plan  Current plan is for psychiatric placement.        Regan Lemming, MD 11/10/21 [phone]

## 2021-11-11 NOTE — Progress Notes (Signed)
Pt has been psych cleared per Laretta Bolster, FNP. This CSW will now remove this pt from the [facility] shift report. TOC to assist with any discharge needs.  Benjaman Kindler, MSW, [facility] 11/11/2021 6:49 PM

## 2021-11-11 NOTE — Progress Notes (Signed)
Inpatient Behavioral Health Placement  Pt continues to meet inpatient criteria. There are no available beds at [facility]. Referral was sent to the following facilities;   Destination Service Provider Address Phone Fax  [facility]  [address], [place] [address] [phone] [place], [place] [address] [phone]  [facility]  [place] [address] [place] [address] [place]  [facility]  [address], [place] [address] [phone]  [place] [address] [place] [address] [phone]  [facility]  [address], [place] [address] [phone] [place]  [address], [place] [address] [phone] [place]  [address] [place] [address] [phone] [facility]  [address] [place] [address] [phone] [facility]  [address], [place] [address] [phone] [place], [place] [address] [phone] [facility]  [place], [place] [address] [phone] [facility]  [address], [place] [address] [phone]    Situation ongoing,  CSW will follow up.   Benjaman Kindler, MSW, LCSWA 11/11/2021  @ 3:23 AM

## 2021-11-11 NOTE — Discharge Instructions (Signed)
Return for any problem.  ?

## 2021-11-11 NOTE — ED Notes (Signed)
Patient discharged off unit per provider. Patient alert, cooperative, no s/s of distress.  Discharge information given to patient. Belongings given to patient. Patient ambulatory off unit, escorted by NT. Patient given bus pass for transport.

## 2021-11-11 NOTE — ED Notes (Signed)
Patient slept approx 6 hours last night after being up constantly asking for Nicorette gum and coffee.

## 2021-11-11 NOTE — ED Notes (Signed)
Patient alert. Cooperative.  Medication compliant.  No homicidal ideation noted. No suicidal ideation noted.

## 2021-11-11 NOTE — ED Provider Notes (Signed)
Emergency Medicine Observation Re-evaluation Note  Shane Allen is a 36 y.o. male, seen on rounds today.  Pt initially presented to the ED for complaints of IVC Currently, the patient is sleeping.  Physical Exam  BP 106/78 (BP Location: Right Arm)   Pulse 74   Temp 98 F (36.7 C) (Oral)   Resp 14   SpO2 98%  Physical Exam General: No acute distress Cardiac: Well-perfused Lungs: Even and unlabored Psych: No agitation  ED Course / MDM  EKG:EKG Interpretation  Date/Time:  Thursday November 06 2021 11:21:18 EDT Ventricular Rate:  80 PR Interval:  142 QRS Duration: 96 QT Interval:  360 QTC Calculation: 415 R Axis:   81 Text Interpretation: Normal sinus rhythm Normal ECG Benign early repolarization When compared with ECG of 08-Sep-2021 21:06, PREVIOUS ECG IS PRESENT Confirmed by Cindee Lame [phone]) on 11/06/2021 11:56:17 AM  I have reviewed the labs performed to date as well as medications administered while in observation.  Recent changes in the last 24 hours include none.  Plan  Current plan is for psychiatric placement.        Regan Lemming, MD 11/10/21 1497    Regan Lemming, MD 11/11/21 [phone]

## 2021-11-11 NOTE — Consult Note (Signed)
Telepsych Consultation   Reason for Consult: IVC Referring Physician: Dr. Francia Greaves Location of Patient: [name] Location of Provider: [facility]  Patient Identification: Shane Allen MRN:  [id number] Principal Diagnosis: Schizophrenia, unspecified type [facility]) Diagnosis:  Principal Problem:   Schizophrenia, unspecified type ([place]) Active Problems:   Noncompliance   Involuntary commitment   Total Time spent with patient: 1 hour  Subjective:   Shane Allen is a 36 y.o. male patient.  HPI: As per initial [name] intake notes: Shane Allen is a 36 year old male with past psychiatric history of schizophrenia who presented to [facility] under IVC after threatening to kill his mother and becoming aggressive towards her. Per chart review, patient admitted to [facility] 09/09/21 with similar presentation. Currently unemployed; receives SSI, lives with mother. No ACTT or community mental health services noted. UDS-, BAL 229. PDMP reviewed, no active prescriptions noted.   Assessment: On assessment today via telepsych, patient is seen and examined sitting in a private room in [facility].  Appears calm and participating in the exam.  Chart reviewed and findings shared with the treatment team and discussed with Dr. Mamie Levers via this note.  Patient alert and oriented x4, to person, place, time, and situation.  When asked what brought patient to the hospital states, "I had an argument with my mom.  I was not threatening her or anything like that and it did not even last for 2 minutes, but she IVC'd me."  When asked where he will return to after discharge from the hospital, stated, "I am going back to my mom."  Patient mom Shane Allen at [phone] called and made aware that patient was calm, without any threats or suicidal/homicidal ideations, and if she would pick up the patient from the emergency room.  Patient's mother stated, "I will pick him up in 1 hour."  Dr.  Francia Greaves informed to rescind patient's IVC and informed about patient's mother decision to pick up patient.  Patient denies SI, HI, AVH and added that he will be taking his medications and decrease drinking alcohol. Endorse sleeping for 8 hours last night, having good appetite. Denies access to firearms, denies self injurious behavior, denies drug use and denies family history of mental illness. Patient reports drinking beer occasionally but not dependent on it and smoking 1 pack of cigarette daily.  Instructions provided on cessation of smoking and substance use as they adversely affect overall psychiatric and medical wellbeing.  Patient nodded in agreement. Patient reports having appointment scheduled with [facility] for therapy and psychiatric treatment follow-up on [address]  [place].  Disposition: Patient is psych cleared, and may be discharged to home with mom when medically stable.  [name] treatment team and [name] physician made aware of disposition.  Past Psychiatric History: Schizophrenia  Risk to Self: no Risk to Others: no Prior Inpatient Therapy: no  Prior Outpatient Therapy: Yes           Past Medical History:  Past Medical History:  Diagnosis Date   Depression    Gum disease since 2016   states its a hereditary disease that causes teeth and roots to rot.   Schizo affective schizophrenia [facility])     Past Surgical History:  Procedure Laterality Date   NO PAST SURGERIES     Family History:  Family History  Problem Relation Age of Onset   Diabetes Mother    Family Psychiatric  History: Not noted Social History:  Social History   Substance and Sexual Activity  Alcohol Use Yes   Comment: 4 to 5 beers a day every other week for sleep     Social History   Substance and Sexual Activity  Drug Use Yes   Types: Marijuana    Social History   Socioeconomic History   Marital status: Single    Spouse name: Not on file    Number of children: Not on file   Years of education: Not on file   Highest education level: Not on file  Occupational History   Not on file  Tobacco Use   Smoking status: Former    Packs/day: 1.00    Years: 10.00    Total pack years: 10.00    Types: Cigarettes   Smokeless tobacco: Never  Vaping Use   Vaping Use: Unknown  Substance and Sexual Activity   Alcohol use: Yes    Comment: 4 to 5 beers a day every other week for sleep   Drug use: Yes    Types: Marijuana   Sexual activity: Never    Birth control/protection: None  Other Topics Concern   Not on file  Social History Narrative   Not on file   Social Determinants of Health   Financial Resource Strain: Not on file  Food Insecurity: Not on file  Transportation Needs: Not on file  Physical Activity: Not on file  Stress: Not on file  Social Connections: Not on file   Additional Social History:    Allergies:   Allergies  Allergen Reactions   Haldol [Haloperidol Lactate] Other (See Comments)    Uncontrolled muscle movement   Maalox [Calcium Carbonate Antacid] Other (See Comments)    Thinks this medication makes his face droop, but it could be another medication.   Tylenol With Codeine #3 [Acetaminophen-Codeine] Palpitations    Labs: No results found for this or any previous visit (from the past 48 hour(s)).  Medications:  Current Facility-Administered Medications  Medication Dose Route Frequency Provider Last Rate Last Admin   nicotine polacrilex (NICORETTE) gum 6 mg  6 mg Oral PRN Valarie Merino, MD   6 mg at 11/11/21 1352   OLANZapine (ZYPREXA) tablet 10 mg  10 mg Oral QHS Lindon Romp A, NP   10 mg at 11/10/21 2142   OLANZapine (ZYPREXA) tablet 5 mg  5 mg Oral Daily Lindon Romp A, NP   5 mg at 11/11/21 [id number]   thiamine (VITAMIN B1) tablet 100 mg  100 mg Oral Daily Lindon Romp A, NP   100 mg at 11/11/21 0911   ziprasidone (GEODON) injection 20 mg  20 mg Intramuscular Q12H PRN Rozetta Nunnery, NP        Current Outpatient Medications  Medication Sig Dispense Refill   OLANZapine (ZYPREXA) 15 MG tablet Take 1 tablet (15 mg total) by mouth at bedtime. 30 tablet 0   OLANZapine (ZYPREXA) 5 MG tablet Take 1 tablet (5 mg total) by mouth daily. 30 tablet 0    Musculoskeletal: Strength & Muscle Tone: within normal limits Gait & Station: normal Patient leans: N/A  Psychiatric Specialty Exam:  Presentation  General Appearance: Appropriate for Environment; Casual; Fairly Groomed  Eye Contact:Good  Speech:Clear and Coherent  Speech Volume:Normal  Handedness:Right  Mood and Affect  Mood:Euthymic  Affect:Appropriate; Congruent   Thought Process  Thought Processes:Coherent; Goal Directed  Descriptions of Associations:Intact  Orientation:Full (Time, Place and Person)  Thought Content:Logical  History of Schizophrenia/Schizoaffective disorder:Yes  Duration of Psychotic Symptoms:Greater than six months  Hallucinations:Hallucinations: None  Ideas of Reference:None  Suicidal Thoughts:Suicidal Thoughts: No  Homicidal Thoughts:Homicidal Thoughts: No  Sensorium  Memory:Immediate Good; Recent Good; Remote Good  Judgment:Fair  Insight:Fair  Executive Functions  Concentration:Good  Attention Span:Good  [place]  Language:Good  Psychomotor Activity  Psychomotor Activity:Psychomotor Activity: Normal  Assets  Assets:Communication Skills; Desire for Improvement; Physical Health; Resilience  Sleep  Sleep:Sleep: Good Number of Hours of Sleep: 8  Physical Exam: Physical Exam Vitals and nursing note reviewed.  Constitutional:      Appearance: Normal appearance.  HENT:     Head: Normocephalic and atraumatic.     Nose: Nose normal.     Mouth/Throat:     Mouth: Mucous membranes are moist.     Pharynx: Oropharynx is clear.  Eyes:     Extraocular Movements: Extraocular movements intact.     Conjunctiva/sclera: Conjunctivae normal.      Pupils: Pupils are equal, round, and reactive to light.  Cardiovascular:     Rate and Rhythm: Bradycardia present.     Pulses: Normal pulses.     Comments: Pulse rate 54 nursing staff to recheck pulse rate Pulmonary:     Effort: Pulmonary effort is normal.  Abdominal:     Palpations: Abdomen is soft.  Genitourinary:    Comments: Deferred Musculoskeletal:        General: Normal range of motion.     Cervical back: Normal range of motion.  Skin:    General: Skin is warm.  Neurological:     General: No focal deficit present.     Mental Status: He is alert and oriented to person, place, and time.  Psychiatric:        Mood and Affect: Mood normal.        Behavior: Behavior normal.    Review of Systems  Constitutional: Negative.  Negative for chills and fever.  HENT: Negative.  Negative for hearing loss and tinnitus.   Eyes: Negative.  Negative for blurred vision and double vision.  Respiratory: Negative.  Negative for cough, sputum production, shortness of breath and wheezing.   Cardiovascular: Negative.  Negative for chest pain and palpitations.       Pulse rate 54 nursing staff to recheck pulse rate.  Gastrointestinal: Negative.  Negative for heartburn, nausea and vomiting.  Genitourinary: Negative.  Negative for dysuria, frequency and urgency.  Musculoskeletal: Negative.  Negative for myalgias and neck pain.  Skin: Negative.  Negative for itching and rash.  Neurological: Negative.  Negative for dizziness, tingling, tremors and headaches.  Endo/Heme/Allergies: Negative.  Negative for environmental allergies. Does not bruise/bleed easily.       Haldol [Haloperidol Lactate] Haldol [Haloperidol Lactate]  Other (See Comments) Not Specified  10/26/2014 Uncontrolled muscle movement Deletion Reason:  Maalox [Calcium Carbonate Antacid] Maalox [Calcium Carbonate Antacid]  Other (See Comments) Not Specified  10/26/2014 Thinks this medication makes his face droop, but it could be another  medication. Deletion Reason:  Tylenol With Codeine #3 [Acetaminophen-codeine] Tylenol With Codeine #3 [Acetaminophen-codeine]  Palpitations Low  11/03/2011    Psychiatric/Behavioral:  Positive for depression and substance abuse.    Blood pressure 117/82, pulse (!) 54, temperature 98.3 F (36.8 C), temperature source Oral, resp. rate 20, SpO2 100 %. There is no height or weight on file to calculate BMI.  Treatment Plan Summary: Daily contact with patient to assess and evaluate symptoms and progress in treatment and Medication management  Disposition: No evidence of imminent  risk to self or others at present.   Patient does not meet criteria for psychiatric inpatient admission. Supportive therapy provided about ongoing stressors. Discussed crisis plan, support from social network, calling 911, coming to the Emergency Department, and calling Suicide Hotline.  This service was provided via telemedicine using a 2-way, interactive audio and video technology.  Names of all persons participating in this telemedicine service and their role in this encounter. Name: Ileene Hutchinson Role: Patient  Name: Garrison Columbus, NP Role: Provider  Name: Dr. Francia Greaves Role: [name] physician  Name: Dr. Mamie Levers Role: [facility] physician    Laretta Bolster, FNP 11/11/2021 4:07 PM

## 2022-02-14 ENCOUNTER — Ambulatory Visit (HOSPITAL_COMMUNITY)
Admission: EM | Admit: 2022-02-14 | Discharge: 2022-02-14 | Disposition: A | Discharge status: Home / Self Care | Payer: Medicaid Other | Attending: Emergency Medicine | Admitting: Emergency Medicine

## 2022-02-14 ENCOUNTER — Encounter (HOSPITAL_COMMUNITY): Payer: Self-pay | Admitting: Emergency Medicine

## 2022-02-14 DIAGNOSIS — M10472 Other secondary gout, left ankle and foot: Secondary | ICD-10-CM | POA: Diagnosis not present

## 2022-02-14 DIAGNOSIS — J069 Acute upper respiratory infection, unspecified: Secondary | ICD-10-CM | POA: Diagnosis not present

## 2022-02-14 MED ORDER — PREDNISONE 10 MG (21) PO TBPK: ORAL_TABLET | Freq: Every day | ORAL | 0 refills | Status: DC

## 2022-02-14 MED ORDER — BENZONATATE 100 MG PO CAPS: 100.0000 mg | ORAL_CAPSULE | Freq: Three times a day (TID) | ORAL | 0 refills | Status: DC | PRN

## 2022-02-14 MED ORDER — OXYCODONE-ACETAMINOPHEN 5-325 MG PO TABS: 1.0000 | ORAL_TABLET | Freq: Two times a day (BID) | ORAL | 0 refills | Status: DC | PRN

## 2022-02-14 MED ORDER — ALBUTEROL SULFATE HFA 108 (90 BASE) MCG/ACT IN AERS: 2.0000 | INHALATION_SPRAY | Freq: Three times a day (TID) | RESPIRATORY_TRACT | 0 refills | Status: DC | PRN

## 2022-02-14 NOTE — ED Provider Notes (Signed)
[facility]   [id number] 02/14/22 Arrival Time: 1509  Chief Complaint  Patient presents with   Foot Pain   URI    SUBJECTIVE: History from: patient.  Shane Allen is a 36 y.o. male presented to the urgent care with a complaint of left foot pain for the past few days.  Denies any acute event.  Stroke medication without relief.  Symptoms are made worse with ROM.  Reports similar symptoms in the past that was treated with steroid.   Denies  fatigue, sinus pain, rhinorrhea,chest pain, nausea, changes in bowel or bladder habits.    He is also reporting chills, fever cough, nasal congestion for the past 3 weeks.  Denies any precipitating event.  Denies any exposure to flu, COVID or strep.  Has tried OTC medication without relief.  Denies any aggravating factors.  Denies similar symptoms in the past.  Denies sinus pain rhinorrhea chest pain nausea and shortness of breath.   ROS: As per HPI.  All other pertinent ROS negative.      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   Schizo affective schizophrenia [facility])    Past Surgical History:  Procedure Laterality Date   NO PAST SURGERIES     Allergies  Allergen Reactions   Haldol [Haloperidol Lactate] Other (See Comments)    Uncontrolled muscle movement   Maalox [Calcium Carbonate Antacid] Other (See Comments)    Thinks this medication makes his face droop, but it could be another medication.   Tylenol With Codeine #3 [Acetaminophen-Codeine] Palpitations   No current facility-administered medications on file prior to encounter.   Current Outpatient Medications on File Prior to Encounter  Medication Sig Dispense Refill   OLANZapine (ZYPREXA) 15 MG tablet Take 1 tablet (15 mg total) by mouth at bedtime. 30 tablet 0   OLANZapine (ZYPREXA) 5 MG tablet Take 1 tablet (5 mg total) by mouth daily. 30 tablet 0    [DISCONTINUED] asenapine (SAPHRIS) 5 MG SUBL 24 hr tablet Place 1 tablet (5 mg total) under the tongue 2 (two) times daily. 60 tablet 0   [DISCONTINUED] risperiDONE (RISPERDAL) 3 MG tablet Take 1 tablet (3 mg total) by mouth 2 (two) times daily for 7 days. 14 tablet 0   Social History   Socioeconomic History   Marital status: Single    Spouse name: Not on file   Number of children: Not on file   Years of education: Not on file   Highest education level: Not on file  Occupational History   Not on file  Tobacco Use   Smoking status: Former    Packs/day: 1.00    Years: 10.00    Total pack years: 10.00    Types: Cigarettes   Smokeless tobacco: Never  Vaping Use   Vaping Use: Unknown  Substance and Sexual Activity   Alcohol use: Yes    Comment: 4 to 5 beers a day every other week for sleep   Drug use: Yes    Types: Marijuana   Sexual activity: Never    Birth control/protection: None  Other Topics Concern   Not on file  Social History Narrative   Not on file   Social Determinants of Health   Financial Resource Strain: Not on file  Food Insecurity: Not on file  Transportation Needs: Not on file  Physical Activity: Not on file  Stress:  Not on file  Social Connections: Not on file  Intimate Partner Violence: Not on file   Family History  Problem Relation Age of Onset   Diabetes Mother     OBJECTIVE:  Vitals:   02/14/22 1718  BP: 116/83  Pulse: 91  Resp: 18  Temp: 97.7 F (36.5 C)  TempSrc: Oral  SpO2: 98%     Physical Exam Vitals and nursing note reviewed.  Constitutional:      General: He is not in acute distress.    Appearance: Normal appearance. He is normal weight. He is not ill-appearing, toxic-appearing or diaphoretic.  HENT:     Head: Normocephalic.     Right Ear: Tympanic membrane, ear canal and external ear normal. There is no impacted cerumen.     Left Ear: Tympanic membrane, ear canal and external ear normal. There is no impacted cerumen.      Nose: Congestion present.  Cardiovascular:     Rate and Rhythm: Normal rate and regular rhythm.     Pulses: Normal pulses.     Heart sounds: Normal heart sounds. No murmur heard.    No friction rub. No gallop.  Pulmonary:     Effort: Pulmonary effort is normal. No respiratory distress.     Breath sounds: Normal breath sounds. No stridor. No wheezing, rhonchi or rales.  Chest:     Chest wall: No tenderness.  Musculoskeletal:     Left foot: Tenderness present.     Comments: Warmth and tenderness present of 4th and little toes  Neurological:     Mental Status: He is alert and oriented to person, place, and time.     LABS:  No results found for this or any previous visit (from the past 24 hour(s)).   ASSESSMENT & PLAN:  1. Acute gout due to other secondary cause involving toe of left foot   2. URI with cough and congestion     Meds ordered this encounter  Medications   predniSONE (STERAPRED UNI-PAK 21 TAB) 10 MG (21) TBPK tablet    Sig: Take by mouth daily. Take 6 tabs by mouth daily  for 1 days, then 5 tabs for 1 days, then 4 tabs for 1 days, then 3 tabs for 1 days, 2 tabs for 1 days, then 1 tab by mouth daily for 1 days    Dispense:  21 tablet    Refill:  0   benzonatate (TESSALON) 100 MG capsule    Sig: Take 1 capsule (100 mg total) by mouth 3 (three) times daily as needed for cough.    Dispense:  30 capsule    Refill:  0   albuterol (VENTOLIN HFA) 108 (90 Base) MCG/ACT inhaler    Sig: Inhale 2 puffs into the lungs every 8 (eight) hours as needed for wheezing or shortness of breath.    Dispense:  18 g    Refill:  0   Discharge instructions  Prescribed prednisone take as directed and to completion for cough no wheezing Prescribed Tessalon for cough.  Take as directed ProAir was prescribed  shortness of breath Follow-up and establish care with PCP Follow up with PCP for further evaluation and management Return or go to the ED if you have any new or worsening symptoms    Reviewed expectations re: course of current medical issues. Questions answered. Outlined signs and symptoms indicating need for more acute intervention. Patient verbalized understanding. After Visit Summary given.          Nena Jordan  S, FNP 02/14/22 1743

## 2022-02-14 NOTE — ED Triage Notes (Signed)
Pt reports left foot pain primarily around the little tow. States he is experiencing a gout flare up and also hit his left foot on something a couple days ago.   Also endorses a cough, fever, nasal congestion and itchy throat for 3 weeks. Reports taking Theraflu 2 weeks ago.

## 2022-02-14 NOTE — Discharge Instructions (Addendum)
  Prescribed prednisone take as directed and to completion for cough no wheezing Prescribed Tessalon for cough.  Take as directed ProAir was prescribed  shortness of breath Follow-up and establish care with PCP Follow up with PCP for further evaluation and management Return or go to the ED if you have any new or worsening symptoms

## 2022-07-02 ENCOUNTER — Encounter (HOSPITAL_COMMUNITY): Payer: Self-pay

## 2022-07-02 ENCOUNTER — Emergency Department (HOSPITAL_COMMUNITY)
Admission: EM | Admit: 2022-07-02 | Discharge: 2022-07-04 | Disposition: A | Discharge status: Other Acute Inpatient Hospital | Payer: No Typology Code available for payment source | Attending: Emergency Medicine | Admitting: Emergency Medicine

## 2022-07-02 DIAGNOSIS — R45851 Suicidal ideations: Secondary | ICD-10-CM | POA: Diagnosis not present

## 2022-07-02 DIAGNOSIS — Z79899 Other long term (current) drug therapy: Secondary | ICD-10-CM | POA: Diagnosis not present

## 2022-07-02 DIAGNOSIS — Y903 Blood alcohol level of 60-79 mg/100 ml: Secondary | ICD-10-CM | POA: Insufficient documentation

## 2022-07-02 DIAGNOSIS — Z9189 Other specified personal risk factors, not elsewhere classified: Secondary | ICD-10-CM

## 2022-07-02 DIAGNOSIS — Z20822 Contact with and (suspected) exposure to covid-19: Secondary | ICD-10-CM | POA: Diagnosis not present

## 2022-07-02 DIAGNOSIS — F203 Undifferentiated schizophrenia: Secondary | ICD-10-CM | POA: Diagnosis not present

## 2022-07-02 DIAGNOSIS — F1290 Cannabis use, unspecified, uncomplicated: Secondary | ICD-10-CM | POA: Diagnosis present

## 2022-07-02 DIAGNOSIS — R259 Unspecified abnormal involuntary movements: Secondary | ICD-10-CM | POA: Diagnosis present

## 2022-07-02 NOTE — ED Triage Notes (Signed)
Patient brought in by [employer] under IVC. IVC states patient has a history of schizophrenia, PTSD, Bi-polar disorder. Mother states today the patient was cutting himself, punching the dors and walls. ETOH on board. Mother states he is hearing voices and hs been drinking beer today.

## 2022-07-03 DIAGNOSIS — F203 Undifferentiated schizophrenia: Secondary | ICD-10-CM

## 2022-07-03 LAB — CBC WITH DIFFERENTIAL/PLATELET
Abs Immature Granulocytes: 0.02 10*3/uL (ref 0.00–0.07)
Basophils Absolute: 0.1 10*3/uL (ref 0.0–0.1)
Basophils Relative: 1 %
Eosinophils Absolute: 0.5 10*3/uL (ref 0.0–0.5)
Eosinophils Relative: 5 %
HCT: 43.4 % (ref 39.0–52.0)
Hemoglobin: 15.6 g/dL (ref 13.0–17.0)
Immature Granulocytes: 0 %
Lymphocytes Relative: 27 %
Lymphs Abs: 2.4 10*3/uL (ref 0.7–4.0)
MCH: 34.7 pg — ABNORMAL HIGH (ref 26.0–34.0)
MCHC: 35.9 g/dL (ref 30.0–36.0)
MCV: 96.4 fL (ref 80.0–100.0)
Monocytes Absolute: 0.9 10*3/uL (ref 0.1–1.0)
Monocytes Relative: 10 %
Neutro Abs: 5 10*3/uL (ref 1.7–7.7)
Neutrophils Relative %: 57 %
Platelets: 290 10*3/uL (ref 150–400)
RBC: 4.5 MIL/uL (ref 4.22–5.81)
RDW: 13 % (ref 11.5–15.5)
WBC: 8.8 10*3/uL (ref 4.0–10.5)
nRBC: 0 % (ref 0.0–0.2)

## 2022-07-03 LAB — COMPREHENSIVE METABOLIC PANEL
ALT: 17 U/L (ref 0–44)
AST: 28 U/L (ref 15–41)
Albumin: 4.1 g/dL (ref 3.5–5.0)
Alkaline Phosphatase: 67 U/L (ref 38–126)
Anion gap: 14 (ref 5–15)
BUN: 9 mg/dL (ref 6–20)
CO2: 22 mmol/L (ref 22–32)
Calcium: 9.1 mg/dL (ref 8.9–10.3)
Chloride: 98 mmol/L (ref 98–111)
Creatinine, Ser: 0.8 mg/dL (ref 0.61–1.24)
GFR, Estimated: >60 mL/min (ref >60)
Glucose, Bld: 83 mg/dL (ref 70–99)
Potassium: 3.6 mmol/L (ref 3.5–5.1)
Sodium: 134 mmol/L — ABNORMAL LOW (ref 135–145)
Total Bilirubin: 0.7 mg/dL (ref 0.3–1.2)
Total Protein: 7.2 g/dL (ref 6.5–8.1)

## 2022-07-03 LAB — RAPID URINE DRUG SCREEN, HOSP PERFORMED
Amphetamines: NOT DETECTED
Barbiturates: NOT DETECTED
Benzodiazepines: NOT DETECTED
Cocaine: NOT DETECTED
Opiates: NOT DETECTED
Tetrahydrocannabinol: POSITIVE — AB

## 2022-07-03 LAB — ACETAMINOPHEN LEVEL: Acetaminophen (Tylenol), Serum: <10 ug/mL — ABNORMAL LOW (ref 10–30)

## 2022-07-03 LAB — ETHANOL: Alcohol, Ethyl (B): 60 mg/dL — ABNORMAL HIGH (ref <10)

## 2022-07-03 LAB — SALICYLATE LEVEL: Salicylate Lvl: <7 mg/dL — ABNORMAL LOW (ref 7.0–30.0)

## 2022-07-03 MED ORDER — ALBUTEROL SULFATE HFA 108 (90 BASE) MCG/ACT IN AERS: 2.0000 | INHALATION_SPRAY | Freq: Three times a day (TID) | RESPIRATORY_TRACT | Status: DC | PRN

## 2022-07-03 MED ORDER — IBUPROFEN 200 MG PO TABS: 400.0000 mg | ORAL_TABLET | Freq: Four times a day (QID) | ORAL | Status: DC | PRN

## 2022-07-03 MED ORDER — TRAZODONE HCL 50 MG PO TABS
25.0000 mg | ORAL_TABLET | Freq: Every evening | ORAL | Status: DC | PRN
  Administered 2022-07-03: 25 mg via ORAL
  Filled 2022-07-03: qty 1

## 2022-07-03 MED ORDER — OLANZAPINE 5 MG PO TABS
5.0000 mg | ORAL_TABLET | Freq: Every day | ORAL | Status: DC
  Administered 2022-07-03 – 2022-07-04 (×2): 5 mg via ORAL
  Filled 2022-07-03 (×2): qty 1

## 2022-07-03 MED ORDER — OLANZAPINE 5 MG PO TABS
7.5000 mg | ORAL_TABLET | Freq: Every day | ORAL | Status: DC
  Administered 2022-07-03: 7.5 mg via ORAL
  Filled 2022-07-03: qty 1

## 2022-07-03 MED ORDER — NICOTINE 14 MG/24HR TD PT24: 14.0000 mg | MEDICATED_PATCH | Freq: Every day | TRANSDERMAL | Status: DC | PRN

## 2022-07-03 MED ORDER — OLANZAPINE 5 MG PO TABS: 15.0000 mg | ORAL_TABLET | Freq: Every day | ORAL | Status: DC

## 2022-07-03 MED ORDER — NICOTINE POLACRILEX 2 MG MT GUM: 2.0000 mg | CHEWING_GUM | OROMUCOSAL | Status: DC | PRN

## 2022-07-03 MED ORDER — NICOTINE POLACRILEX 2 MG MT GUM
2.0000 mg | CHEWING_GUM | OROMUCOSAL | Status: DC | PRN
  Administered 2022-07-03 – 2022-07-04 (×4): 2 mg via ORAL
  Filled 2022-07-03 (×4): qty 1

## 2022-07-03 NOTE — ED Notes (Signed)
TTS completed. 

## 2022-07-03 NOTE — ED Notes (Signed)
Pt has been dressed out into burgundy scrubs. Clothes have been placed into cabinet he has a shirt, shorts, shoes, socks.

## 2022-07-03 NOTE — ED Notes (Signed)
Pt belongings placed in locker 29 

## 2022-07-03 NOTE — ED Notes (Signed)
TTS machine at pt bedside 

## 2022-07-03 NOTE — ED Provider Notes (Signed)
[name] Provider Note   CSN: [id number] Arrival date & time: 07/02/22  2336     History  Chief Complaint  Patient presents with   Psychiatric Evaluation    Shane Allen is a 37 y.o. male.  HPI  Patient is a 37 year old male with past medical history significant for bipolar, PTSD, schizophrenia  She presents emergency room today under involuntary commitment from mother.  Brought in by [employer] who states that he has been compliant with them.  He is reticent and unwilling to discuss his symptoms.  He denies any pain however, and indicates that he feels well overall with no nausea or chest pain or difficulty breathing or any other pain.  He is unwilling to tell me what medications he takes however it seems that he is prescribed Zyprexa.  According to mother's paperwork he has been acting abnormally, has been hearing voices which is abnormal for him when he is on his medications.  He has also been punching doors and walls.  Patient denies any pain in his hands.  He denies any recreational drug use or alcohol use.     Home Medications Prior to Admission medications   Medication Sig Start Date End Date Taking? Authorizing Provider  albuterol (VENTOLIN HFA) 108 (90 Base) MCG/ACT inhaler Inhale 2 puffs into the lungs every 8 (eight) hours as needed for wheezing or shortness of breath. 02/14/22   Avegno, Zachery Dakins, FNP  benzonatate (TESSALON) 100 MG capsule Take 1 capsule (100 mg total) by mouth 3 (three) times daily as needed for cough. 02/14/22   Avegno, Zachery Dakins, FNP  OLANZapine (ZYPREXA) 15 MG tablet Take 1 tablet (15 mg total) by mouth at bedtime. 09/19/21 01/03/22  Massengill, Harrold Donath, MD  OLANZapine (ZYPREXA) 5 MG tablet Take 1 tablet (5 mg total) by mouth daily. 09/19/21 01/03/22  Massengill, Harrold Donath, MD  oxyCODONE-acetaminophen (PERCOCET/ROXICET) 5-325 MG tablet Take 1 tablet by mouth every 12 (twelve) hours as needed for  severe pain. 02/14/22   Avegno, Zachery Dakins, FNP  predniSONE (STERAPRED UNI-PAK 21 TAB) 10 MG (21) TBPK tablet Take by mouth daily. Take 6 tabs by mouth daily  for 1 days, then 5 tabs for 1 days, then 4 tabs for 1 days, then 3 tabs for 1 days, 2 tabs for 1 days, then 1 tab by mouth daily for 1 days 02/14/22   Avegno, Zachery Dakins, FNP  asenapine (SAPHRIS) 5 MG SUBL 24 hr tablet Place 1 tablet (5 mg total) under the tongue 2 (two) times daily. 06/15/17 05/18/19  Liberty Handy, PA-C  risperiDONE (RISPERDAL) 3 MG tablet Take 1 tablet (3 mg total) by mouth 2 (two) times daily for 7 days. 01/29/19 05/18/19  Gailen Shelter, PA      Allergies    Haldol [haloperidol lactate], Maalox [calcium carbonate antacid], and Tylenol with codeine #3 [acetaminophen-codeine]    Review of Systems   Review of Systems  Physical Exam Updated Vital Signs BP (!) 123/95 (BP Location: Left Arm)   Pulse 93   Temp 98.2 F (36.8 C) (Oral)   Resp 18   SpO2 97%  Physical Exam Vitals and nursing note reviewed.  Constitutional:      General: He is not in acute distress.    Appearance: Normal appearance. He is not ill-appearing.  HENT:     Head: Normocephalic and atraumatic.     Mouth/Throat:     Mouth: Mucous membranes are moist.  Eyes:  General: No scleral icterus.       Right eye: No discharge.        Left eye: No discharge.     Conjunctiva/sclera: Conjunctivae normal.  Pulmonary:     Effort: Pulmonary effort is normal.     Breath sounds: No stridor.  Musculoskeletal:     Comments: Patient declined palpation of hands.  Skin:    General: Skin is warm and dry.     Comments: Scabs over knuckles of bilateral hands.  No bleeding or open wounds  Neurological:     Mental Status: He is alert and oriented to person, place, and time. Mental status is at baseline.  Psychiatric:     Comments: Guarded, speech is clear and without flight of ideas or tangential thinking.     ED Results / Procedures / Treatments    Labs (all labs ordered are listed, but only abnormal results are displayed) Labs Reviewed  COMPREHENSIVE METABOLIC PANEL - Abnormal; Notable for the following components:      Result Value   Sodium 134 (*)    All other components within normal limits  ETHANOL - Abnormal; Notable for the following components:   Alcohol, Ethyl (B) 60 (*)    All other components within normal limits  CBC WITH DIFFERENTIAL/PLATELET - Abnormal; Notable for the following components:   MCH 34.7 (*)    All other components within normal limits  SALICYLATE LEVEL - Abnormal; Notable for the following components:   Salicylate Lvl <7.0 (*)    All other components within normal limits  ACETAMINOPHEN LEVEL - Abnormal; Notable for the following components:   Acetaminophen (Tylenol), Serum <10 (*)    All other components within normal limits  RAPID URINE DRUG SCREEN, HOSP PERFORMED    EKG EKG Interpretation  Date/Time:  Friday Jul 03 2022 02:02:20 EDT Ventricular Rate:  73 PR Interval:  148 QRS Duration: 100 QT Interval:  388 QTC Calculation: 427 R Axis:   91 Text Interpretation: Normal sinus rhythm Rightward axis Nonspecific ST abnormality Abnormal ECG When compared with ECG of 06-Nov-2021 11:21, PREVIOUS ECG IS PRESENT Confirmed by Drema Pry [phone]) on 07/03/2022 2:20:07 AM  Radiology No results found.  Procedures Procedures    Medications Ordered in ED Medications - No data to display  ED Course/ Medical Decision Making/ A&P                             Medical Decision Making Amount and/or Complexity of Data Reviewed Labs: ordered.   Patient is a 37 year old male with past medical history significant for bipolar, PTSD, schizophrenia  She presents emergency room today under involuntary commitment from mother.  Brought in by [employer] who states that he has been compliant with them.  He is reticent and unwilling to discuss his symptoms.  He denies any pain however, and indicates that he  feels well overall with no nausea or chest pain or difficulty breathing or any other pain.  He is unwilling to tell me what medications he takes however it seems that he is prescribed Zyprexa.  According to mother's paperwork he has been acting abnormally, has been hearing voices which is abnormal for him when he is on his medications.  He has also been punching doors and walls.  Patient denies any pain in his hands.  He denies any recreational drug use or alcohol use.  Tylenol and salicylate levels undetectable, ethanol elevated at 60 which is inconsistent with  patient's account of not drinking recently.  CBC without leukocytosis or anemia, CMP unremarkable urine drug screen pending  Patient is medically cleared at this time  I personally viewed EKG there is some Q waves in inferior leads unchanged from prior EKG done 11/10/2021.  Patient is medically cleared and awaiting psychiatry consultation.  Diet order was placed and home medications ordered.   PT is under IVC -first exam was completed.   Final Clinical Impression(s) / ED Diagnoses Final diagnoses:  At high risk for self harm    Rx / DC Orders ED Discharge Orders     None         Gailen Shelter, [place] 07/03/22 0239    Nira Conn, MD 07/03/22 [phone]

## 2022-07-03 NOTE — Progress Notes (Signed)
LCSW Progress Note  [id number]   Shane Allen  07/03/2022  11:22 PM    Inpatient Behavioral Health Placement  Pt meets inpatient criteria per Earney Navy, NP-PMHNP-BC.  There are no available beds within [facility] system per [facility] Rosey Bath, RN. Referral was sent to the following facilities;   Destination  Service Provider Address Phone [facility]  [address] [place], [place] [address] [phone]  [facility] [address]  [address] [place] [address] [phone]  [facility]  [address] [place] [address] [place] [address] [phone]  [facility]  [address] [place] [address] [phone]  [facility]  [address] [place] [address] [phone]  [facility]  [address] [place] [address] [phone]  [facility]  [address] [place], [place] [address] [phone]  [facility]  [address] [place] [address] [phone]  [facility]  [address] [place] [address] [phone]  [facility]  [address] [place] [address] [phone]  [facility]  [address] [place] [address] [phone]  [facility]  [address] [place] [address] [phone]  [facility]  [address], [place] [address] [phone]  [facility]  [address], [place] [address] [phone]  [facility]  [address] [place] [address] [phone]  [facility]  [address], [place]  [address] [phone]  [facility]  [address] [place] [address] [phone]  [facility]  [address] [place] [address] [phone]  [facility]  [address] [place] [address] [phone]     Situation ongoing,  CSW will follow up.    Maryjean Ka, MSW, LCSWA 07/03/2022 11:22 PM

## 2022-07-03 NOTE — ED Notes (Signed)
Per TTS pt to  be re-evaluated in the AM.
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
## 2022-07-03 NOTE — ED Notes (Signed)
Patient rested majority of the day. Calm, cooperative.
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
## 2022-07-04 LAB — RESP PANEL BY RT-PCR (RSV, FLU A&B, COVID)  RVPGX2
Influenza A by PCR: NEGATIVE
Influenza B by PCR: NEGATIVE
Resp Syncytial Virus by PCR: NEGATIVE
SARS Coronavirus 2 by RT PCR: NEGATIVE

## 2022-07-04 NOTE — Progress Notes (Signed)
Pt was accepted to [facility] TODAY 07/04/2022; Bed Assignment Main Campus PENDING IVC paperwork to be Faxed to [facility] Fax Number: [phone] (Adult) and Negative COVID results  Pt meets inpatient criteria per Earney Navy, NP-PMHNP-B     Attending Physician will be Dr. Loni Beckwith   Report can be called to:[phone]-Pager number, please leave a returned phone number to receive a phone call back.   Pt can arrive after 12:00(noon)  Nursing notified: Alfonzo Feller, RN   Maryjean Ka, MSW, [facility] 07/04/2022 12:25 AM

## 2022-07-04 NOTE — ED Notes (Signed)
Patient discharged off unit to facility per provider. Patient alert, calm, cooperative, no s/s of distress at this time. Patient discharge information and belongings given to Sheriff for transport. Patient ambulatory off unit, escorted and transported by Sheriff.  

## 2022-07-04 NOTE — ED Provider Notes (Signed)
Emergency Medicine Observation Re-evaluation Note  Shane Allen is a 37 y.o. male, seen on rounds today.  Pt initially presented to the ED for complaints of Psychiatric Evaluation Currently, the patient is resting.  Physical Exam  BP 133/84   Pulse 69   Temp 97.9 F (36.6 C) (Oral)   Resp 20   SpO2 99%  Physical Exam General: NAD   ED Course / MDM  EKG:EKG Interpretation  Date/Time:  Friday Jul 03 2022 02:02:20 EDT Ventricular Rate:  73 PR Interval:  148 QRS Duration: 100 QT Interval:  388 QTC Calculation: 427 R Axis:   91 Text Interpretation: Normal sinus rhythm Rightward axis Nonspecific ST abnormality Abnormal ECG When compared with ECG of 06-Nov-2021 11:21, PREVIOUS ECG IS PRESENT Confirmed by Drema Pry [phone]) on 07/03/2022 2:20:07 AM  I have reviewed the labs performed to date as well as medications administered while in observation.  Recent changes in the last 24 hours include no acute events reported.  Plan  Current plan is for Tx to [facility].    Wynetta Fines, MD 07/04/22 [phone]

## 2022-07-04 NOTE — ED Notes (Addendum)
Pt was accepted to [facility] TODAY 07/04/2022; Bed Assignment Main Campus PENDING IVC paperwork to be Faxed to [facility] Fax Number: [phone] (Adult) and Negative COVID results  Pt meets inpatient criteria per Earney Navy, NP-PMHNP-B   Attending Physician will be Dr. Loni Beckwith   Report can be called to:[phone]-Pager number, please leave a returned phone number to receive a phone call back.   Pt can arrive after 12:00(noon)

## 2022-11-14 ENCOUNTER — Encounter (HOSPITAL_COMMUNITY): Payer: Self-pay

## 2022-11-14 ENCOUNTER — Inpatient Hospital Stay (HOSPITAL_COMMUNITY)
Admission: EM | Admit: 2022-11-14 | Discharge: 2022-11-16 | DRG: 511 | Disposition: A | Discharge status: Home / Self Care | Payer: MEDICAID | Attending: Internal Medicine | Admitting: Internal Medicine

## 2022-11-14 ENCOUNTER — Emergency Department (HOSPITAL_COMMUNITY): Payer: MEDICAID

## 2022-11-14 ENCOUNTER — Other Ambulatory Visit: Payer: Self-pay

## 2022-11-14 DIAGNOSIS — F1910 Other psychoactive substance abuse, uncomplicated: Secondary | ICD-10-CM | POA: Diagnosis present

## 2022-11-14 DIAGNOSIS — Z5941 Food insecurity: Secondary | ICD-10-CM | POA: Diagnosis not present

## 2022-11-14 DIAGNOSIS — F32A Depression, unspecified: Secondary | ICD-10-CM | POA: Diagnosis present

## 2022-11-14 DIAGNOSIS — F259 Schizoaffective disorder, unspecified: Secondary | ICD-10-CM | POA: Diagnosis present

## 2022-11-14 DIAGNOSIS — Z833 Family history of diabetes mellitus: Secondary | ICD-10-CM | POA: Diagnosis not present

## 2022-11-14 DIAGNOSIS — Y903 Blood alcohol level of 60-79 mg/100 ml: Secondary | ICD-10-CM | POA: Diagnosis present

## 2022-11-14 DIAGNOSIS — F1020 Alcohol dependence, uncomplicated: Secondary | ICD-10-CM | POA: Diagnosis present

## 2022-11-14 DIAGNOSIS — S5292XA Unspecified fracture of left forearm, initial encounter for closed fracture: Secondary | ICD-10-CM | POA: Diagnosis present

## 2022-11-14 DIAGNOSIS — F17210 Nicotine dependence, cigarettes, uncomplicated: Secondary | ICD-10-CM | POA: Diagnosis present

## 2022-11-14 DIAGNOSIS — Y929 Unspecified place or not applicable: Secondary | ICD-10-CM

## 2022-11-14 DIAGNOSIS — Z79899 Other long term (current) drug therapy: Secondary | ICD-10-CM | POA: Diagnosis not present

## 2022-11-14 DIAGNOSIS — S5291XA Unspecified fracture of right forearm, initial encounter for closed fracture: Secondary | ICD-10-CM | POA: Insufficient documentation

## 2022-11-14 DIAGNOSIS — Z5902 Unsheltered homelessness: Secondary | ICD-10-CM | POA: Diagnosis not present

## 2022-11-14 DIAGNOSIS — Z91148 Patient's other noncompliance with medication regimen for other reason: Secondary | ICD-10-CM | POA: Diagnosis not present

## 2022-11-14 DIAGNOSIS — Z9152 Personal history of nonsuicidal self-harm: Secondary | ICD-10-CM

## 2022-11-14 DIAGNOSIS — Z888 Allergy status to other drugs, medicaments and biological substances status: Secondary | ICD-10-CM | POA: Diagnosis not present

## 2022-11-14 DIAGNOSIS — S52302A Unspecified fracture of shaft of left radius, initial encounter for closed fracture: Principal | ICD-10-CM | POA: Diagnosis present

## 2022-11-14 DIAGNOSIS — S52202A Unspecified fracture of shaft of left ulna, initial encounter for closed fracture: Secondary | ICD-10-CM | POA: Diagnosis present

## 2022-11-14 DIAGNOSIS — F4310 Post-traumatic stress disorder, unspecified: Secondary | ICD-10-CM | POA: Diagnosis present

## 2022-11-14 LAB — BASIC METABOLIC PANEL
Anion gap: 10 (ref 5–15)
BUN: 5 mg/dL — ABNORMAL LOW (ref 6–20)
CO2: 22 mmol/L (ref 22–32)
Calcium: 9 mg/dL (ref 8.9–10.3)
Chloride: 106 mmol/L (ref 98–111)
Creatinine, Ser: 0.74 mg/dL (ref 0.61–1.24)
GFR, Estimated: >60 mL/min (ref >60)
Glucose, Bld: 86 mg/dL (ref 70–99)
Potassium: 4.1 mmol/L (ref 3.5–5.1)
Sodium: 138 mmol/L (ref 135–145)

## 2022-11-14 LAB — CBC WITH DIFFERENTIAL/PLATELET
Abs Immature Granulocytes: 0.01 10*3/uL (ref 0.00–0.07)
Basophils Absolute: 0.1 10*3/uL (ref 0.0–0.1)
Basophils Relative: 1 %
Eosinophils Absolute: 0.2 10*3/uL (ref 0.0–0.5)
Eosinophils Relative: 2 %
HCT: 43.5 % (ref 39.0–52.0)
Hemoglobin: 14.4 g/dL (ref 13.0–17.0)
Immature Granulocytes: 0 %
Lymphocytes Relative: 21 %
Lymphs Abs: 2.4 10*3/uL (ref 0.7–4.0)
MCH: 31.4 pg (ref 26.0–34.0)
MCHC: 33.1 g/dL (ref 30.0–36.0)
MCV: 94.8 fL (ref 80.0–100.0)
Monocytes Absolute: 0.9 10*3/uL (ref 0.1–1.0)
Monocytes Relative: 8 %
Neutro Abs: 7.6 10*3/uL (ref 1.7–7.7)
Neutrophils Relative %: 68 %
Platelets: 299 10*3/uL (ref 150–400)
RBC: 4.59 MIL/uL (ref 4.22–5.81)
RDW: 13.3 % (ref 11.5–15.5)
WBC: 11 10*3/uL — ABNORMAL HIGH (ref 4.0–10.5)
nRBC: 0 % (ref 0.0–0.2)

## 2022-11-14 LAB — ETHANOL: Alcohol, Ethyl (B): 68 mg/dL — ABNORMAL HIGH (ref <10)

## 2022-11-14 LAB — PROTIME-INR
INR: 1 (ref 0.8–1.2)
Prothrombin Time: 13.8 seconds (ref 11.4–15.2)

## 2022-11-14 LAB — HIV ANTIBODY (ROUTINE TESTING W REFLEX): HIV Screen 4th Generation wRfx: NONREACTIVE

## 2022-11-14 LAB — APTT: aPTT: 25 seconds (ref 24–36)

## 2022-11-14 MED ORDER — SODIUM CHLORIDE 0.9 % IV SOLN: INTRAVENOUS | Status: DC

## 2022-11-14 MED ORDER — FOLIC ACID 1 MG PO TABS
1.0000 mg | ORAL_TABLET | Freq: Every day | ORAL | Status: DC
  Administered 2022-11-14 – 2022-11-16 (×3): 1 mg via ORAL
  Filled 2022-11-14 (×3): qty 1

## 2022-11-14 MED ORDER — ACETAMINOPHEN 650 MG RE SUPP: 650.0000 mg | Freq: Four times a day (QID) | RECTAL | Status: DC | PRN

## 2022-11-14 MED ORDER — MORPHINE SULFATE (PF) 2 MG/ML IV SOLN: 2.0000 mg | INTRAVENOUS | Status: DC | PRN

## 2022-11-14 MED ORDER — ONDANSETRON HCL 4 MG/2ML IJ SOLN: 4.0000 mg | Freq: Four times a day (QID) | INTRAMUSCULAR | Status: DC | PRN

## 2022-11-14 MED ORDER — ADULT MULTIVITAMIN W/MINERALS CH
1.0000 | ORAL_TABLET | Freq: Every day | ORAL | Status: DC
  Administered 2022-11-14 – 2022-11-16 (×3): 1 via ORAL
  Filled 2022-11-14 (×3): qty 1

## 2022-11-14 MED ORDER — LORAZEPAM 1 MG PO TABS
0.0000 mg | ORAL_TABLET | Freq: Two times a day (BID) | ORAL | Status: DC
  Administered 2022-11-16: 2 mg via ORAL
  Filled 2022-11-14: qty 2

## 2022-11-14 MED ORDER — OXYCODONE HCL 5 MG PO TABS
5.0000 mg | ORAL_TABLET | ORAL | Status: DC | PRN
  Administered 2022-11-14 – 2022-11-16 (×3): 10 mg via ORAL
  Filled 2022-11-14 (×5): qty 2

## 2022-11-14 MED ORDER — LORAZEPAM 1 MG PO TABS: 1.0000 mg | ORAL_TABLET | ORAL | Status: DC | PRN

## 2022-11-14 MED ORDER — ACETAMINOPHEN 325 MG PO TABS
650.0000 mg | ORAL_TABLET | Freq: Four times a day (QID) | ORAL | Status: DC | PRN
  Administered 2022-11-16: 650 mg via ORAL
  Filled 2022-11-14 (×2): qty 2

## 2022-11-14 MED ORDER — ONDANSETRON HCL 4 MG PO TABS: 4.0000 mg | ORAL_TABLET | Freq: Four times a day (QID) | ORAL | Status: DC | PRN

## 2022-11-14 MED ORDER — PANTOPRAZOLE SODIUM 40 MG PO TBEC
40.0000 mg | DELAYED_RELEASE_TABLET | Freq: Every day | ORAL | Status: DC
  Administered 2022-11-14 – 2022-11-16 (×3): 40 mg via ORAL
  Filled 2022-11-14 (×3): qty 1

## 2022-11-14 MED ORDER — LORAZEPAM 0.5 MG PO TABS: 0.5000 mg | ORAL_TABLET | ORAL | Status: DC | PRN

## 2022-11-14 MED ORDER — KETOROLAC TROMETHAMINE 15 MG/ML IJ SOLN
15.0000 mg | Freq: Four times a day (QID) | INTRAMUSCULAR | Status: DC
  Administered 2022-11-14: 15 mg via INTRAVENOUS
  Filled 2022-11-14 (×3): qty 1

## 2022-11-14 MED ORDER — THIAMINE HCL 100 MG/ML IJ SOLN: 100.0000 mg | Freq: Every day | INTRAMUSCULAR | Status: DC

## 2022-11-14 MED ORDER — THIAMINE MONONITRATE 100 MG PO TABS
100.0000 mg | ORAL_TABLET | Freq: Every day | ORAL | Status: DC
  Administered 2022-11-14 – 2022-11-16 (×3): 100 mg via ORAL
  Filled 2022-11-14 (×3): qty 1

## 2022-11-14 MED ORDER — SODIUM CHLORIDE 0.9% FLUSH
3.0000 mL | Freq: Two times a day (BID) | INTRAVENOUS | Status: DC
  Administered 2022-11-14 – 2022-11-16 (×4): 3 mL via INTRAVENOUS

## 2022-11-14 MED ORDER — LORAZEPAM 1 MG PO TABS: 0.0000 mg | ORAL_TABLET | Freq: Four times a day (QID) | ORAL | Status: AC
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
## 2022-11-14 NOTE — Plan of Care (Signed)

## 2022-11-14 NOTE — Plan of Care (Signed)
Problem: Activity: Goal: Risk for activity intolerance will decrease Outcome: Progressing   Problem: Pain Managment: Goal: General experience of comfort will improve Outcome: Progressing
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
## 2022-11-14 NOTE — Progress Notes (Signed)
Orthopedic Tech Progress Note Patient Details:  Shane Allen Sep 24, 1985 [id number]  Patient wanted food but PA/MD stated he was having surgery later this morning   Ortho Devices Type of Ortho Device: Sugartong splint, Cotton web roll, Ace wrap Ortho Device/Splint Location: LUE Ortho Device/Splint Interventions: Ordered, Application, Adjustment   Post Interventions Patient Tolerated: Fair Instructions Provided: Care of device  [name] 11/14/2022, 5:43 AM

## 2022-11-14 NOTE — Progress Notes (Signed)
Orthopedic Tech Progress Note Patient Details:  Shane Allen 07-15-1985 [id number]  Ortho Devices Type of Ortho Device: Arm sling Ortho Device/Splint Location: LUE Ortho Device/Splint Interventions: Application   Post Interventions Patient Tolerated: Well Instructions Provided: Care of device  [name] [name] 11/14/2022, 4:09 PM
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
## 2022-11-15 ENCOUNTER — Encounter (HOSPITAL_COMMUNITY)
Admission: EM | Disposition: A | Discharge status: Home / Self Care | Payer: Self-pay | Source: Home / Self Care | Attending: Internal Medicine

## 2022-11-15 ENCOUNTER — Inpatient Hospital Stay (HOSPITAL_COMMUNITY): Payer: MEDICAID

## 2022-11-15 ENCOUNTER — Other Ambulatory Visit: Payer: Self-pay

## 2022-11-15 ENCOUNTER — Encounter (HOSPITAL_COMMUNITY): Payer: Self-pay | Admitting: Internal Medicine

## 2022-11-15 DIAGNOSIS — S52302A Unspecified fracture of shaft of left radius, initial encounter for closed fracture: Principal | ICD-10-CM

## 2022-11-15 DIAGNOSIS — S52202A Unspecified fracture of shaft of left ulna, initial encounter for closed fracture: Secondary | ICD-10-CM

## 2022-11-15 HISTORY — PX: ORIF RADIAL FRACTURE: SHX5113

## 2022-11-15 LAB — RAPID URINE DRUG SCREEN, HOSP PERFORMED
Amphetamines: NOT DETECTED
Barbiturates: NOT DETECTED
Benzodiazepines: POSITIVE — AB
Cocaine: NOT DETECTED
Opiates: NOT DETECTED
Tetrahydrocannabinol: NOT DETECTED

## 2022-11-15 LAB — CBC
HCT: 37.5 % — ABNORMAL LOW (ref 39.0–52.0)
Hemoglobin: 12.5 g/dL — ABNORMAL LOW (ref 13.0–17.0)
MCH: 33 pg (ref 26.0–34.0)
MCHC: 33.3 g/dL (ref 30.0–36.0)
MCV: 98.9 fL (ref 80.0–100.0)
Platelets: 267 10*3/uL (ref 150–400)
RBC: 3.79 MIL/uL — ABNORMAL LOW (ref 4.22–5.81)
RDW: 13.8 % (ref 11.5–15.5)
WBC: 7.4 10*3/uL (ref 4.0–10.5)
nRBC: 0 % (ref 0.0–0.2)

## 2022-11-15 LAB — BASIC METABOLIC PANEL
Anion gap: 10 (ref 5–15)
BUN: 11 mg/dL (ref 6–20)
CO2: 25 mmol/L (ref 22–32)
Calcium: 9.2 mg/dL (ref 8.9–10.3)
Chloride: 105 mmol/L (ref 98–111)
Creatinine, Ser: 0.83 mg/dL (ref 0.61–1.24)
GFR, Estimated: >60 mL/min (ref >60)
Glucose, Bld: 60 mg/dL — ABNORMAL LOW (ref 70–99)
Potassium: 4.9 mmol/L (ref 3.5–5.1)
Sodium: 140 mmol/L (ref 135–145)

## 2022-11-15 SURGERY — OPEN REDUCTION INTERNAL FIXATION (ORIF) RADIAL FRACTURE
Anesthesia: Regional | Laterality: Right

## 2022-11-15 MED ORDER — DEXAMETHASONE SODIUM PHOSPHATE 10 MG/ML IJ SOLN
INTRAMUSCULAR | Status: DC | PRN
  Administered 2022-11-15: 6 mg via INTRAVENOUS

## 2022-11-15 MED ORDER — LIDOCAINE 2% (20 MG/ML) 5 ML SYRINGE
INTRAMUSCULAR | Status: DC | PRN
  Administered 2022-11-15: 60 mg via INTRAVENOUS

## 2022-11-15 MED ORDER — ORAL CARE MOUTH RINSE: 15.0000 mL | Freq: Once | OROMUCOSAL | Status: AC

## 2022-11-15 MED ORDER — ROCURONIUM BROMIDE 10 MG/ML (PF) SYRINGE
PREFILLED_SYRINGE | INTRAVENOUS | Status: AC
  Filled 2022-11-15: qty 10

## 2022-11-15 MED ORDER — 0.9 % SODIUM CHLORIDE (POUR BTL) OPTIME
TOPICAL | Status: DC | PRN
Start: 2022-11-15 — End: 2022-11-15
  Administered 2022-11-15: 1000 mL

## 2022-11-15 MED ORDER — ONDANSETRON HCL 4 MG/2ML IJ SOLN
INTRAMUSCULAR | Status: AC
  Filled 2022-11-15: qty 2

## 2022-11-15 MED ORDER — MIDAZOLAM HCL 2 MG/2ML IJ SOLN
INTRAMUSCULAR | Status: DC | PRN
  Administered 2022-11-15: 2 mg via INTRAVENOUS

## 2022-11-15 MED ORDER — CEFAZOLIN SODIUM 1 G IJ SOLR
INTRAMUSCULAR | Status: AC
  Filled 2022-11-15: qty 10

## 2022-11-15 MED ORDER — FENTANYL CITRATE (PF) 100 MCG/2ML IJ SOLN: 25.0000 ug | INTRAMUSCULAR | Status: DC | PRN

## 2022-11-15 MED ORDER — PHENYLEPHRINE 80 MCG/ML (10ML) SYRINGE FOR IV PUSH (FOR BLOOD PRESSURE SUPPORT)
PREFILLED_SYRINGE | INTRAVENOUS | Status: DC | PRN
  Administered 2022-11-15: 160 ug via INTRAVENOUS

## 2022-11-15 MED ORDER — OXYCODONE HCL 5 MG PO TABS: 5.0000 mg | ORAL_TABLET | Freq: Once | ORAL | Status: DC | PRN

## 2022-11-15 MED ORDER — OXYCODONE HCL 5 MG/5ML PO SOLN: 5.0000 mg | Freq: Once | ORAL | Status: DC | PRN

## 2022-11-15 MED ORDER — MIDAZOLAM HCL 2 MG/2ML IJ SOLN
INTRAMUSCULAR | Status: AC
  Filled 2022-11-15: qty 2

## 2022-11-15 MED ORDER — DEXAMETHASONE SODIUM PHOSPHATE 10 MG/ML IJ SOLN
INTRAMUSCULAR | Status: AC
  Filled 2022-11-15: qty 1

## 2022-11-15 MED ORDER — GLYCOPYRROLATE 0.2 MG/ML IJ SOLN
INTRAMUSCULAR | Status: DC | PRN
  Administered 2022-11-15: .2 mg via INTRAVENOUS

## 2022-11-15 MED ORDER — EPHEDRINE SULFATE-NACL 50-0.9 MG/10ML-% IV SOSY
PREFILLED_SYRINGE | INTRAVENOUS | Status: DC | PRN
  Administered 2022-11-15 (×2): 10 mg via INTRAVENOUS

## 2022-11-15 MED ORDER — FENTANYL CITRATE (PF) 250 MCG/5ML IJ SOLN
INTRAMUSCULAR | Status: AC
  Filled 2022-11-15: qty 5

## 2022-11-15 MED ORDER — ONDANSETRON HCL 4 MG/2ML IJ SOLN: 4.0000 mg | Freq: Four times a day (QID) | INTRAMUSCULAR | Status: DC | PRN

## 2022-11-15 MED ORDER — BUPIVACAINE HCL (PF) 0.5 % IJ SOLN
INTRAMUSCULAR | Status: DC | PRN
  Administered 2022-11-15: 30 mL via PERINEURAL

## 2022-11-15 MED ORDER — KETOROLAC TROMETHAMINE 30 MG/ML IJ SOLN
INTRAMUSCULAR | Status: AC
  Filled 2022-11-15: qty 1

## 2022-11-15 MED ORDER — LACTATED RINGERS IV SOLN: INTRAVENOUS | Status: DC

## 2022-11-15 MED ORDER — LIDOCAINE 2% (20 MG/ML) 5 ML SYRINGE
INTRAMUSCULAR | Status: AC
  Filled 2022-11-15: qty 5

## 2022-11-15 MED ORDER — NICOTINE POLACRILEX 2 MG MT GUM
2.0000 mg | CHEWING_GUM | OROMUCOSAL | Status: DC | PRN
  Administered 2022-11-15 – 2022-11-16 (×6): 2 mg via ORAL
  Filled 2022-11-15 (×8): qty 1

## 2022-11-15 MED ORDER — PROPOFOL 10 MG/ML IV BOLUS
INTRAVENOUS | Status: AC
  Filled 2022-11-15: qty 20

## 2022-11-15 MED ORDER — CHLORHEXIDINE GLUCONATE 0.12 % MT SOLN
15.0000 mL | Freq: Once | OROMUCOSAL | Status: AC
  Administered 2022-11-15: 15 mL via OROMUCOSAL

## 2022-11-15 MED ORDER — KETOROLAC TROMETHAMINE 15 MG/ML IJ SOLN
15.0000 mg | Freq: Four times a day (QID) | INTRAMUSCULAR | Status: DC | PRN
  Administered 2022-11-16: 15 mg via INTRAVENOUS
  Filled 2022-11-15: qty 1

## 2022-11-15 MED ORDER — FENTANYL CITRATE (PF) 250 MCG/5ML IJ SOLN
INTRAMUSCULAR | Status: DC | PRN
  Administered 2022-11-15: 50 ug via INTRAVENOUS
  Administered 2022-11-15: 100 ug via INTRAVENOUS

## 2022-11-15 MED ORDER — PROPOFOL 10 MG/ML IV BOLUS
INTRAVENOUS | Status: DC | PRN
  Administered 2022-11-15: 200 mg via INTRAVENOUS

## 2022-11-15 MED ORDER — ONDANSETRON HCL 4 MG/2ML IJ SOLN
INTRAMUSCULAR | Status: DC | PRN
  Administered 2022-11-15: 4 mg via INTRAVENOUS

## 2022-11-15 MED ORDER — CEFAZOLIN SODIUM-DEXTROSE 2-3 GM-%(50ML) IV SOLR
INTRAVENOUS | Status: DC | PRN
Start: 2022-11-15 — End: 2022-11-15
  Administered 2022-11-15: 2 g via INTRAVENOUS

## 2022-11-15 MED ORDER — CEFAZOLIN SODIUM-DEXTROSE 2-4 GM/100ML-% IV SOLN: 2.0000 g | Freq: Once | INTRAVENOUS | Status: DC

## 2022-11-15 SURGICAL SUPPLY — 59 items
ADH SKN CLS LQ APL DERMABOND (GAUZE/BANDAGES/DRESSINGS) ×1
BAG COUNTER SPONGE SURGICOUNT (BAG) ×1 IMPLANT
BAG SPNG CNTER NS LX DISP (BAG) ×1
BIT DRILL QC 2.5MM SHRT EVO SM (DRILL) IMPLANT
BNDG CMPR 9X4 STRL LF SNTH (GAUZE/BANDAGES/DRESSINGS) ×1
BNDG COHESIVE 4X5 TAN STRL (GAUZE/BANDAGES/DRESSINGS) ×1 IMPLANT
BNDG ESMARK 4X9 LF (GAUZE/BANDAGES/DRESSINGS) ×1 IMPLANT
CLSR STERI-STRIP ANTIMIC 1/2X4 (GAUZE/BANDAGES/DRESSINGS) IMPLANT
COVER SURGICAL LIGHT HANDLE (MISCELLANEOUS) ×2 IMPLANT
CUFF TOURN SGL QUICK 18X4 (TOURNIQUET CUFF) ×1 IMPLANT
DERMABOND ADVANCED .7 DNX6 (GAUZE/BANDAGES/DRESSINGS) IMPLANT
DRAPE U-SHAPE 47X51 STRL (DRAPES) ×1 IMPLANT
DRILL QC 2.5MM SHORT EVOS SM (DRILL) ×1
DURAPREP 26ML APPLICATOR (WOUND CARE) ×1 IMPLANT
ELECT REM PT RETURN 9FT ADLT (ELECTROSURGICAL) ×1
ELECTRODE REM PT RTRN 9FT ADLT (ELECTROSURGICAL) ×1 IMPLANT
GAUZE 4X4 16PLY ~~LOC~~+RFID DBL (SPONGE) ×1 IMPLANT
GAUZE SPONGE 4X4 12PLY STRL (GAUZE/BANDAGES/DRESSINGS) IMPLANT
GLOVE BIO SURGEON STRL SZ 6 (GLOVE) ×1 IMPLANT
GLOVE BIOGEL PI IND STRL 6.5 (GLOVE) ×1 IMPLANT
GLOVE BIOGEL PI IND STRL 8 (GLOVE) ×2 IMPLANT
GLOVE INDICATOR 8.0 STRL GRN (GLOVE) ×1 IMPLANT
GOWN STRL REUS W/ TWL XL LVL3 (GOWN DISPOSABLE) ×1 IMPLANT
GOWN STRL REUS W/TWL XL LVL3 (GOWN DISPOSABLE) ×1
KIT BASIN OR (CUSTOM PROCEDURE TRAY) ×1 IMPLANT
KIT TURNOVER KIT B (KITS) ×1 IMPLANT
MANIFOLD NEPTUNE II (INSTRUMENTS) ×1 IMPLANT
NS IRRIG 1000ML POUR BTL (IV SOLUTION) ×1 IMPLANT
PACK ORTHO EXTREMITY (CUSTOM PROCEDURE TRAY) ×1 IMPLANT
PAD ABD 8X10 STRL (GAUZE/BANDAGES/DRESSINGS) IMPLANT
PAD ARMBOARD 7.5X6 YLW CONV (MISCELLANEOUS) ×2 IMPLANT
PAD CAST 4YDX4 CTTN HI CHSV (CAST SUPPLIES) IMPLANT
PADDING CAST COTTON 4X4 STRL (CAST SUPPLIES) ×1
PADDING CAST COTTON 6X4 STRL (CAST SUPPLIES) IMPLANT
PLATE COMPR EVOS 3.5X90 7H (Plate) IMPLANT
PLATE LOCK EVOS 3.5X81 (Plate) IMPLANT
SCREW CORT 3.5X14 ST EVOS (Screw) IMPLANT
SCREW CORT 3.5X15 ST EVOS (Screw) IMPLANT
SCREW CORT 3.5X16 ST EVOS (Screw) IMPLANT
SCREW CORT 3.5X17 ST EVOS (Screw) IMPLANT
SCREW CORT 3.5X19 ST EVOS (Screw) IMPLANT
SCREW CORT 3.5X20 ST EVOS (Screw) IMPLANT
SCREW CORTEX 3.5X18 EVOS (Screw) IMPLANT
SPONGE T-LAP 18X18 ~~LOC~~+RFID (SPONGE) ×2 IMPLANT
STRIP CLOSURE SKIN 1/2X4 (GAUZE/BANDAGES/DRESSINGS) ×1 IMPLANT
SUCTION TUBE FRAZIER 10FR DISP (SUCTIONS) ×1 IMPLANT
SUT MNCRL+ AB 3-0 CT1 36 (SUTURE) IMPLANT
SUT PROLENE 3 0 PS 1 (SUTURE) ×1 IMPLANT
SUT VIC AB 0 CT1 27 (SUTURE) ×2
SUT VIC AB 0 CT1 27XBRD ANBCTR (SUTURE) IMPLANT
SUT VIC AB 2-0 CT1 27 (SUTURE) ×2
SUT VIC AB 2-0 CT1 TAPERPNT 27 (SUTURE) IMPLANT
SUT VIC AB 3-0 X1 27 (SUTURE) ×1 IMPLANT
SYR BULB EAR ULCER 3OZ GRN STR (SYRINGE) ×1 IMPLANT
TOWEL GREEN STERILE (TOWEL DISPOSABLE) ×1 IMPLANT
TOWEL GREEN STERILE FF (TOWEL DISPOSABLE) ×1 IMPLANT
TUBE CONNECTING 12X1/4 (SUCTIONS) ×1 IMPLANT
WATER STERILE IRR 1000ML POUR (IV SOLUTION) ×1 IMPLANT
YANKAUER SUCT BULB TIP NO VENT (SUCTIONS) ×1 IMPLANT

## 2022-11-15 NOTE — Anesthesia Postprocedure Evaluation (Signed)
Anesthesia Post Note  Patient: Shane Allen  Procedure(s) Performed: OPEN REDUCTION INTERNAL FIXATION (ORIF) ULNAR FRACTURE (Right)     Patient location during evaluation: PACU Anesthesia Type: Regional and General Level of consciousness: awake and alert Pain management: pain level controlled Vital Signs Assessment: post-procedure vital signs reviewed and stable Respiratory status: spontaneous breathing, nonlabored ventilation, respiratory function stable and patient connected to nasal cannula oxygen Cardiovascular status: blood pressure returned to baseline and stable Postop Assessment: no apparent nausea or vomiting Anesthetic complications: no   No notable events documented.  Last Vitals:  Vitals:   11/15/22 0930 11/15/22 1013  BP: 116/84 118/81  Pulse: 85 64  Resp: 12 16  Temp:  36.4 C  SpO2: 100% 100%    Last Pain:  Vitals:   11/15/22 1013  TempSrc: Oral  PainSc:                  Dexton Zwilling S

## 2022-11-15 NOTE — Plan of Care (Signed)
  Problem: Education: Goal: Knowledge of General Education information will improve Description: Including pain rating scale, medication(s)/side effects and non-pharmacologic comfort measures 11/15/2022 1522 by Aretta Nip, RN Outcome: Progressing 11/15/2022 1522 by Aretta Nip, RN Outcome: Not Progressing   Problem: Health Behavior/Discharge Planning: Goal: Ability to manage health-related needs will improve 11/15/2022 1522 by Aretta Nip, RN Outcome: Progressing 11/15/2022 1522 by Aretta Nip, RN Outcome: Not Progressing   Problem: Clinical Measurements: Goal: Ability to maintain clinical measurements within normal limits will improve 11/15/2022 1522 by Aretta Nip, RN Outcome: Progressing 11/15/2022 1522 by Aretta Nip, RN Outcome: Not Progressing Goal: Will remain free from infection 11/15/2022 1522 by Aretta Nip, RN Outcome: Progressing 11/15/2022 1522 by Aretta Nip, RN Outcome: Not Progressing Goal: Diagnostic test results will improve 11/15/2022 1522 by Aretta Nip, RN Outcome: Progressing 11/15/2022 1522 by Aretta Nip, RN Outcome: Not Progressing Goal: Respiratory complications will improve 11/15/2022 1522 by Aretta Nip, RN Outcome: Progressing 11/15/2022 1522 by Aretta Nip, RN Outcome: Not Progressing Goal: Cardiovascular complication will be avoided 11/15/2022 1522 by Aretta Nip, RN Outcome: Progressing 11/15/2022 1522 by Aretta Nip, RN Outcome: Not Progressing   Problem: Activity: Goal: Risk for activity intolerance will decrease 11/15/2022 1522 by Aretta Nip, RN Outcome: Progressing 11/15/2022 1522 by Aretta Nip, RN Outcome: Not Progressing   Problem: Nutrition: Goal: Adequate nutrition will be maintained 11/15/2022 1522 by Aretta Nip, RN Outcome: Progressing 11/15/2022 1522 by Aretta Nip, RN Outcome: Not Progressing   Problem: Coping: Goal: Level of anxiety will decrease 11/15/2022  1522 by Aretta Nip, RN Outcome: Progressing 11/15/2022 1522 by Aretta Nip, RN Outcome: Not Progressing   Problem: Elimination: Goal: Will not experience complications related to bowel motility 11/15/2022 1522 by Aretta Nip, RN Outcome: Progressing 11/15/2022 1522 by Aretta Nip, RN Outcome: Not Progressing Goal: Will not experience complications related to urinary retention 11/15/2022 1522 by Aretta Nip, RN Outcome: Progressing 11/15/2022 1522 by Aretta Nip, RN Outcome: Not Progressing   Problem: Pain Managment: Goal: General experience of comfort will improve 11/15/2022 1522 by Aretta Nip, RN Outcome: Progressing 11/15/2022 1522 by Aretta Nip, RN Outcome: Not Progressing   Problem: Safety: Goal: Ability to remain free from injury will improve 11/15/2022 1522 by Aretta Nip, RN Outcome: Progressing 11/15/2022 1522 by Aretta Nip, RN Outcome: Not Progressing   Problem: Skin Integrity: Goal: Risk for impaired skin integrity will decrease 11/15/2022 1522 by Aretta Nip, RN Outcome: Progressing 11/15/2022 1522 by Aretta Nip, RN Outcome: Not Progressing

## 2022-11-15 NOTE — Brief Op Note (Signed)
   Brief Op Note  Date of Surgery: 11/15/2022  Preoperative Diagnosis: RIGHT ULNAR FRACTURE  Postoperative Diagnosis: same  Procedure: Procedure(s): OPEN REDUCTION INTERNAL FIXATION (ORIF) ULNAR FRACTURE  Implants: Implant Name Type Inv. Item Serial No. Manufacturer Lot No. LRB No. Used Action  PLATE LOCK EVOS 3.5X81 - [id number] Plate PLATE LOCK EVOS 3.5X81  [employer]  Right 1 Implanted  SCREW CORT 3.5X16 ST EVOS - [id number] Screw SCREW CORT 3.5X16 ST EVOS  [employer]  Right 2 Implanted  SCREW CORT 3.5X17 ST EVOS - [id number] Screw SCREW CORT 3.5X17 ST EVOS  [employer]  Right 1 Implanted  SCREW CORTEX 3.5X18 EVOS - [id number] Screw SCREW CORTEX 3.5X18 EVOS  [employer]  Right 1 Implanted  SCREW CORT 3.5X19 ST EVOS - [id number] Screw SCREW CORT 3.5X19 ST EVOS  [employer]  Right 1 Implanted  SCREW CORT 3.5X20 ST EVOS - [id number] Screw SCREW CORT 3.5X20 ST EVOS  [employer]  Right 1 Implanted  SCREW CORT 3.5X20 ST EVOS - [id number] Screw SCREW CORT 3.5X20 ST EVOS  [employer]  Right 1 Implanted  7H Compression Plate      Right 1 Implanted  SCREW CORT 3.5X16 ST EVOS - [id number] Screw SCREW CORT 3.5X16 ST EVOS  [employer]  Right 1 Implanted  SCREW CORT 3.5X15 ST EVOS - [id number] Screw SCREW CORT 3.5X15 ST EVOS  [employer]  Right 4 Implanted  SCREW CORT 3.5X14 ST EVOS - [id number] Screw SCREW CORT 3.5X14 ST EVOS  [employer]  Right 1 Implanted    Surgeons: Surgeon(s): Huel Cote, MD  Anesthesia: General    Estimated Blood Loss: See anesthesia record  Complications: None  Condition to PACU: Stable  Benancio Deeds, MD 11/15/2022 9:01 AM

## 2022-11-15 NOTE — Progress Notes (Signed)
0730 patient in surgery received report from night RN 1000 patient arrived back from surgery alert 4 on room air able to make all needs known ace bandage to left arm from fingers to upper arm. Patient has no feeling in the left arm at this time 1200 patient still has no feeling in left arm 1300 patient still has no feeling in left arm 1400 patient still has no feeling in left arm 1500 patient still has no feeling in left arm

## 2022-11-15 NOTE — Anesthesia Preprocedure Evaluation (Signed)
Anesthesia Evaluation  Patient identified by MRN, date of birth, ID band Patient awake    Reviewed: Allergy & Precautions, H&P , NPO status , Patient's Chart, lab work & pertinent test results  Airway Mallampati: II   Neck ROM: full    Dental   Pulmonary Patient abstained from smoking., former smoker   breath sounds clear to auscultation       Cardiovascular negative cardio ROS  Rhythm:regular Rate:Normal     Neuro/Psych  PSYCHIATRIC DISORDERS  Depression  Schizophrenia     GI/Hepatic   Endo/Other    Renal/GU      Musculoskeletal   Abdominal   Peds  Hematology   Anesthesia Other Findings   Reproductive/Obstetrics                             Anesthesia Physical Anesthesia Plan  ASA: 2  Anesthesia Plan: General   Post-op Pain Management: Regional block*   Induction: Intravenous  PONV Risk Score and Plan: 2 and Ondansetron, Dexamethasone, Midazolam and Treatment may vary due to age or medical condition  Airway Management Planned: LMA  Additional Equipment:   Intra-op Plan:   Post-operative Plan: Extubation in OR  Informed Consent: I have reviewed the patients History and Physical, chart, labs and discussed the procedure including the risks, benefits and alternatives for the proposed anesthesia with the patient or authorized representative who has indicated his/her understanding and acceptance.     Dental advisory given  Plan Discussed with: CRNA, Anesthesiologist and Surgeon  Anesthesia Plan Comments:        Anesthesia Quick Evaluation

## 2022-11-15 NOTE — Plan of Care (Signed)

## 2022-11-15 NOTE — Anesthesia Procedure Notes (Signed)
Procedure Name: LMA Insertion Date/Time: 11/15/2022 7:41 AM  Performed by: April Holding, CRNAPre-anesthesia Checklist: Patient identified, Emergency Drugs available, Suction available and Patient being monitored Patient Re-evaluated:Patient Re-evaluated prior to induction Oxygen Delivery Method: Circle System Utilized Preoxygenation: Pre-oxygenation with 100% oxygen Induction Type: IV induction Ventilation: Mask ventilation without difficulty LMA: LMA inserted LMA Size: 4.0 and 4.5 Number of attempts: 1 Airway Equipment and Method: Bite block Placement Confirmation: positive ETCO2 Tube secured with: Tape Dental Injury: Teeth and Oropharynx as per pre-operative assessment

## 2022-11-15 NOTE — Op Note (Signed)
Date of Surgery: 11/15/2022  INDICATIONS: [name] is a 37 y.o.-year-old male with a left both bone forearm fracture.  The risk and benefits of the procedure were discussed in detail and documented in the pre-operative evaluation.   PREOPERATIVE DIAGNOSIS: 1.  Left both bone forearm fracture  POSTOPERATIVE DIAGNOSIS: Same.  PROCEDURE: 1.  Open reduction internal fixation of the radius and ulna  SURGEON: Benancio Deeds MD  ASSISTANT: Kerby Less, ATC  ANESTHESIA:  general  IV FLUIDS AND URINE: See anesthesia record.  ANTIBIOTICS: Ancef  ESTIMATED BLOOD LOSS: 25 mL.  IMPLANTS:  Implant Name Type Inv. Item Serial No. Manufacturer Lot No. LRB No. Used Action  PLATE LOCK EVOS 3.5X81 - [id number] Plate PLATE LOCK EVOS 3.5X81  [employer]  Right 1 Implanted  SCREW CORT 3.5X16 ST EVOS - [id number] Screw SCREW CORT 3.5X16 ST EVOS  [employer]  Right 2 Implanted  SCREW CORT 3.5X17 ST EVOS - [id number] Screw SCREW CORT 3.5X17 ST EVOS  [employer]  Right 1 Implanted  SCREW CORTEX 3.5X18 EVOS - [id number] Screw SCREW CORTEX 3.5X18 EVOS  [employer]  Right 1 Implanted  SCREW CORT 3.5X19 ST EVOS - [id number] Screw SCREW CORT 3.5X19 ST EVOS  [employer]  Right 1 Implanted  SCREW CORT 3.5X20 ST EVOS - [id number] Screw SCREW CORT 3.5X20 ST EVOS  [employer]  Right 1 Implanted  SCREW CORT 3.5X20 ST EVOS - [id number] Screw SCREW CORT 3.5X20 ST EVOS  [employer]  Right 1 Implanted  7H Compression Plate      Right 1 Implanted  SCREW CORT 3.5X16 ST EVOS - [id number] Screw SCREW CORT 3.5X16 ST EVOS  [employer]  Right 1 Implanted  SCREW CORT 3.5X15 ST EVOS - [id number] Screw SCREW CORT 3.5X15 ST EVOS  [employer]  Right 4 Implanted  SCREW CORT 3.5X14 ST EVOS - [id number] Screw SCREW CORT 3.5X14 ST EVOS  [employer]  Right 1  Implanted    DRAINS: None  CULTURES: None  COMPLICATIONS: none  DESCRIPTION OF PROCEDURE:   The patient was identified in preoperative holding area.  The correct site was marked according to universal protocol.  Antibiotics was administered 1 hour prior to skin incision.  He was subsequently taken back to the operating room.  Anesthesia was induced.  Transferred over the table.  He was placed in supine position with arm on an arm table.  He was prepped and draped in usual sterile fashion.  Final timeout was again performed.  I began with approach to the ulna.  15 blade was used to incise over the ulnar crest through skin.  Electrocautery was used to dissect down to the level of bone through the interval between the FCU and ECU.  Fracture was identified and mobilized.  Fracture hematoma was irrigated.  The fracture was then reduced with traction and this keyed in nicely.  The plate of appropriate length was confirmed on AP and lateral fluoroscopy and this was subsequently placed with 6 cortical screws with 3 on each side.  Anatomic reduction was confirmed.  At this time approach to the volar forearm using a distal Sherilyn Cooter was performed.  15 blade was used to incise through skin.  At this time layer by layer dissection with Metzenbaum scissors was utilized to the fascia and the interval between brachioradialis and FCR.  The radial artery was identified and protected.  The fracture was identified and hematoma was evacuated.  This was again held out to length and keyed in nicely.  Volar plating was then utilized.  Again a plate was selected based on AP and lateral fluoroscopy and 3 screws were placed on the side of the fracture.  Reduction was confirmed to be anatomic on the AP and lateral.  Wounds were thoroughly irrigated and closed in layers of 0 Vicryl 2-0 Vicryl and 3-0 Monocryl.  Dressings were placed with Dermabond Steris gauze and ABD cast padding and Ace wrap.  All counts were correct at the end of  the case.  He was taken to the PACU without complication.    POSTOPERATIVE PLAN: He may begin active range of motion as tolerated on the left arm.  He will defer from lifting on the left arm.  I will plan to see him back in 2 weeks for postop visit.  Benancio Deeds, MD 9:01 AM
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
## 2022-11-15 NOTE — Discharge Instructions (Addendum)
     Discharge Instructions    Attending Surgeon: Huel Cote, MD Office Phone Number: [phone]   Diagnosis and Procedures:    Surgeries Performed: Left forearm fixation  Discharge Plan:    Diet: Resume usual diet. Begin with light or bland foods.  Drink plenty of fluids.  Activity:  No lifting with left arm, you may use this for range of motion as tolerated  GENERAL INSTRUCTIONS: 1.  Keep your surgical site elevated above your heart for at least 5-7 days or longer to prevent swelling. This will improve your comfort and your overall recovery following surgery.     2. Please call Dr. Serena Croissant office at [phone] with questions Monday-Friday during business hours. If no one answers, please leave a message and someone should get back to the patient within 24 hours. For emergencies please call 911 or proceed to the emergency room.   3. Patient to notify surgical team if experiences any of the following: Bowel/Bladder dysfunction, uncontrolled pain, nerve/muscle weakness, incision with increased drainage or redness, nausea/vomiting and Fever greater than 101.0 F.  Be alert for signs of infection including redness, streaking, odor, fever or chills. Be alert for excessive pain or bleeding and notify your surgeon immediately.  WOUND INSTRUCTIONS:   Leave your dressing/cast/splint in place until your post operative visit.  Keep it clean and dry.  Always keep the incision clean and dry until the staples/sutures are removed. If there is no drainage from the incision you should keep it open to air. If there is drainage from the incision you must keep it covered at all times until the drainage stops  Do not soak in a bath tub, hot tub, pool, lake or other body of water until 21 days after your surgery and your incision is completely dry and healed.  If you have removable sutures (or staples) they must be removed 10-14 days (unless otherwise instructed) from the day of your surgery.      1)  Elevate the extremity as much as possible.  2)  Keep the dressing clean and dry.  3)  Please call us if the dressing becomes wet or dirty.  4)  If you are experiencing worsening pain or worsening swelling, please call.

## 2022-11-15 NOTE — Transfer of Care (Signed)
Immediate Anesthesia Transfer of Care Note  Patient: Shane Allen  Procedure(s) Performed: OPEN REDUCTION INTERNAL FIXATION (ORIF) ULNAR FRACTURE (Right)  Patient Location: PACU  Anesthesia Type:General and Regional  Level of Consciousness: awake and alert   Airway & Oxygen Therapy: Patient Spontanous Breathing and Patient connected to nasal cannula oxygen  Post-op Assessment: Report given to RN and Post -op Vital signs reviewed and stable  Post vital signs: Reviewed and stable  Last Vitals:  Vitals Value Taken Time  BP 117/58 11/15/22 0916  Temp    Pulse 51 11/15/22 0918  Resp 17 11/15/22 0918  SpO2 99 % 11/15/22 0918  Vitals shown include unfiled device data.  Last Pain:  Vitals:   11/15/22 0650  TempSrc: Oral  PainSc: 10-Worst pain ever         Complications: No notable events documented.

## 2022-11-15 NOTE — Anesthesia Procedure Notes (Signed)
Anesthesia Regional Block: Supraclavicular block   Pre-Anesthetic Checklist: , timeout performed,  Correct Patient, Correct Site, Correct Laterality,  Correct Procedure, Correct Position, site marked,  Risks and benefits discussed,  Surgical consent,  Pre-op evaluation,  At surgeon's request and post-op pain management  Laterality: Left  Prep: chloraprep       Needles:  Injection technique: Single-shot  Needle Type: Echogenic Stimulator Needle     Needle Length: 5cm  Needle Gauge: 22     Additional Needles:   Procedures:, nerve stimulator,,,,,     Nerve Stimulator or Paresthesia:  Response: biceps flexion, 0.45 mA  Additional Responses:   Narrative:  Start time: 11/15/2022 7:18 AM End time: 11/15/2022 7:26 AM Injection made incrementally with aspirations every 5 mL.  Performed by: Personally  Anesthesiologist: Achille Rich, MD  Additional Notes: Functioning IV was confirmed and monitors were applied.  A 50mm 22ga Arrow echogenic stimulator needle was used. Sterile prep and drape,hand hygiene and sterile gloves were used.  Negative aspiration and negative test dose prior to incremental administration of local anesthetic. The patient tolerated the procedure well.  Ultrasound guidance: relevent anatomy identified, needle position confirmed, local anesthetic spread visualized around nerve(s), vascular puncture avoided.  Image printed for medical record.
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
## 2022-11-16 ENCOUNTER — Other Ambulatory Visit (HOSPITAL_COMMUNITY): Payer: Self-pay

## 2022-11-16 ENCOUNTER — Telehealth (HOSPITAL_BASED_OUTPATIENT_CLINIC_OR_DEPARTMENT_OTHER): Payer: Self-pay

## 2022-11-16 DIAGNOSIS — S52302A Unspecified fracture of shaft of left radius, initial encounter for closed fracture: Secondary | ICD-10-CM | POA: Diagnosis not present

## 2022-11-16 DIAGNOSIS — S52202A Unspecified fracture of shaft of left ulna, initial encounter for closed fracture: Secondary | ICD-10-CM | POA: Diagnosis not present

## 2022-11-16 MED ORDER — METHOCARBAMOL 750 MG PO TABS
750.0000 mg | ORAL_TABLET | Freq: Three times a day (TID) | ORAL | 0 refills | Status: DC | PRN
  Filled 2022-11-16: qty 30, 10d supply, fill #0

## 2022-11-16 MED ORDER — NICOTINE POLACRILEX 2 MG MT GUM
2.0000 mg | CHEWING_GUM | OROMUCOSAL | 0 refills | Status: DC | PRN
Start: 2022-11-16 — End: 2022-11-27
  Filled 2022-11-16: qty 100, fill #0

## 2022-11-16 MED ORDER — FOLIC ACID 1 MG PO TABS
1.0000 mg | ORAL_TABLET | Freq: Every day | ORAL | 0 refills | Status: DC
  Filled 2022-11-16: qty 60, 60d supply, fill #0

## 2022-11-16 MED ORDER — OXYCODONE HCL 5 MG PO TABS
5.0000 mg | ORAL_TABLET | Freq: Four times a day (QID) | ORAL | 0 refills | Status: DC | PRN
  Filled 2022-11-16: qty 15, 4d supply, fill #0

## 2022-11-16 MED ORDER — THIAMINE HCL 100 MG PO TABS
100.0000 mg | ORAL_TABLET | Freq: Every day | ORAL | 0 refills | Status: DC
Start: 2022-11-17 — End: 2023-08-25
  Filled 2022-11-16: qty 60, 60d supply, fill #0

## 2022-11-16 NOTE — Plan of Care (Signed)
  Problem: Education: Goal: Knowledge of General Education information will improve Description: Including pain rating scale, medication(s)/side effects and non-pharmacologic comfort measures 11/16/2022 1117 by Mateo Flow, RN Outcome: Adequate for Discharge 11/16/2022 1116 by Mateo Flow, RN Outcome: Adequate for Discharge   Problem: Health Behavior/Discharge Planning: Goal: Ability to manage health-related needs will improve 11/16/2022 1117 by Mateo Flow, RN Outcome: Adequate for Discharge 11/16/2022 1116 by Mateo Flow, RN Outcome: Adequate for Discharge   Problem: Clinical Measurements: Goal: Ability to maintain clinical measurements within normal limits will improve 11/16/2022 1117 by Mateo Flow, RN Outcome: Adequate for Discharge 11/16/2022 1116 by Mateo Flow, RN Outcome: Adequate for Discharge Goal: Will remain free from infection 11/16/2022 1117 by Mateo Flow, RN Outcome: Adequate for Discharge 11/16/2022 1116 by Mateo Flow, RN Outcome: Adequate for Discharge Goal: Diagnostic test results will improve 11/16/2022 1117 by Mateo Flow, RN Outcome: Adequate for Discharge 11/16/2022 1116 by Mateo Flow, RN Outcome: Adequate for Discharge Goal: Respiratory complications will improve 11/16/2022 1117 by Mateo Flow, RN Outcome: Adequate for Discharge 11/16/2022 1116 by Mateo Flow, RN Outcome: Adequate for Discharge Goal: Cardiovascular complication will be avoided 11/16/2022 1117 by Mateo Flow, RN Outcome: Adequate for Discharge 11/16/2022 1116 by Mateo Flow, RN Outcome: Adequate for Discharge   Problem: Activity: Goal: Risk for activity intolerance will decrease 11/16/2022 1117 by Mateo Flow, RN Outcome: Adequate for Discharge 11/16/2022 1116 by Mateo Flow, RN Outcome: Adequate for Discharge   Problem: Nutrition: Goal: Adequate nutrition will be maintained 11/16/2022 1117 by Mateo Flow,  RN Outcome: Adequate for Discharge 11/16/2022 1116 by Mateo Flow, RN Outcome: Adequate for Discharge   Problem: Coping: Goal: Level of anxiety will decrease 11/16/2022 1117 by Mateo Flow, RN Outcome: Adequate for Discharge 11/16/2022 1116 by Mateo Flow, RN Outcome: Adequate for Discharge   Problem: Elimination: Goal: Will not experience complications related to bowel motility 11/16/2022 1117 by Mateo Flow, RN Outcome: Adequate for Discharge 11/16/2022 1116 by Mateo Flow, RN Outcome: Adequate for Discharge Goal: Will not experience complications related to urinary retention 11/16/2022 1117 by Mateo Flow, RN Outcome: Adequate for Discharge 11/16/2022 1116 by Mateo Flow, RN Outcome: Adequate for Discharge   Problem: Pain Managment: Goal: General experience of comfort will improve 11/16/2022 1117 by Mateo Flow, RN Outcome: Adequate for Discharge 11/16/2022 1116 by Mateo Flow, RN Outcome: Adequate for Discharge   Problem: Safety: Goal: Ability to remain free from injury will improve 11/16/2022 1117 by Mateo Flow, RN Outcome: Adequate for Discharge 11/16/2022 1116 by Mateo Flow, RN Outcome: Adequate for Discharge   Problem: Skin Integrity: Goal: Risk for impaired skin integrity will decrease 11/16/2022 1117 by Mateo Flow, RN Outcome: Adequate for Discharge 11/16/2022 1116 by Mateo Flow, RN Outcome: Adequate for Discharge

## 2022-11-16 NOTE — Progress Notes (Signed)
AVS reviewed with pt, TOC medications given, [employer] contacted for ride.

## 2022-11-16 NOTE — Care Plan (Signed)
Patient discharged to self-care. Patient refused shelter placement, states he will go to his tent instead. All discharge instructions including follow-up appointments and medications explained to patient. Medications obtained from [facility] pharmacy, and given to patient at bedside. PIVs to R hand, R forearm removed. Patient left unit via wheelchair down to taxi. All personal belongings taken with patient.
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
## 2022-11-16 NOTE — Telephone Encounter (Signed)
Noted  
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
## 2022-11-16 NOTE — Telephone Encounter (Signed)
Pt called and I made postop appt with jackson in two weeks. He was asking about pain medication. I advised that a rx was given to him today. He was asking about taking two at a time due to his pain. I advised pt to take only one and to use them sparingly. I advised to take tylenol between for breakthrough pain. He stated he doesn't have money for tylenol. I advised that [facility] sells tylenol for $2 but he stated he just didn't feel like going and picking this up today.I advised to just go home then and keep foot elevated to reduce swelling. He stated understanding

## 2022-11-18 ENCOUNTER — Encounter (HOSPITAL_COMMUNITY): Payer: Self-pay | Admitting: Orthopaedic Surgery

## 2022-11-22 ENCOUNTER — Emergency Department (HOSPITAL_COMMUNITY)
Admission: EM | Admit: 2022-11-22 | Discharge: 2022-11-23 | Disposition: A | Discharge status: Home / Self Care | Payer: MEDICAID | Attending: Emergency Medicine | Admitting: Emergency Medicine

## 2022-11-22 ENCOUNTER — Other Ambulatory Visit: Payer: Self-pay

## 2022-11-22 DIAGNOSIS — X58XXXA Exposure to other specified factors, initial encounter: Secondary | ICD-10-CM | POA: Insufficient documentation

## 2022-11-22 DIAGNOSIS — S40922A Unspecified superficial injury of left upper arm, initial encounter: Secondary | ICD-10-CM | POA: Insufficient documentation

## 2022-11-22 DIAGNOSIS — R682 Dry mouth, unspecified: Secondary | ICD-10-CM | POA: Diagnosis not present

## 2022-11-22 DIAGNOSIS — S4992XD Unspecified injury of left shoulder and upper arm, subsequent encounter: Secondary | ICD-10-CM
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
## 2022-11-23 ENCOUNTER — Telehealth: Payer: Self-pay | Admitting: Orthopaedic Surgery

## 2022-11-23 ENCOUNTER — Encounter (HOSPITAL_BASED_OUTPATIENT_CLINIC_OR_DEPARTMENT_OTHER): Payer: MEDICAID | Admitting: Student

## 2022-11-23 ENCOUNTER — Other Ambulatory Visit: Payer: Self-pay | Admitting: Orthopaedic Surgery

## 2022-11-23 MED ORDER — ACETAMINOPHEN 500 MG PO TABS
1000.0000 mg | ORAL_TABLET | Freq: Once | ORAL | Status: AC
  Administered 2022-11-23: 1000 mg via ORAL
  Filled 2022-11-23: qty 2

## 2022-11-23 MED ORDER — IBUPROFEN 800 MG PO TABS: 800.0000 mg | ORAL_TABLET | Freq: Three times a day (TID) | ORAL | 3 refills | Status: DC
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
## 2022-11-23 NOTE — Telephone Encounter (Signed)
Patient called and was waiting on pain medication for Oxycodone. He said its needs to be on [facility] on [address]. CB#[phone]

## 2022-11-23 NOTE — Telephone Encounter (Signed)
Double message. I have already sent one to dr. Steward Drone

## 2022-11-23 NOTE — Telephone Encounter (Signed)
Please advise 

## 2022-11-23 NOTE — Telephone Encounter (Signed)
Patient called. He would like some pain medication called in for him.

## 2022-11-23 NOTE — Discharge Instructions (Signed)
For pain control you may take 1000 mg of Tylenol every 8 hours as needed.  

## 2022-11-26 ENCOUNTER — Other Ambulatory Visit: Payer: Self-pay

## 2022-11-26 ENCOUNTER — Emergency Department (HOSPITAL_COMMUNITY)
Admission: EM | Admit: 2022-11-26 | Discharge: 2022-11-27 | Disposition: A | Discharge status: Home / Self Care | Payer: MEDICAID | Attending: Emergency Medicine | Admitting: Emergency Medicine

## 2022-11-26 ENCOUNTER — Encounter (HOSPITAL_COMMUNITY): Payer: Self-pay | Admitting: Emergency Medicine

## 2022-11-26 DIAGNOSIS — E860 Dehydration: Secondary | ICD-10-CM | POA: Insufficient documentation

## 2022-11-26 DIAGNOSIS — R55 Syncope and collapse: Secondary | ICD-10-CM

## 2022-11-26 DIAGNOSIS — E876 Hypokalemia: Secondary | ICD-10-CM | POA: Diagnosis not present

## 2022-11-26 LAB — CBG MONITORING, ED: Glucose-Capillary: 88 mg/dL (ref 70–99)
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
## 2022-11-27 ENCOUNTER — Telehealth: Payer: Self-pay | Admitting: Orthopaedic Surgery

## 2022-11-27 ENCOUNTER — Encounter (HOSPITAL_BASED_OUTPATIENT_CLINIC_OR_DEPARTMENT_OTHER): Payer: Self-pay

## 2022-11-27 ENCOUNTER — Other Ambulatory Visit (HOSPITAL_BASED_OUTPATIENT_CLINIC_OR_DEPARTMENT_OTHER): Payer: Self-pay | Admitting: Student

## 2022-11-27 LAB — CBC
HCT: 36.6 % — ABNORMAL LOW (ref 39.0–52.0)
Hemoglobin: 12.6 g/dL — ABNORMAL LOW (ref 13.0–17.0)
MCH: 32.5 pg (ref 26.0–34.0)
MCHC: 34.4 g/dL (ref 30.0–36.0)
MCV: 94.3 fL (ref 80.0–100.0)
Platelets: 360 10*3/uL (ref 150–400)
RBC: 3.88 MIL/uL — ABNORMAL LOW (ref 4.22–5.81)
RDW: 14.6 % (ref 11.5–15.5)
WBC: 9 10*3/uL (ref 4.0–10.5)
nRBC: 0 % (ref 0.0–0.2)

## 2022-11-27 LAB — BASIC METABOLIC PANEL
Anion gap: 15 (ref 5–15)
BUN: 11 mg/dL (ref 6–20)
CO2: 20 mmol/L — ABNORMAL LOW (ref 22–32)
Calcium: 9.2 mg/dL (ref 8.9–10.3)
Chloride: 103 mmol/L (ref 98–111)
Creatinine, Ser: 0.87 mg/dL (ref 0.61–1.24)
GFR, Estimated: >60 mL/min (ref >60)
Glucose, Bld: 88 mg/dL (ref 70–99)
Potassium: 3.3 mmol/L — ABNORMAL LOW (ref 3.5–5.1)
Sodium: 138 mmol/L (ref 135–145)

## 2022-11-27 MED ORDER — POTASSIUM CHLORIDE CRYS ER 20 MEQ PO TBCR
80.0000 meq | EXTENDED_RELEASE_TABLET | Freq: Once | ORAL | Status: AC
  Administered 2022-11-27: 80 meq via ORAL
  Filled 2022-11-27: qty 4

## 2022-11-27 MED ORDER — LACTATED RINGERS IV BOLUS
1000.0000 mL | Freq: Once | INTRAVENOUS | Status: AC
  Administered 2022-11-27: 1000 mL via INTRAVENOUS

## 2022-11-27 MED ORDER — POTASSIUM CHLORIDE CRYS ER 20 MEQ PO TBCR: 40.0000 meq | EXTENDED_RELEASE_TABLET | Freq: Every day | ORAL | 0 refills | Status: DC

## 2022-11-27 MED ORDER — OXYCODONE HCL 5 MG PO TABS: 5.0000 mg | ORAL_TABLET | ORAL | 0 refills | Status: AC | PRN

## 2022-11-27 NOTE — ED Notes (Signed)
Patient ambulated to bathroom with no assistance.

## 2022-11-27 NOTE — Telephone Encounter (Signed)
Lvm advising pt.
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
## 2022-11-27 NOTE — Telephone Encounter (Signed)
Patient called. Would like oxycodone called in.

## 2022-11-27 NOTE — ED Notes (Signed)
Patient unable to provide urine sample at this time

## 2022-11-27 NOTE — ED Notes (Signed)
Patient provided meal, able to eat in full.

## 2022-11-30 ENCOUNTER — Encounter (HOSPITAL_BASED_OUTPATIENT_CLINIC_OR_DEPARTMENT_OTHER): Payer: MEDICAID | Admitting: Student

## 2022-11-30 ENCOUNTER — Emergency Department (HOSPITAL_COMMUNITY)
Admission: EM | Admit: 2022-11-30 | Discharge: 2022-12-01 | Disposition: A | Discharge status: Home / Self Care | Payer: MEDICAID

## 2022-11-30 ENCOUNTER — Other Ambulatory Visit: Payer: Self-pay

## 2022-11-30 ENCOUNTER — Encounter (HOSPITAL_COMMUNITY): Payer: Self-pay | Admitting: Emergency Medicine

## 2022-11-30 DIAGNOSIS — E860 Dehydration: Secondary | ICD-10-CM | POA: Diagnosis not present

## 2022-11-30 DIAGNOSIS — R55 Syncope and collapse: Secondary | ICD-10-CM | POA: Diagnosis present

## 2022-11-30 DIAGNOSIS — R002 Palpitations: Secondary | ICD-10-CM | POA: Insufficient documentation

## 2022-11-30 LAB — CBG MONITORING, ED: Glucose-Capillary: 72 mg/dL (ref 70–99)

## 2022-11-30 NOTE — ED Provider Triage Note (Signed)
  Emergency Medicine Provider Triage Evaluation Note  MRN:  [id number]  Arrival date & time: 11/30/22    Medically screening exam initiated at 11:39 PM.   CC:   Dizziness  HPI:  Shane Allen is a 37 y.o. year-old male presents to the ED with chief complaint of dizziness and dehydration for the past day or two.  Hx of etoh use.  History provided by patient ROS:  -As included in HPI PE:   Vitals:   11/30/22 2325  BP: 127/88  Pulse: 67  Resp: 17  Temp: 97.9 F (36.6 C)  SpO2: 100%    Non-toxic appearing No respiratory distress  MDM:   I've ordered labs in triage to expedite lab/diagnostic workup.  Patient was informed that the remainder of the evaluation will be completed by another provider, this initial triage assessment does not replace that evaluation, and the importance of remaining in the ED until their evaluation is complete.    Roxy Horseman, PA-C 11/30/22 2340

## 2022-11-30 NOTE — ED Triage Notes (Signed)
Patient BIB EMS for evaluation of dizziness.  Reports he has had "six 25 ounce beers today and a piece of bread."  Having mild abdominal pain.  Recently seen here for same symptoms.  Currently rates abdominal pain 6/10.  VSS with EMS.  BP 108/76, HR 86

## 2022-12-01 LAB — CBC WITH DIFFERENTIAL/PLATELET
Abs Immature Granulocytes: 0.02 10*3/uL (ref 0.00–0.07)
Basophils Absolute: 0.1 10*3/uL (ref 0.0–0.1)
Basophils Relative: 1 %
Eosinophils Absolute: 0.1 10*3/uL (ref 0.0–0.5)
Eosinophils Relative: 1 %
HCT: 38 % — ABNORMAL LOW (ref 39.0–52.0)
Hemoglobin: 13 g/dL (ref 13.0–17.0)
Immature Granulocytes: 0 %
Lymphocytes Relative: 22 %
Lymphs Abs: 1.9 10*3/uL (ref 0.7–4.0)
MCH: 33.4 pg (ref 26.0–34.0)
MCHC: 34.2 g/dL (ref 30.0–36.0)
MCV: 97.7 fL (ref 80.0–100.0)
Monocytes Absolute: 0.7 10*3/uL (ref 0.1–1.0)
Monocytes Relative: 8 %
Neutro Abs: 6.2 10*3/uL (ref 1.7–7.7)
Neutrophils Relative %: 68 %
Platelets: 343 10*3/uL (ref 150–400)
RBC: 3.89 MIL/uL — ABNORMAL LOW (ref 4.22–5.81)
RDW: 15.2 % (ref 11.5–15.5)
WBC: 9 10*3/uL (ref 4.0–10.5)
nRBC: 0 % (ref 0.0–0.2)

## 2022-12-01 LAB — SALICYLATE LEVEL: Salicylate Lvl: <7 mg/dL — ABNORMAL LOW (ref 7.0–30.0)

## 2022-12-01 LAB — COMPREHENSIVE METABOLIC PANEL
ALT: 17 U/L (ref 0–44)
AST: 16 U/L (ref 15–41)
Albumin: 4.2 g/dL (ref 3.5–5.0)
Alkaline Phosphatase: 77 U/L (ref 38–126)
Anion gap: 12 (ref 5–15)
BUN: 6 mg/dL (ref 6–20)
CO2: 21 mmol/L — ABNORMAL LOW (ref 22–32)
Calcium: 9.2 mg/dL (ref 8.9–10.3)
Chloride: 105 mmol/L (ref 98–111)
Creatinine, Ser: 0.77 mg/dL (ref 0.61–1.24)
GFR, Estimated: >60 mL/min (ref >60)
Glucose, Bld: 74 mg/dL (ref 70–99)
Potassium: 3.9 mmol/L (ref 3.5–5.1)
Sodium: 138 mmol/L (ref 135–145)
Total Bilirubin: 0.8 mg/dL (ref 0.3–1.2)
Total Protein: 7 g/dL (ref 6.5–8.1)

## 2022-12-01 LAB — ACETAMINOPHEN LEVEL: Acetaminophen (Tylenol), Serum: <10 ug/mL — ABNORMAL LOW (ref 10–30)

## 2022-12-01 LAB — ETHANOL: Alcohol, Ethyl (B): <10 mg/dL (ref <10)

## 2022-12-01 LAB — CBG MONITORING, ED
Glucose-Capillary: 68 mg/dL — ABNORMAL LOW (ref 70–99)
Glucose-Capillary: 127 mg/dL — ABNORMAL HIGH (ref 70–99)

## 2022-12-01 LAB — TROPONIN I (HIGH SENSITIVITY): Troponin I (High Sensitivity): 4 ng/L (ref <18)

## 2022-12-01 MED ORDER — SODIUM CHLORIDE 0.9 % IV BOLUS
1000.0000 mL | Freq: Once | INTRAVENOUS | Status: AC
  Administered 2022-12-01: 1000 mL via INTRAVENOUS
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
## 2022-12-01 NOTE — ED Notes (Signed)
Patient here complaining of dizziness-no distress, etoh abuse and homeless. NAD

## 2022-12-01 NOTE — Discharge Instructions (Addendum)
You were seen today for evaluation of your near syncopal event.  This likely from dehydration.  Please make sure you are drinking plenty of fluids and staying well-hydrated with water.  I would advise against drinking so many caffeinated drinks as this is likely what was causing your symptoms.  I have also include information for cardiologist for you to follow-up with.  If you have any concerns, new or worsening symptoms, please return to your nearest emergency department for evaluation.  Contact a doctor if: You continue to have episodes of near fainting. Get help right away if: You pass out or faint. You have any of these symptoms: Fast or uneven heartbeats (palpitations). Pain in your chest, belly, or back. Shortness of breath. You have a seizure. You have a very bad headache. You are confused. You have trouble seeing. You are very weak. You have trouble walking. You are bleeding from your mouth or butt. You have black or tarry poop (stool). These symptoms may be an emergency. Get help right away. Call your local emergency services (911 in the [place] Do not wait to see if the symptoms will go away. Do not drive yourself to the hospital.

## 2022-12-01 NOTE — ED Notes (Signed)
Pt states he is unable to give urine sample after 1l of fluids. Pt finally went to the bathroom and was able to void but  ":forgot he had to give a sample". PA updated

## 2022-12-14 ENCOUNTER — Encounter (HOSPITAL_BASED_OUTPATIENT_CLINIC_OR_DEPARTMENT_OTHER): Payer: MEDICAID | Admitting: Student

## 2022-12-14 ENCOUNTER — Telehealth (HOSPITAL_BASED_OUTPATIENT_CLINIC_OR_DEPARTMENT_OTHER): Payer: Self-pay | Admitting: Orthopaedic Surgery

## 2022-12-14 ENCOUNTER — Telehealth: Payer: Self-pay | Admitting: Orthopaedic Surgery

## 2022-12-14 NOTE — Telephone Encounter (Signed)
Patient would like oxycodone sent to pharmacy

## 2022-12-14 NOTE — Telephone Encounter (Signed)
Pt has now cancelled and r/s postop appt with jackson 2 times. He is scheduled for now to see [facility] tomorrow. Please advise

## 2022-12-15 ENCOUNTER — Encounter (HOSPITAL_BASED_OUTPATIENT_CLINIC_OR_DEPARTMENT_OTHER): Payer: MEDICAID | Admitting: Student

## 2022-12-16 ENCOUNTER — Ambulatory Visit (HOSPITAL_BASED_OUTPATIENT_CLINIC_OR_DEPARTMENT_OTHER)
Admission: RE | Admit: 2022-12-16 | Discharge: 2022-12-16 | Disposition: A | Discharge status: Home / Self Care | Payer: MEDICAID | Source: Ambulatory Visit | Attending: Orthopaedic Surgery | Admitting: Orthopaedic Surgery

## 2022-12-16 ENCOUNTER — Ambulatory Visit (HOSPITAL_BASED_OUTPATIENT_CLINIC_OR_DEPARTMENT_OTHER): Payer: MEDICAID | Admitting: Student

## 2022-12-16 ENCOUNTER — Encounter (HOSPITAL_BASED_OUTPATIENT_CLINIC_OR_DEPARTMENT_OTHER): Payer: Self-pay

## 2022-12-16 DIAGNOSIS — S5292XA Unspecified fracture of left forearm, initial encounter for closed fracture: Secondary | ICD-10-CM | POA: Insufficient documentation

## 2022-12-16 NOTE — Progress Notes (Signed)
Post Operative Evaluation    Procedure/Date of Surgery: Left radius and ulna ORIF - 11/15/2022  Interval History:   Shane Allen presents for first post op visit today 4 weeks status post the above procedure.  He does report constant pain in the forearm that get worse throughout the day with activity.  The pain is affecting his ability to sleep.  States he is taking Tylenol for pain.  He is requesting refill of pain medications.  Does report some occasional numbness in the arm.   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   Schizo affective schizophrenia [facility])    Past Surgical History:  Procedure Laterality Date   NO PAST SURGERIES     ORIF RADIAL FRACTURE Right 11/15/2022   Procedure: OPEN REDUCTION INTERNAL FIXATION (ORIF) ULNAR FRACTURE;  Surgeon: Huel Cote, MD;  Location: [facility] OR;  Service: Orthopedics;  Laterality: Right;   Social History   Socioeconomic History   Marital status: Single    Spouse name: Not on file   Number of children: Not on file   Years of education: Not on file   Highest education level: Not on file  Occupational History   Not on file  Tobacco Use   Smoking status: Former    Current packs/day: 1.00    Average packs/day: 1 pack/day for 10.0 years (10.0 ttl pk-yrs)    Types: Cigarettes   Smokeless tobacco: Never  Vaping Use   Vaping status: Unknown  Substance and Sexual Activity   Alcohol use: Yes    Comment: 4 to 5 beers a day every other week for sleep   Drug use: Yes    Types: Marijuana   Sexual activity: Never    Birth control/protection: None  Other Topics Concern   Not on file  Social History Narrative   Not on file   Social Determinants of Health   Financial Resource Strain: Not on file  Food Insecurity: Food Insecurity Present  (11/14/2022)   Hunger Vital Sign    Worried About Running Out of Food in the Last Year: Sometimes true    Ran Out of Food in the Last Year: Sometimes true  Transportation Needs: No Transportation Needs (11/14/2022)   PRAPARE - Administrator, Civil Service (Medical): No    Lack of Transportation (Non-Medical): No  Physical Activity: Not on file  Stress: Not on file  Social Connections: Not on file   Family History  Problem Relation Age of Onset   Diabetes Mother    Allergies  Allergen Reactions   Haldol [Haloperidol Lactate] Other (See Comments)    Uncontrolled muscle movement   Maalox [Calcium Carbonate Antacid] Other (See Comments)    Thinks this medication makes his face droop, but it could be another medication.   Tylenol With Codeine #3 [Acetaminophen-Codeine] Palpitations    States he can take.    Current Outpatient Medications  Medication Sig Dispense Refill   folic acid (FOLVITE) 1 MG tablet Take 1 tablet (1 mg total) by mouth daily. 60 tablet 0   methocarbamol (ROBAXIN-750) 750 MG tablet Take 1 tablet (750 mg total) by mouth every 8 (eight) hours as needed  for muscle spasms. 30 tablet 0   potassium chloride SA (KLOR-CON M) 20 MEQ tablet Take 2 tablets (40 mEq total) by mouth daily for 7 days. 14 tablet 0   thiamine (VITAMIN B1) 100 MG tablet Take 1 tablet (100 mg total) by mouth daily. 60 tablet 0   No current facility-administered medications for this visit.   No results found.  Review of Systems:   A ROS was performed including pertinent positives and negatives as documented in the HPI.   Musculoskeletal Exam:    There were no vitals taken for this visit.  Right forearm incisions are well appearing with no evidence of erythema or drainage.  Diffuse tenderness to palpation throughout the forearm.  Able to actively flex and extend at the wrist.  Elbow non-tender with ROM from 20-100 degrees.  Can form a fist with good strength.  Radial pulse 2+.  Some  paresthesias noted around incision sties.  Imaging:   Xray (left forearm 2 views): Midshaft radius and ulna ORIF hardware with good alignment.  There is large peripheral callus formation.  I personally reviewed and interpreted the radiographs.   Assessment:   Patient is 4 weeks status left post radius and ulna ORIF.  Radiographs taken today do show evidence of callus formation and healing.  Given this, he can continue with activities as tolerated.  I have encouraged him to continue working on range of motion particularly of the elbow.  Discussed that we have already sent one refill oxycodone so we will not provide any more narcotic pain medicine.  Recommend alternating ibuprofen and Tylenol.  As healing has progressed significantly, we can plan to see him back on an as-needed basis.  Plan :    - Return to clinic as needed      I personally saw and evaluated the patient, and participated in the management and treatment plan.  Hazle Nordmann, PA-C Orthopedics

## 2022-12-28 ENCOUNTER — Emergency Department (HOSPITAL_COMMUNITY): Payer: MEDICAID

## 2022-12-28 ENCOUNTER — Encounter (HOSPITAL_COMMUNITY): Payer: Self-pay

## 2022-12-28 ENCOUNTER — Emergency Department (HOSPITAL_COMMUNITY)
Admission: EM | Admit: 2022-12-28 | Discharge: 2022-12-28 | Disposition: A | Discharge status: Home / Self Care | Payer: MEDICAID | Attending: Emergency Medicine | Admitting: Emergency Medicine

## 2022-12-28 ENCOUNTER — Other Ambulatory Visit: Payer: Self-pay

## 2022-12-28 DIAGNOSIS — F209 Schizophrenia, unspecified: Secondary | ICD-10-CM | POA: Insufficient documentation

## 2022-12-28 DIAGNOSIS — R0789 Other chest pain: Secondary | ICD-10-CM | POA: Insufficient documentation

## 2022-12-28 DIAGNOSIS — R079 Chest pain, unspecified: Secondary | ICD-10-CM

## 2022-12-28 DIAGNOSIS — R1013 Epigastric pain: Secondary | ICD-10-CM | POA: Diagnosis not present

## 2022-12-28 LAB — CBC WITH DIFFERENTIAL/PLATELET
Abs Immature Granulocytes: 0.16 10*3/uL — ABNORMAL HIGH (ref 0.00–0.07)
Basophils Absolute: 0.1 10*3/uL (ref 0.0–0.1)
Basophils Relative: 1 %
Eosinophils Absolute: 0.1 10*3/uL (ref 0.0–0.5)
Eosinophils Relative: 1 %
HCT: 41.1 % (ref 39.0–52.0)
Hemoglobin: 14 g/dL (ref 13.0–17.0)
Immature Granulocytes: 2 %
Lymphocytes Relative: 20 %
Lymphs Abs: 2 10*3/uL (ref 0.7–4.0)
MCH: 33 pg (ref 26.0–34.0)
MCHC: 34.1 g/dL (ref 30.0–36.0)
MCV: 96.9 fL (ref 80.0–100.0)
Monocytes Absolute: 0.7 10*3/uL (ref 0.1–1.0)
Monocytes Relative: 7 %
Neutro Abs: 7.3 10*3/uL (ref 1.7–7.7)
Neutrophils Relative %: 69 %
Platelets: 508 10*3/uL — ABNORMAL HIGH (ref 150–400)
RBC: 4.24 MIL/uL (ref 4.22–5.81)
RDW: 14.4 % (ref 11.5–15.5)
WBC: 10.4 10*3/uL (ref 4.0–10.5)
nRBC: 0 % (ref 0.0–0.2)

## 2022-12-28 LAB — RAPID URINE DRUG SCREEN, HOSP PERFORMED
Amphetamines: NOT DETECTED
Barbiturates: NOT DETECTED
Benzodiazepines: NOT DETECTED
Cocaine: NOT DETECTED
Opiates: NOT DETECTED
Tetrahydrocannabinol: POSITIVE — AB

## 2022-12-28 LAB — URINALYSIS, ROUTINE W REFLEX MICROSCOPIC
Bilirubin Urine: NEGATIVE
Glucose, UA: NEGATIVE mg/dL
Hgb urine dipstick: NEGATIVE
Ketones, ur: NEGATIVE mg/dL
Leukocytes,Ua: NEGATIVE
Nitrite: NEGATIVE
Protein, ur: NEGATIVE mg/dL
Specific Gravity, Urine: 1.002 — ABNORMAL LOW (ref 1.005–1.030)
pH: 9 — ABNORMAL HIGH (ref 5.0–8.0)

## 2022-12-28 LAB — COMPREHENSIVE METABOLIC PANEL
ALT: 13 U/L (ref 0–44)
AST: 16 U/L (ref 15–41)
Albumin: 3.8 g/dL (ref 3.5–5.0)
Alkaline Phosphatase: 72 U/L (ref 38–126)
Anion gap: 16 — ABNORMAL HIGH (ref 5–15)
BUN: 8 mg/dL (ref 6–20)
CO2: 24 mmol/L (ref 22–32)
Calcium: 9.1 mg/dL (ref 8.9–10.3)
Chloride: 97 mmol/L — ABNORMAL LOW (ref 98–111)
Creatinine, Ser: 0.82 mg/dL (ref 0.61–1.24)
GFR, Estimated: >60 mL/min (ref >60)
Glucose, Bld: 98 mg/dL (ref 70–99)
Potassium: 3.8 mmol/L (ref 3.5–5.1)
Sodium: 137 mmol/L (ref 135–145)
Total Bilirubin: 0.4 mg/dL (ref <1.2)
Total Protein: 6.9 g/dL (ref 6.5–8.1)

## 2022-12-28 LAB — MAGNESIUM: Magnesium: 1.7 mg/dL (ref 1.7–2.4)

## 2022-12-28 LAB — TROPONIN I (HIGH SENSITIVITY)
Troponin I (High Sensitivity): 5 ng/L (ref <18)
Troponin I (High Sensitivity): 6 ng/L (ref <18)

## 2022-12-28 NOTE — Discharge Instructions (Signed)
It is important for you to consider starting to take your schizophrenia medications again.  Please follow-up with local physician.

## 2022-12-28 NOTE — ED Notes (Signed)
ED Provider at bedside. 

## 2022-12-28 NOTE — ED Triage Notes (Signed)
Pt reports severe cramping in his tongue.

## 2022-12-28 NOTE — ED Triage Notes (Signed)
Pt now reporting chest pain

## 2022-12-28 NOTE — ED Notes (Signed)
Called for triage x1-no answer. 
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
## 2022-12-28 NOTE — ED Triage Notes (Signed)
Pt reports having dry mouth, dehydration and taking a muscle relaxer today.

## 2022-12-28 NOTE — ED Notes (Signed)
Pt currently not hallway bed

## 2022-12-28 NOTE — ED Provider Notes (Signed)
[place] EMERGENCY DEPARTMENT AT [facility] Provider Note   CSN: [id number] Arrival date & time: 12/28/22  1436     History  Chief Complaint  Patient presents with   Dental Pain    Oral problem   Chest Pain    Shane Allen is a 37 y.o. male.  Patient presents with intermittent chest tightness, epigastric discomfort and cramping for the past week.  Patient said he was drinking significant alcohol the past week.  Patient intermittently homeless, he has been living with mother recently however she has had challenges due to him not being compliant in taking medication for schizophrenia.  Patient had multiple bizarre episodes and aggression over the past year.  Patient's been drinking heavily  The history is provided by the patient and a relative.  Dental Pain Chest Pain      Home Medications Prior to Admission medications   Medication Sig Start Date End Date Taking? Authorizing Provider  folic acid (FOLVITE) 1 MG tablet Take 1 tablet (1 mg total) by mouth daily. 11/17/22   Burnadette Pop, MD  methocarbamol (ROBAXIN-750) 750 MG tablet Take 1 tablet (750 mg total) by mouth every 8 (eight) hours as needed for muscle spasms. 11/16/22   Burnadette Pop, MD  potassium chloride SA (KLOR-CON M) 20 MEQ tablet Take 2 tablets (40 mEq total) by mouth daily for 7 days. 11/27/22 12/04/22  Mesner, Barbara Cower, MD  thiamine (VITAMIN B1) 100 MG tablet Take 1 tablet (100 mg total) by mouth daily. 11/17/22   Burnadette Pop, MD  asenapine (SAPHRIS) 5 MG SUBL 24 hr tablet Place 1 tablet (5 mg total) under the tongue 2 (two) times daily. 06/15/17 05/18/19  Liberty Handy, PA-C  risperiDONE (RISPERDAL) 3 MG tablet Take 1 tablet (3 mg total) by mouth 2 (two) times daily for 7 days. 01/29/19 05/18/19  Gailen Shelter, PA      Allergies    Haldol [haloperidol lactate], Maalox [calcium carbonate antacid], and Tylenol with codeine #3 [acetaminophen-codeine]    Review of Systems   Review of  Systems  Cardiovascular:  Positive for chest pain.    Physical Exam Updated Vital Signs BP (!) 139/97   Pulse 90   Temp (!) 97.5 F (36.4 C)   Resp 15   Ht 6\' 2"  (1.88 m)   Wt 90.7 kg   SpO2 100%   BMI 25.68 kg/m  Physical Exam Vitals and nursing note reviewed.  Constitutional:      General: He is not in acute distress.    Appearance: He is well-developed.  HENT:     Head: Normocephalic and atraumatic.     Mouth/Throat:     Mouth: Mucous membranes are moist.  Eyes:     General:        Right eye: No discharge.        Left eye: No discharge.     Conjunctiva/sclera: Conjunctivae normal.  Cardiovascular:     Rate and Rhythm: Normal rate and regular rhythm.  Pulmonary:     Effort: Pulmonary effort is normal.     Breath sounds: Normal breath sounds.  Abdominal:     General: There is no distension.     Palpations: Abdomen is soft.     Tenderness: There is no abdominal tenderness. There is no guarding.  Musculoskeletal:     Cervical back: Normal range of motion and neck supple. No rigidity.  Skin:    General: Skin is warm.     Capillary Refill: Capillary  refill takes less than 2 seconds.     Findings: No rash.  Neurological:     General: No focal deficit present.     Mental Status: He is alert.     Cranial Nerves: No cranial nerve deficit.  Psychiatric:     Comments: Intermittent distraction, flat affect at times, fidgety.     ED Results / Procedures / Treatments   Labs (all labs ordered are listed, but only abnormal results are displayed) Labs Reviewed  CBC WITH DIFFERENTIAL/PLATELET - Abnormal; Notable for the following components:      Result Value   Platelets 508 (*)    Abs Immature Granulocytes 0.16 (*)    All other components within normal limits  COMPREHENSIVE METABOLIC PANEL - Abnormal; Notable for the following components:   Chloride 97 (*)    Anion gap 16 (*)    All other components within normal limits  RAPID URINE DRUG SCREEN, HOSP PERFORMED -  Abnormal; Notable for the following components:   Tetrahydrocannabinol POSITIVE (*)    All other components within normal limits  URINALYSIS, ROUTINE W REFLEX MICROSCOPIC - Abnormal; Notable for the following components:   Color, Urine STRAW (*)    Specific Gravity, Urine 1.002 (*)    pH 9.0 (*)    All other components within normal limits  MAGNESIUM  TROPONIN I (HIGH SENSITIVITY)  TROPONIN I (HIGH SENSITIVITY)    EKG None EKG independently reviewed sinus rhythm, heart rate 63, no acute ST elevation, nonspecific ST changes. Radiology DG Chest 2 View  Result Date: 12/28/2022 CLINICAL DATA:  Chest pain.  Cough. EXAM: CHEST - 2 VIEW COMPARISON:  09/12/2017. FINDINGS: Bilateral lung fields are clear. Bilateral costophrenic angles are clear. Normal cardio-mediastinal silhouette. No acute osseous abnormalities. The soft tissues are within normal limits. IMPRESSION: *No active cardiopulmonary disease. Electronically Signed   By: Jules Schick M.D.   On: 12/28/2022 17:02    Procedures Procedures    Medications Ordered in ED Medications - No data to display  ED Course/ Medical Decision Making/ A&P                                 Medical Decision Making Amount and/or Complexity of Data Reviewed ECG/medicine tests: ordered.   Patient presents for chest discomfort nonspecific cramping anterior nonradiating.  Patient low risk for ACS.  Patient had 2 troponins which were reviewed independently negative.  EKG reviewed no acute ST elevation nonspecific ST abnormality.  Patient stable for outpatient follow-up for this.  Patient's mother is with him and received permission to discuss medical history.  Her concern is that he has been noncompliant with taking schizophrenia medications which is led to multiple behavioral challenges and episodes over the past year.  Consult for TTS if patient willing to talk with them.  Initially patient was refusing.  Meal brought the patient.  Blood work  independently reviewed overall reassuring anion gap of 16.  Urine results reviewed THC positive.  Patient medically clear at this time for assessment TTS, patient care be signed out to follow-up medication or further recommendations.        Final Clinical Impression(s) / ED Diagnoses Final diagnoses:  Acute chest pain  Schizophrenia, unspecified type [facility])    Rx / DC Orders ED Discharge Orders     None         Blane Ohara, MD 12/28/22 2325

## 2022-12-28 NOTE — ED Provider Triage Note (Signed)
Emergency Medicine Provider Triage Evaluation Note  Shane Allen , a 37 y.o. male  was evaluated in triage.  Pt complains of .  Review of Systems  Positive: Tongue cramping, chest tightness, abdominal pain,  Negative: Denies shortness of breath, difficulty breathing, weakness, nausea vomiting diarrhea.  Physical Exam  BP (!) 133/96 (BP Location: Right Arm)   Pulse (!) 103   Temp 98.5 F (36.9 C)   Resp 18   Ht 6\' 2"  (1.88 m)   Wt 90.7 kg   SpO2 100%   BMI 25.68 kg/m  Gen:   Awake, no distress   Resp:  Normal effort  MSK:   Moves extremities without difficulty  Other:    Medical Decision Making  Medically screening exam initiated at 4:21 PM.  Appropriate orders placed.  Shane Allen was informed that the remainder of the evaluation will be completed by another provider, this initial triage assessment does not replace that evaluation, and the importance of remaining in the ED until their evaluation is complete.  Patient reporting to emergency room with tongue cramping.  Patient reports that he has been drinking heavily for several days and stopped this morning.  Patient reports that since he stopped drinking he has felt dehydrated like his tongue is dry.  Patient does not have any tremors on exam and overall well appearing. Patient requesting water. Reports urinary frequency.    Shane Knudsen, PA-C 12/28/22 1630

## 2023-08-24 DIAGNOSIS — F209 Schizophrenia, unspecified: Secondary | ICD-10-CM | POA: Insufficient documentation

## 2023-08-25 ENCOUNTER — Other Ambulatory Visit: Payer: Self-pay

## 2023-08-25 ENCOUNTER — Ambulatory Visit (HOSPITAL_COMMUNITY)
Admission: EM | Admit: 2023-08-25 | Discharge: 2023-08-25 | Disposition: A | Discharge status: Other Acute Inpatient Hospital | Payer: MEDICAID | Attending: Psychiatry | Admitting: Psychiatry

## 2023-08-25 ENCOUNTER — Encounter (HOSPITAL_COMMUNITY): Payer: Self-pay

## 2023-08-25 DIAGNOSIS — Z046 Encounter for general psychiatric examination, requested by authority: Secondary | ICD-10-CM

## 2023-08-25 DIAGNOSIS — F209 Schizophrenia, unspecified: Secondary | ICD-10-CM | POA: Diagnosis not present

## 2023-08-25 DIAGNOSIS — F69 Unspecified disorder of adult personality and behavior: Secondary | ICD-10-CM

## 2023-08-25 LAB — COMPREHENSIVE METABOLIC PANEL WITH GFR
ALT: 17 U/L (ref 0–44)
AST: 22 U/L (ref 15–41)
Albumin: 4.6 g/dL (ref 3.5–5.0)
Alkaline Phosphatase: 68 U/L (ref 38–126)
Anion gap: 11 (ref 5–15)
BUN: 12 mg/dL (ref 6–20)
CO2: 25 mmol/L (ref 22–32)
Calcium: 9.9 mg/dL (ref 8.9–10.3)
Chloride: 100 mmol/L (ref 98–111)
Creatinine, Ser: 0.95 mg/dL (ref 0.61–1.24)
GFR, Estimated: >60 mL/min (ref >60)
Glucose, Bld: 71 mg/dL (ref 70–99)
Potassium: 4.2 mmol/L (ref 3.5–5.1)
Sodium: 136 mmol/L (ref 135–145)
Total Bilirubin: 0.4 mg/dL (ref 0.0–1.2)
Total Protein: 7.3 g/dL (ref 6.5–8.1)

## 2023-08-25 LAB — CBC WITH DIFFERENTIAL/PLATELET
Abs Immature Granulocytes: 0.03 10*3/uL (ref 0.00–0.07)
Basophils Absolute: 0.1 10*3/uL (ref 0.0–0.1)
Basophils Relative: 1 %
Eosinophils Absolute: 0.3 10*3/uL (ref 0.0–0.5)
Eosinophils Relative: 3 %
HCT: 43.8 % (ref 39.0–52.0)
Hemoglobin: 14.8 g/dL (ref 13.0–17.0)
Immature Granulocytes: 0 %
Lymphocytes Relative: 23 %
Lymphs Abs: 2.2 10*3/uL (ref 0.7–4.0)
MCH: 32.5 pg (ref 26.0–34.0)
MCHC: 33.8 g/dL (ref 30.0–36.0)
MCV: 96.1 fL (ref 80.0–100.0)
Monocytes Absolute: 1 10*3/uL (ref 0.1–1.0)
Monocytes Relative: 10 %
Neutro Abs: 6.1 10*3/uL (ref 1.7–7.7)
Neutrophils Relative %: 63 %
Platelets: 354 10*3/uL (ref 150–400)
RBC: 4.56 MIL/uL (ref 4.22–5.81)
RDW: 13.1 % (ref 11.5–15.5)
WBC: 9.6 10*3/uL (ref 4.0–10.5)
nRBC: 0 % (ref 0.0–0.2)

## 2023-08-25 LAB — TSH: TSH: 1.204 u[IU]/mL (ref 0.350–4.500)

## 2023-08-25 LAB — POCT URINE DRUG SCREEN - MANUAL ENTRY (I-SCREEN)
POC Amphetamine UR: NOT DETECTED
POC Buprenorphine (BUP): NOT DETECTED
POC Cocaine UR: NOT DETECTED
POC Marijuana UR: NOT DETECTED
POC Methadone UR: NOT DETECTED
POC Methamphetamine UR: NOT DETECTED
POC Morphine: NOT DETECTED
POC Oxazepam (BZO): NOT DETECTED
POC Oxycodone UR: NOT DETECTED
POC Secobarbital (BAR): NOT DETECTED

## 2023-08-25 LAB — ETHANOL: Alcohol, Ethyl (B): 35 mg/dL — ABNORMAL HIGH (ref <15)

## 2023-08-25 MED ORDER — ALUM & MAG HYDROXIDE-SIMETH 200-200-20 MG/5ML PO SUSP: 30.0000 mL | ORAL | Status: DC | PRN

## 2023-08-25 MED ORDER — NICOTINE POLACRILEX 2 MG MT GUM
2.0000 mg | CHEWING_GUM | OROMUCOSAL | Status: DC
  Administered 2023-08-25: 2 mg via ORAL
  Filled 2023-08-25: qty 1

## 2023-08-25 MED ORDER — OLANZAPINE 10 MG IM SOLR: 10.0000 mg | Freq: Three times a day (TID) | INTRAMUSCULAR | Status: DC | PRN

## 2023-08-25 MED ORDER — MAGNESIUM HYDROXIDE 400 MG/5ML PO SUSP: 30.0000 mL | Freq: Every day | ORAL | Status: DC | PRN

## 2023-08-25 MED ORDER — NICOTINE POLACRILEX 2 MG MT GUM
2.0000 mg | CHEWING_GUM | Freq: Once | OROMUCOSAL | Status: AC
  Administered 2023-08-25: 2 mg via ORAL
  Filled 2023-08-25: qty 1

## 2023-08-25 MED ORDER — OLANZAPINE 10 MG IM SOLR: 5.0000 mg | Freq: Three times a day (TID) | INTRAMUSCULAR | Status: DC | PRN

## 2023-08-25 MED ORDER — PALIPERIDONE ER 3 MG PO TB24
3.0000 mg | ORAL_TABLET | Freq: Every day | ORAL | Status: DC
  Administered 2023-08-25: 3 mg via ORAL
  Filled 2023-08-25: qty 1

## 2023-08-25 MED ORDER — OLANZAPINE 5 MG PO TBDP: 5.0000 mg | ORAL_TABLET | Freq: Three times a day (TID) | ORAL | Status: DC | PRN

## 2023-08-25 NOTE — ED Notes (Signed)
Patient observed resting quietly, eyes closed. Respirations equal and unlabored. Will continue to monitor for safety.  
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
## 2023-08-25 NOTE — ED Notes (Signed)
 Second call placed to [facility] to give report on patient. First call placed at 1129. LVM during both attempts. Will try again.
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
## 2023-08-25 NOTE — Progress Notes (Signed)
 LCSW Progress Note  [id number]   [place] Sigmund [facility]  08/25/2023  10:07 AM  Description:   Inpatient Psychiatric Referral  Patient was recommended inpatient per Gaither Pouch NP . There are no available beds at [facility], per [facility] Burnard Barter RN. Patient was referred to the following out of network facilities:   Destination  Service Provider Address Phone Fax  [facility]  [address] [place], [place] [address] [phone]  [facility]  [address] [place] [address] [phone]  [facility]  [address] [place] [address] [phone]  [facility]  [address] [place] [address] [phone]  [facility]  [address] [place] [address] [phone]  [facility]  [address] [place] [address] [phone]      Situation ongoing, CSW to continue following and update chart as more information becomes available.      [place] Myrissa Chipley MSW, LCSW  08/25/2023 10:07 AM
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
## 2023-08-25 NOTE — ED Notes (Signed)
 Called [facility] House Supervisor number per protocol. LVM

## 2023-08-25 NOTE — ED Notes (Signed)
 Report given to Sheppard Babe, RN.

## 2023-08-25 NOTE — ED Notes (Signed)
 Patient discharged in no acute distress. Belongings returned and handed to the sheriff for transport to [facility].

## 2023-08-25 NOTE — ED Notes (Signed)
 Patient A&Ox4. Has been calm, cooperative. He denies intent to harm self/others. Denies A/VH. Patient denies any physical pain or discomfort despite having multiple abrasions on his hands. No acute distress observed. Routine safety checks conducted according to facility protocol. Patient agreed to notify staff should thoughts of harm toward self or others arise. We will continue to monitor for safety.

## 2023-08-25 NOTE — ED Notes (Signed)
Pt asleep at this hour. No apparent distress. RR even and unlabored. Monitored for safety.  

## 2023-08-25 NOTE — Discharge Instructions (Signed)

## 2023-08-25 NOTE — Progress Notes (Signed)
 Pt has been accepted to [facility] TODAY 08/25/2023  Bed assignment: Main campus  Pt meets inpatient criteria per: Gaither Pouch NP  Attending Physician will be Millie Manners, MD  Report can be called to: [phone] (this is a pager, please leave call-back number when giving report)  Pt can arrive ASAP   Care Team Notified: Suzen Cart RN   [place] Shanyn Preisler LCSW-A   08/25/2023 10:35 AM

## 2023-10-02 ENCOUNTER — Emergency Department (HOSPITAL_COMMUNITY): Payer: MEDICAID

## 2023-10-02 ENCOUNTER — Emergency Department (HOSPITAL_COMMUNITY)
Admission: EM | Admit: 2023-10-02 | Discharge: 2023-10-02 | Disposition: A | Discharge status: Home / Self Care | Payer: MEDICAID | Attending: Emergency Medicine | Admitting: Emergency Medicine

## 2023-10-02 ENCOUNTER — Encounter (HOSPITAL_COMMUNITY): Payer: Self-pay

## 2023-10-02 ENCOUNTER — Other Ambulatory Visit: Payer: Self-pay

## 2023-10-02 DIAGNOSIS — D72829 Elevated white blood cell count, unspecified: Secondary | ICD-10-CM | POA: Diagnosis not present

## 2023-10-02 DIAGNOSIS — R079 Chest pain, unspecified: Secondary | ICD-10-CM | POA: Diagnosis present

## 2023-10-02 DIAGNOSIS — Z720 Tobacco use: Secondary | ICD-10-CM | POA: Diagnosis not present

## 2023-10-02 DIAGNOSIS — R072 Precordial pain: Secondary | ICD-10-CM | POA: Diagnosis not present

## 2023-10-02 LAB — CBC WITH DIFFERENTIAL/PLATELET
Abs Immature Granulocytes: 0.03 K/uL (ref 0.00–0.07)
Basophils Absolute: 0.1 K/uL (ref 0.0–0.1)
Basophils Relative: 1 %
Eosinophils Absolute: 0.1 K/uL (ref 0.0–0.5)
Eosinophils Relative: 1 %
HCT: 45.4 % (ref 39.0–52.0)
Hemoglobin: 15.7 g/dL (ref 13.0–17.0)
Immature Granulocytes: 0 %
Lymphocytes Relative: 12 %
Lymphs Abs: 1.4 K/uL (ref 0.7–4.0)
MCH: 32.5 pg (ref 26.0–34.0)
MCHC: 34.6 g/dL (ref 30.0–36.0)
MCV: 94 fL (ref 80.0–100.0)
Monocytes Absolute: 0.7 K/uL (ref 0.1–1.0)
Monocytes Relative: 6 %
Neutro Abs: 9.1 K/uL — ABNORMAL HIGH (ref 1.7–7.7)
Neutrophils Relative %: 80 %
Platelets: 310 K/uL (ref 150–400)
RBC: 4.83 MIL/uL (ref 4.22–5.81)
RDW: 13.5 % (ref 11.5–15.5)
WBC: 11.4 K/uL — ABNORMAL HIGH (ref 4.0–10.5)
nRBC: 0 % (ref 0.0–0.2)

## 2023-10-02 LAB — BASIC METABOLIC PANEL WITH GFR
Anion gap: 14 (ref 5–15)
BUN: 8 mg/dL (ref 6–20)
CO2: 24 mmol/L (ref 22–32)
Calcium: 10 mg/dL (ref 8.9–10.3)
Chloride: 100 mmol/L (ref 98–111)
Creatinine, Ser: 1.14 mg/dL (ref 0.61–1.24)
GFR, Estimated: >60 mL/min (ref >60)
Glucose, Bld: 81 mg/dL (ref 70–99)
Potassium: 4.2 mmol/L (ref 3.5–5.1)
Sodium: 138 mmol/L (ref 135–145)

## 2023-10-02 LAB — TROPONIN I (HIGH SENSITIVITY): Troponin I (High Sensitivity): 5 ng/L (ref <18)

## 2023-10-02 MED ORDER — FAMOTIDINE 20 MG PO TABS: 20.0000 mg | ORAL_TABLET | Freq: Two times a day (BID) | ORAL | 0 refills | Status: DC

## 2023-10-02 MED ORDER — SUCRALFATE 1 G PO TABS: 1.0000 g | ORAL_TABLET | Freq: Once | ORAL | Status: DC

## 2023-10-02 MED ORDER — SUCRALFATE 1 G PO TABS: 1.0000 g | ORAL_TABLET | Freq: Three times a day (TID) | ORAL | 0 refills | Status: DC

## 2023-10-02 MED ORDER — SUCRALFATE 1 G PO TABS
2.0000 g | ORAL_TABLET | Freq: Once | ORAL | Status: AC
  Administered 2023-10-02: 2 g via ORAL
  Filled 2023-10-02: qty 2

## 2023-10-02 MED ORDER — FAMOTIDINE 20 MG PO TABS
40.0000 mg | ORAL_TABLET | Freq: Once | ORAL | Status: AC
  Administered 2023-10-02: 40 mg via ORAL
  Filled 2023-10-02: qty 2

## 2023-10-02 NOTE — Discharge Instructions (Signed)
 Please read and follow all provided instructions.  Your diagnoses today include:  1. Precordial chest pain     Tests performed today include: An EKG of your heart: No signs of stress on the heart or abnormal heart rhythms A chest x-ray: Was clear Cardiac enzymes - a blood test for heart muscle damage, was normal Blood counts and electrolytes: No concerning findings Vital signs. See below for your results today.   Medications prescribed:  Pepcid  (famotidine ) - antihistamine  You can find this medication over-the-counter.   DO NOT exceed:  20mg  Pepcid  every 12 hours  Carafate  - for stomach upset and to protect your stomach  Take any prescribed medications only as directed.  Follow-up instructions: Please follow-up with your primary care in 1 week for further evaluation of your symptoms if not improved.   Return instructions:  SEEK IMMEDIATE MEDICAL ATTENTION IF: You have severe chest pain, especially if the pain is crushing or pressure-like and spreads to the arms, back, neck, or jaw, or if you have sweating, nausea or vomiting, or trouble with breathing. THIS IS AN EMERGENCY. Do not wait to see if the pain will go away. Get medical help at once. Call 911. DO NOT drive yourself to the hospital.  Your chest pain gets worse and does not go away after a few minutes of rest.  You have an attack of chest pain lasting longer than what you usually experience.  You have significant dizziness, if you pass out, or have trouble walking.  You have chest pain not typical of your usual pain for which you originally saw your caregiver.  You have any other emergent concerns regarding your health.  Additional Information: Chest pain comes from many different causes. Your caregiver has diagnosed you as having chest pain that is not specific for one problem, but does not require admission.  You are at low risk for an acute heart condition or other serious illness.   Your vital signs today  were: BP (!) 140/98 (BP Location: Left Arm)   Pulse 62   Temp 98.2 F (36.8 C) (Oral)   Resp 16   SpO2 100%  If your blood pressure (BP) was elevated above 135/85 this visit, please have this repeated by your doctor within one month. --------------

## 2023-10-02 NOTE — ED Triage Notes (Signed)
 Pt BIB [facility] from home for c/o heartburn since last night. Pt reports this has happened before but with less severity. Pt c/o nausea with no vomiting. VSS per EMS.

## 2023-10-02 NOTE — ED Provider Notes (Signed)
 [place] EMERGENCY DEPARTMENT AT [facility] Provider Note   CSN: [id number] Arrival date & time: 10/02/23  9170     Patient presents with: Heartburn   Shane Allen is a 38 y.o. male.    Patient with history of tobacco use, schizophrenia --presents to the emergency department today for evaluation of chest pain which he describes as a heartburn.  He describes a burning pain in the mid chest without radiation, belching.  Symptoms are still present, but improved after he got into the ambulance.  He has had some epigastric tenderness.  No vomiting.  No shortness of breath or fever.  No cough.  He does endorse use of energy drinks and alcohol for the past day.  Denies history of heavy NSAID use.  Patient has had similar symptoms in the past but today was more severe.      Prior to Admission medications   Medication Sig Start Date End Date Taking? Authorizing Provider  paliperidone  (INVEGA ) 3 MG 24 hr tablet Take 3 mg by mouth at bedtime. 07/13/23   [provider]  traZODone  (DESYREL ) 50 MG tablet Take 50 mg by mouth at bedtime. Patient not taking: Reported on 08/25/2023 06/02/23   [provider]  asenapine  (SAPHRIS ) 5 MG SUBL 24 hr tablet Place 1 tablet (5 mg total) under the tongue 2 (two) times daily. 06/15/17 05/18/19  Norris Will PARAS, PA-C  risperiDONE  (RISPERDAL ) 3 MG tablet Take 1 tablet (3 mg total) by mouth 2 (two) times daily for 7 days. 01/29/19 05/18/19  Neldon Hamp RAMAN, PA    Allergies: Haldol [haloperidol lactate] and Maalox [calcium carbonate antacid]    Review of Systems  Updated Vital Signs BP (!) 140/98 (BP Location: Left Arm)   Pulse 62   Temp 98.2 F (36.8 C) (Oral)   Resp 16   SpO2 100%   Physical Exam Vitals and nursing note reviewed.  Constitutional:      Appearance: He is well-developed. He is not diaphoretic.  HENT:     Head: Normocephalic and atraumatic.     Nose: Nose normal.     Mouth/Throat:     Mouth: Mucous  membranes are moist. Mucous membranes are not dry.  Eyes:     Conjunctiva/sclera: Conjunctivae normal.  Neck:     Vascular: Normal carotid pulses. No carotid bruit or JVD.     Trachea: Trachea normal. No tracheal deviation.  Cardiovascular:     Rate and Rhythm: Normal rate and regular rhythm.     Pulses: No decreased pulses.          Radial pulses are 2+ on the right side and 2+ on the left side.     Heart sounds: Normal heart sounds, S1 normal and S2 normal. Heart sounds not distant. No murmur heard. Pulmonary:     Effort: Pulmonary effort is normal. No respiratory distress.     Breath sounds: Normal breath sounds. No wheezing.  Chest:     Chest wall: No tenderness.  Abdominal:     General: Bowel sounds are normal.     Palpations: Abdomen is soft.     Tenderness: There is no abdominal tenderness. There is no guarding or rebound.  Musculoskeletal:     Cervical back: Normal range of motion and neck supple. No muscular tenderness.     Right lower leg: No edema.     Left lower leg: No edema.  Skin:    General: Skin is warm and dry.  Coloration: Skin is not pale.  Neurological:     Mental Status: He is alert. Mental status is at baseline.  Psychiatric:        Mood and Affect: Mood normal.     (all labs ordered are listed, but only abnormal results are displayed) Labs Reviewed  CBC WITH DIFFERENTIAL/PLATELET - Abnormal; Notable for the following components:      Result Value   WBC 11.4 (*)    Neutro Abs 9.1 (*)    All other components within normal limits  BASIC METABOLIC PANEL WITH GFR  TROPONIN I (HIGH SENSITIVITY)    ED ECG REPORT   Date: 10/02/2023  Rate: 66  Rhythm: normal sinus rhythm  QRS Axis: normal  Intervals: normal  ST/T Wave abnormalities: normal  Conduction Disutrbances:none  Narrative Interpretation:   Old EKG Reviewed: unchanged except faster today  I have personally reviewed the EKG tracing and agree with the computerized printout as  noted.   Radiology: DG Chest 2 View Result Date: 10/02/2023 EXAM: 2 VIEW(S) XRAY OF THE CHEST 10/02/2023 09:22:00 AM COMPARISON: 12/28/2022 CLINICAL HISTORY: Chest burning. Per ED triage notes: Pt BIB [facility] from home for c/o heartburn since last night. Pt reports this has happened before but with less severity. Pt c/o nausea with no vomiting. VSS per EMS. FINDINGS: LUNGS AND PLEURA: No focal pulmonary opacity. No pulmonary edema. No pleural effusion. No pneumothorax. HEART AND MEDIASTINUM: No acute abnormality of the cardiac and mediastinal silhouettes. BONES AND SOFT TISSUES: No acute osseous abnormality. LIMITATIONS/ARTIFACTS: Lateral view degraded by patient arm position, not raised above the head. IMPRESSION: 1. No acute process. Electronically signed by: Rockey Kilts MD 10/02/2023 09:47 AM EDT RP Workstation: [id number]     Procedures   Medications Ordered in the ED  sucralfate  (CARAFATE ) tablet 1 g (has no administration in time range)  famotidine  (PEPCID ) tablet 40 mg (has no administration in time range)   ED Course  Patient seen and examined. History obtained directly from patient.   Labs/EKG: Ordered CBC, BMP, troponin x 1.  EKG personally reviewed and interpreted as above.  Imaging: Ordered chest x-ray.  Medications/Fluids: Ordered: P.o. Pepcid , p.o. Carafate .  Patient has an intolerance to Maalox.  Most recent vital signs reviewed and are as follows: BP (!) 140/98 (BP Location: Left Arm)   Pulse 62   Temp 98.2 F (36.8 C) (Oral)   Resp 16   SpO2 100%   Initial impression: GERD/gastritis, will evaluate for cardiac abnormality.  11:25 AM Reassessment performed. Patient appears stable.  States he is feeling better.  Labs personally reviewed and interpreted including: CBC with minimally elevated white blood cell count and neutrophils otherwise unremarkable with borderline high hemoglobin; BMP unremarkable; troponin normal at 5.  Imaging personally visualized and  interpreted including: Chest x-ray agree negative.  Reviewed pertinent lab work and imaging with patient at bedside. Questions answered.   Most current vital signs reviewed and are as follows: BP (!) 140/98 (BP Location: Left Arm)   Pulse 62   Temp 98.2 F (36.8 C) (Oral)   Resp 16   SpO2 100%   Plan: Discharge to home.   Prescriptions written for: Pepcid , Carafate   Other home care instructions discussed: Benjamine diet  Return and follow-up instructions: I encouraged patient to return to ED with severe chest pain, especially if the pain is crushing or pressure-like and spreads to the arms, back, neck, or jaw, or if they have associated sweating, vomiting, or shortness of breath with the pain, or significant  pain with activity. We discussed that the evaluation here today indicates a low-risk of serious cause of chest pain, including heart trouble or a blood clot, but no evaluation is perfect and chest pain can evolve with time. The patient verbalized understanding and agreed.  I encouraged patient to follow-up with their provider in the next 1 week for recheck.                                  Medical Decision Making Amount and/or Complexity of Data Reviewed Labs: ordered. Radiology: ordered.  Risk Prescription drug management.   For this patient's complaint of chest pain, the following emergent conditions were considered on the differential diagnosis: acute coronary syndrome, pulmonary embolism, pneumothorax, myocarditis, pericardial tamponade, aortic dissection, thoracic aortic aneurysm complication, esophageal perforation.   Other causes were also considered including: gastroesophageal reflux disease, musculoskeletal pain including costochondritis, pneumonia/pleurisy, herpes zoster, pericarditis.  In regards to possibility of ACS, patient has atypical features of pain, non-ischemic and unchanged EKG and negative troponin(s). Heart score was calculated to be 1  In regards to  possibility of PE, symptoms are atypical for PE and risk profile is low, making PE low likelihood.  The patient's vital signs, pertinent lab work and imaging were reviewed and interpreted as discussed in the ED course. Hospitalization was considered for further testing, treatments, or serial exams/observation. However as patient is well-appearing, has a stable exam, and reassuring studies today, I do not feel that they warrant admission at this time. This plan was discussed with the patient who verbalizes agreement and comfort with this plan and seems reliable and able to return to the Emergency Department with worsening or changing symptoms.       Final diagnoses:  Precordial chest pain    ED Discharge Orders          Ordered    famotidine  (PEPCID ) 20 MG tablet  2 times daily        10/02/23 1127    sucralfate  (CARAFATE ) 1 g tablet  3 times daily with meals & bedtime        10/02/23 1127               Desiderio Chew, PA-C 10/02/23 1154    Pamella Sharper A, DO 10/02/23 1659

## 2023-12-15 ENCOUNTER — Encounter (HOSPITAL_COMMUNITY): Payer: Self-pay

## 2023-12-15 ENCOUNTER — Emergency Department (HOSPITAL_COMMUNITY): Payer: MEDICAID

## 2023-12-15 ENCOUNTER — Emergency Department (HOSPITAL_COMMUNITY)
Admission: EM | Admit: 2023-12-15 | Discharge: 2023-12-16 | Disposition: A | Discharge status: Other Acute Inpatient Hospital | Payer: MEDICAID | Attending: Emergency Medicine | Admitting: Emergency Medicine

## 2023-12-15 ENCOUNTER — Other Ambulatory Visit: Payer: Self-pay

## 2023-12-15 DIAGNOSIS — Y92009 Unspecified place in unspecified non-institutional (private) residence as the place of occurrence of the external cause: Secondary | ICD-10-CM | POA: Insufficient documentation

## 2023-12-15 DIAGNOSIS — F29 Unspecified psychosis not due to a substance or known physiological condition: Secondary | ICD-10-CM | POA: Diagnosis not present

## 2023-12-15 DIAGNOSIS — F1010 Alcohol abuse, uncomplicated: Secondary | ICD-10-CM | POA: Diagnosis not present

## 2023-12-15 DIAGNOSIS — R443 Hallucinations, unspecified: Secondary | ICD-10-CM

## 2023-12-15 DIAGNOSIS — S81811A Laceration without foreign body, right lower leg, initial encounter: Secondary | ICD-10-CM | POA: Insufficient documentation

## 2023-12-15 DIAGNOSIS — W228XXA Striking against or struck by other objects, initial encounter: Secondary | ICD-10-CM | POA: Insufficient documentation

## 2023-12-15 DIAGNOSIS — S8991XA Unspecified injury of right lower leg, initial encounter: Secondary | ICD-10-CM | POA: Diagnosis present

## 2023-12-15 DIAGNOSIS — Z23 Encounter for immunization: Secondary | ICD-10-CM | POA: Diagnosis not present

## 2023-12-15 LAB — COMPREHENSIVE METABOLIC PANEL WITH GFR
ALT: 21 U/L (ref 0–44)
AST: 37 U/L (ref 15–41)
Albumin: 3.8 g/dL (ref 3.5–5.0)
Alkaline Phosphatase: 63 U/L (ref 38–126)
Anion gap: 9 (ref 5–15)
BUN: 7 mg/dL (ref 6–20)
CO2: 23 mmol/L (ref 22–32)
Calcium: 8.2 mg/dL — ABNORMAL LOW (ref 8.9–10.3)
Chloride: 100 mmol/L (ref 98–111)
Creatinine, Ser: 0.94 mg/dL (ref 0.61–1.24)
GFR, Estimated: >60 mL/min (ref >60)
Glucose, Bld: 78 mg/dL (ref 70–99)
Potassium: 4 mmol/L (ref 3.5–5.1)
Sodium: 132 mmol/L — ABNORMAL LOW (ref 135–145)
Total Bilirubin: 0.5 mg/dL (ref 0.0–1.2)
Total Protein: 6.3 g/dL — ABNORMAL LOW (ref 6.5–8.1)

## 2023-12-15 LAB — CBC WITH DIFFERENTIAL/PLATELET
Abs Immature Granulocytes: 0.06 K/uL (ref 0.00–0.07)
Basophils Absolute: 0.1 K/uL (ref 0.0–0.1)
Basophils Relative: 1 %
Eosinophils Absolute: 0.4 K/uL (ref 0.0–0.5)
Eosinophils Relative: 2 %
HCT: 38.3 % — ABNORMAL LOW (ref 39.0–52.0)
Hemoglobin: 12.9 g/dL — ABNORMAL LOW (ref 13.0–17.0)
Immature Granulocytes: 0 %
Lymphocytes Relative: 17 %
Lymphs Abs: 2.4 K/uL (ref 0.7–4.0)
MCH: 32.2 pg (ref 26.0–34.0)
MCHC: 33.7 g/dL (ref 30.0–36.0)
MCV: 95.5 fL (ref 80.0–100.0)
Monocytes Absolute: 1 K/uL (ref 0.1–1.0)
Monocytes Relative: 7 %
Neutro Abs: 10.4 K/uL — ABNORMAL HIGH (ref 1.7–7.7)
Neutrophils Relative %: 73 %
Platelets: 285 K/uL (ref 150–400)
RBC: 4.01 MIL/uL — ABNORMAL LOW (ref 4.22–5.81)
RDW: 12.7 % (ref 11.5–15.5)
WBC: 14.3 K/uL — ABNORMAL HIGH (ref 4.0–10.5)
nRBC: 0 % (ref 0.0–0.2)

## 2023-12-15 MED ORDER — PALIPERIDONE ER 6 MG PO TB24
6.0000 mg | ORAL_TABLET | Freq: Every day | ORAL | Status: DC
  Administered 2023-12-15 – 2023-12-16 (×2): 6 mg via ORAL
  Filled 2023-12-15 (×2): qty 1

## 2023-12-15 MED ORDER — LIDOCAINE-EPINEPHRINE-TETRACAINE (LET) TOPICAL GEL
3.0000 mL | Freq: Once | TOPICAL | Status: AC
  Administered 2023-12-15: 3 mL via TOPICAL
  Filled 2023-12-15: qty 3

## 2023-12-15 MED ORDER — STERILE WATER FOR INJECTION IJ SOLN
INTRAMUSCULAR | Status: AC
  Filled 2023-12-15: qty 10

## 2023-12-15 MED ORDER — NICOTINE POLACRILEX 2 MG MT GUM
4.0000 mg | CHEWING_GUM | OROMUCOSAL | Status: DC | PRN
Start: 2023-12-15 — End: 2023-12-16
  Administered 2023-12-15 – 2023-12-16 (×5): 4 mg via ORAL
  Filled 2023-12-15 (×6): qty 2

## 2023-12-15 MED ORDER — OXYCODONE-ACETAMINOPHEN 5-325 MG PO TABS
1.0000 | ORAL_TABLET | Freq: Once | ORAL | Status: DC
  Filled 2023-12-15: qty 1

## 2023-12-15 MED ORDER — TRAZODONE HCL 50 MG PO TABS
50.0000 mg | ORAL_TABLET | Freq: Every day | ORAL | Status: DC
  Administered 2023-12-15: 50 mg via ORAL
  Filled 2023-12-15: qty 1

## 2023-12-15 MED ORDER — OLANZAPINE 5 MG PO TBDP: 10.0000 mg | ORAL_TABLET | Freq: Three times a day (TID) | ORAL | Status: DC | PRN

## 2023-12-15 MED ORDER — LORAZEPAM 1 MG PO TABS: 1.0000 mg | ORAL_TABLET | ORAL | Status: DC | PRN

## 2023-12-15 MED ORDER — OXYCODONE-ACETAMINOPHEN 5-325 MG PO TABS
2.0000 | ORAL_TABLET | Freq: Once | ORAL | Status: AC
  Administered 2023-12-15: 2 via ORAL
  Filled 2023-12-15: qty 2

## 2023-12-15 MED ORDER — BACITRACIN ZINC 500 UNIT/GM EX OINT
TOPICAL_OINTMENT | Freq: Two times a day (BID) | CUTANEOUS | Status: DC
  Administered 2023-12-15 – 2023-12-16 (×3): 1 via TOPICAL
  Filled 2023-12-15 (×3): qty 0.9

## 2023-12-15 MED ORDER — HYDROCODONE-ACETAMINOPHEN 5-325 MG PO TABS
1.0000 | ORAL_TABLET | Freq: Once | ORAL | Status: AC
  Administered 2023-12-15: 1 via ORAL
  Filled 2023-12-15: qty 1

## 2023-12-15 MED ORDER — OXYCODONE-ACETAMINOPHEN 5-325 MG PO TABS
1.0000 | ORAL_TABLET | Freq: Once | ORAL | Status: AC
  Administered 2023-12-15: 1 via ORAL
  Filled 2023-12-15: qty 1

## 2023-12-15 MED ORDER — ZIPRASIDONE MESYLATE 20 MG IM SOLR: 20.0000 mg | INTRAMUSCULAR | Status: DC | PRN

## 2023-12-15 MED ORDER — TETANUS-DIPHTH-ACELL PERTUSSIS 5-2-15.5 LF-MCG/0.5 IM SUSP
0.5000 mL | Freq: Once | INTRAMUSCULAR | Status: AC
  Administered 2023-12-15: 0.5 mL via INTRAMUSCULAR
  Filled 2023-12-15: qty 0.5

## 2023-12-15 MED ORDER — ZIPRASIDONE MESYLATE 20 MG IM SOLR
20.0000 mg | Freq: Once | INTRAMUSCULAR | Status: DC
  Filled 2023-12-15: qty 20

## 2023-12-15 NOTE — ED Notes (Signed)
 IVC File # [id number]

## 2023-12-15 NOTE — Progress Notes (Signed)
 Pt has been accepted to [facility] on 12/15/2023 . Bed assignment: [facility]   Pt meets inpatient criteria per Jerel Gravely, NP   Attending Physician will be Dr. Jess   Report can be called to: - [phone]   Pt can arrive after 8AM   Care Team Notified: Dorothyann Pouch, RN, Jerel Gravely, NP

## 2023-12-15 NOTE — ED Notes (Signed)
 Pt asking for nicotine  gum. Message sent to pharmacy to send gum

## 2023-12-15 NOTE — ED Provider Notes (Signed)
 [place] EMERGENCY DEPARTMENT AT [facility] Provider Note   CSN: [id number] Arrival date & time: 12/15/23  0535     Patient presents with: Hallucinations   Shane Allen is a 38 y.o. male.  {Add pertinent medical, surgical, social history, OB history to HPI:4765} 39 year old male brought in custody of the [facility] department under IVC for erratic behavior and hallucinations.  Patient is a history of schizophrenia.  Also history of alcoholism behavior towards his mom.  Mother is incapacitated basically and called 911 because she heard him tearing up the house.  On their arrival they noted that he had a lot of blood over the place with a cut to his right leg and appeared to be hallucinating and intoxicated.  He was involuntarily committed, EMS was called and brought here for further evaluation.  Patient only complains of pain to his foot and leg on the right.  Unknown tetanus shot.  Denies other drugs.        Prior to Admission medications   Medication Sig Start Date End Date Taking? Authorizing Provider  famotidine  (PEPCID ) 20 MG tablet Take 1 tablet (20 mg total) by mouth 2 (two) times daily. 10/02/23   Geiple, Joshua, PA-C  paliperidone  (INVEGA ) 3 MG 24 hr tablet Take 3 mg by mouth at bedtime. 07/13/23   [provider]  sucralfate  (CARAFATE ) 1 g tablet Take 1 tablet (1 g total) by mouth 4 (four) times daily -  with meals and at bedtime. 10/02/23   Geiple, Joshua, PA-C  traZODone  (DESYREL ) 50 MG tablet Take 50 mg by mouth at bedtime. Patient not taking: Reported on 08/25/2023 06/02/23   [provider]  asenapine  (SAPHRIS ) 5 MG SUBL 24 hr tablet Place 1 tablet (5 mg total) under the tongue 2 (two) times daily. 06/15/17 05/18/19  Norris Will PARAS, PA-C  risperiDONE  (RISPERDAL ) 3 MG tablet Take 1 tablet (3 mg total) by mouth 2 (two) times daily for 7 days. 01/29/19 05/18/19  Neldon Hamp RAMAN, PA    Allergies: Haldol [haloperidol lactate] and Maalox [calcium  carbonate antacid]    Review of Systems  Updated Vital Signs BP 134/86   Pulse 85   Temp 97.8 F (36.6 C) (Oral)   Resp 16   Ht 6' 2 (1.88 m)   Wt 90.7 kg   SpO2 100%   BMI 25.67 kg/m   Physical Exam Vitals and nursing note reviewed.  Constitutional:      Appearance: He is well-developed.  HENT:     Head: Normocephalic and atraumatic.  Cardiovascular:     Rate and Rhythm: Normal rate.  Pulmonary:     Effort: Pulmonary effort is normal. No respiratory distress.  Abdominal:     General: There is no distension.  Musculoskeletal:        General: Normal range of motion.     Cervical back: Normal range of motion.  Skin:    Comments: Proximately 4 cm curvilinear laceration to his right lower extremity anterior shin area.  Neurovascular intact distally.  Able to move all his toes and his foot with symmetric strength.  Neurological:     Mental Status: He is alert.     (all labs ordered are listed, but only abnormal results are displayed) Labs Reviewed  CBC WITH DIFFERENTIAL/PLATELET - Abnormal; Notable for the following components:      Result Value   WBC 14.3 (*)    RBC 4.01 (*)    Hemoglobin 12.9 (*)    HCT 38.3 (*)  Neutro Abs 10.4 (*)    All other components within normal limits  COMPREHENSIVE METABOLIC PANEL WITH GFR  ETHANOL  RAPID URINE DRUG SCREEN, HOSP PERFORMED    EKG: None  Radiology: DG Tibia/Fibula Right Result Date: 12/15/2023 EXAM: 2 VIEW(S) XRAY OF THE RIGHT TIBIA AND FIBULA 12/15/2023 06:22:00 AM COMPARISON: None available. CLINICAL HISTORY: eval for fracture/injury. Table formatting from the original note was not included.; Images from the original note were not included.; Pt BIB [facility] from home c/o aggressive behavior. Pt family states pt is schizophrenic and non-compliant with medications. Per EMS, pt was punching holes in walls and tearing up his apartment. Per EMS, pt is having auditory hallucinations. Upon ; assessment, pt has lac to RLE.  FINDINGS: BONES AND JOINTS: No acute fracture. No focal osseous lesion. No joint dislocation. SOFT TISSUES: Mild soft tissue swelling of the distal lower extremity. IMPRESSION: 1. No acute fracture or dislocation. 2. Soft tissue swelling of the distal lower extremity. Electronically signed by: Waddell Calk MD 12/15/2023 06:33 AM EDT RP Workstation: [id number]    {Document cardiac monitor, telemetry assessment procedure when appropriate:32947} .Laceration Repair  Date/Time: 12/15/2023 6:43 AM  Performed by: Lorette Mayo, MD Authorized by: Lorette Mayo, MD   Consent:    Consent obtained:  Verbal   Consent given by:  Patient   Risks, benefits, and alternatives were discussed: yes     Risks discussed:  Infection, need for additional repair, nerve damage, poor wound healing, poor cosmetic result, pain and retained foreign body   Alternatives discussed:  No treatment, delayed treatment, observation and referral Universal protocol:    Procedure explained and questions answered to patient or proxy's satisfaction: yes     Immediately prior to procedure, a time out was called: yes     Patient identity confirmed:  Verbally with patient and arm band Anesthesia:    Anesthesia method:  Topical application   Topical anesthetic:  LET Laceration details:    Location:  Leg   Leg location:  R lower leg   Length (cm):  4   Depth (mm):  6 Pre-procedure details:    Preparation:  Patient was prepped and draped in usual sterile fashion Exploration:    Imaging obtained: x-ray     Imaging outcome: foreign body not noted     Wound exploration: wound explored through full range of motion and entire depth of wound visualized   Treatment:    Area cleansed with:  Shur-Clens and soap and water    Amount of cleaning:  Standard   Irrigation solution:  Sterile water    Irrigation volume:  200   Irrigation method:  Syringe Skin repair:    Repair method:  Staples   Number of staples:  6 Approximation:     Approximation:  Close Repair type:    Repair type:  Simple Post-procedure details:    Dressing:  Antibiotic ointment   Procedure completion:  Tolerated well, no immediate complications    Medications Ordered in the ED  nicotine  polacrilex (NICORETTE ) gum 4 mg (4 mg Oral Given 12/15/23 0611)  ziprasidone  (GEODON ) injection 20 mg (has no administration in time range)  sterile water  (preservative free) injection (has no administration in time range)  oxyCODONE -acetaminophen  (PERCOCET/ROXICET) 5-325 MG per tablet 1 tablet (has no administration in time range)  bacitracin ointment (has no administration in time range)  lidocaine -EPINEPHrine-tetracaine (LET) topical gel (3 mLs Topical Given 12/15/23 0615)  Tdap (ADACEL) injection 0.5 mL (0.5 mLs Intramuscular Given 12/15/23 0614)  oxyCODONE -acetaminophen  (PERCOCET/ROXICET) 5-325  MG per tablet 2 tablet (2 tablets Oral Given 12/15/23 0610)      {Click here for ABCD2, HEART and other calculators REFRESH Note before signing:1}                              Medical Decision Making Amount and/or Complexity of Data Reviewed Labs: ordered. Radiology: ordered.  Risk OTC drugs. Prescription drug management.   Pain treated.  Tdap updated.  Will check basic labs for TTS medical evaluation.  Wound repaired as above, nursing for wound care and dressing.  24-hour first exam done and with nursing.   Final diagnoses:  None    ED Discharge Orders     None

## 2023-12-15 NOTE — ED Notes (Signed)
 IVC'd 12/15/23, exp 12/22/23; IVC docs [place]

## 2023-12-15 NOTE — Progress Notes (Signed)
 Security called to escort pt to 11 after pt would not come out of bathroom or answer knocking. Pt took coffee into restroom and spilled it over most of the floor. When questioned what happened, pt was suspicious and would deflect  questioning what does it matter, I just need some more coffee, I drank it all.

## 2023-12-15 NOTE — ED Notes (Signed)
 Pt updated that nicotine  gum requested from pharmacy.

## 2023-12-15 NOTE — Consult Note (Addendum)
 [facility] Psychiatric Consult Initial  Patient Name: .Shane Allen  MRN: [id number]  DOB: [birth year]  Consult Order details:  Orders (From admission, onward)     Start     Ordered   12/15/23 0828  CONSULT TO CALL ACT TEAM       Ordering Provider: Jerrol Agent, MD  Provider:  (Not yet assigned)  Question:  Reason for Consult?  Answer:  Agitation, aggressive behavior, under IVC, hallucinations   12/15/23 0827             Mode of Visit: In person    Psychiatry Consult Evaluation  Service Date: December 15, 2023 LOS:  LOS: 0 days  Chief Complaint: Psychosis   Primary Psychiatric Diagnoses    Psychosis (HCC)   Alcohol abuse  Assessment   Shane Allen is a 38 y.o. Caucasian male with a past history of schizophrenia and polysubstance abuse, with pertinent medical comorbidities/history that include recent laceration to leg with subsequent placement of staples this encounter, who presented this encounter by way of EMS and GPD under involuntary commitment, for concerns for psychosis, who upon EDP evaluation, consulted psychiatry for specialty evaluation and recommendations.  Patient is medically clear, per EDP team, as well as remains under involuntary commitment at this time.  Upon evaluation, patient presents with symptomology that is most consistent with unspecified psychosis, with highest on the differential being decompensation of the patient's chronic illness course of schizophrenia versus substance-induced psychosis, as well as ETOH abuse. Unfortunately UDS and EtOH laboratory studies have not been able to be obtained at this time, nor has family been able to be contacted for collateral information, so to confirm the diagnosis, will need further investigation during serial evaluations.  Given investigation conducted, where there is significant incongruency's between the patient's testimony and that of the [facility] department and the patient's mother, for safety and  stabilization of the patient, recommendation is for inpatient mental health hospitalization, under involuntary commitment. Patient historically has done well on Invega , as well as endorses that he has been medication compliant with Invega  at 6 mg p.o. daily and trazodone  50 mg nightly through [place], thus will continue this medication at this time.  Diagnoses:  Active Hospital problems: Principal Problem:   Psychosis (HCC) Active Problems:   Alcohol abuse    Plan   # Psychosis # Alcohol abuse  R/O schizophrenia decompensation R/O substance-induced psychosis  ## Psychiatric Medication Recommendations:   - Recommend continue Invega  6 mg p.o. daily - Recommend continue trazodone  50 mg p.o. daily  ## Medical Decision Making Capacity: Not specifically addressed in this encounter  ## Further Work-up: Recommend ethanol, UDS  ## Disposition:--Recommend inpatient mental health hospitalization under involuntary commitment  ## Behavioral / Environmental: -Strict agitation/safety precautions    ## Safety and Observation Level:  - Based on my clinical evaluation, I estimate the patient to be at low risk of self harm in the current setting. - At this time, we recommend  1:1 Observation. This decision is based on my review of the chart including patient's history and current presentation, interview of the patient, mental status examination, and consideration of suicide risk including evaluating suicidal ideation, plan, intent, suicidal or self-harm behaviors, risk factors, and protective factors. This judgment is based on our ability to directly address suicide risk, implement suicide prevention strategies, and develop a safety plan while the patient is in the clinical setting. Please contact our team if there is a concern that risk level has changed.  CSSR Risk  Category:   Suicide Risk Assessment: Patient has following modifiable risk factors for suicide: recklessness, active mental illness  (to encompass adhd, tbi, mania, psychosis, trauma reaction), current symptoms: anxiety/panic, insomnia, impulsivity, anhedonia, hopelessness, triggering events, and recent psychiatric hospitalization, which we are addressing by recommendations. Patient has following non-modifiable or demographic risk factors for suicide: male gender, separation or divorce, and psychiatric hospitalization Patient has the following protective factors against suicide: Access to outpatient mental health care, Supportive family, Supportive friends, Frustration tolerance, no history of suicide attempts, and no history of NSSIB  Thank you for this consult request. Recommendations have been communicated to the primary team.  We will continue to follow at this time.   Jerel JINNY Gravely, NP       History of Present Illness   Shane Allen is a 38 y.o. Caucasian male with a past history of schizophrenia and polysubstance abuse, with pertinent medical comorbidities/history that include recent laceration to leg with subsequent placement of staples this encounter, who presented this encounter by way of EMS and GPD under involuntary commitment, for concerns for psychosis, who upon EDP evaluation, consulted psychiatry for specialty evaluation and recommendations.  Patient is medically clear, per EDP team, as well as remains under involuntary commitment at this time.  Patient seen today at the [facility] emergency department for face-to-face psychiatric evaluation.  Upon evaluation, patient endorses that he was brought in this encounter because, I was just drunk and I hurt myself on accident in the woods so my mom freaked out and called the cops.  Prompted to expand on his endorsements of accidentally hurting himself, patient endorses that, I got super drunk last night and went walking in the woods and smashed my leg on a piece of metal somehow, and denies testimony given by [facility] department and the patient's mother  previously, who both endorse that the patient hurt himself in the context of causing property destruction at his and his mother's residence.  Prompted to expand on why he was out walking in the woods, patient endorses that, I don't know, just because, and when asked about endorsements given by [facility] department and his mother again about punching and kicking holes in the wall, and also becoming violent, and threatening to kill his mother, he states that none of these accusations are true, and states, my mother's disabled and must have just heard the neighbors getting loud and thought it was me so she called the cops; notably presents with multiple staples from severe laceration to his shin.  Prompted to discuss EtOH use, patient endorses that he consumed multiple 40 ounces of malt liquor yesterday evening, unable to exactly quantify he states.  Patient endorses that outside of yesterday evening he has not been consuming EtOH regularly, states, just at times when I want to get drunk and relax; no EtOH level obtained upon engagement this encounter and/or UDS.  Patient endorses no drug use recently, but endorses a history of utilizing cannabis, and endorses that he is not using tobacco, but does also have a history of using tobacco.  Patient endorses that he is currently sober, states that he does not feel intoxicated, and objectively, does not appear intoxicated and/or with withdrawal symptomology.  Patient orientation is intact, no concerns or fluctuations in consciousness.  Patient endorses no suicidal or homicidal ideations, denies history of suicide attempts, and denies any self-injurious behavior, past or present.  Patient endorses no auditory or visual hallucinations, and objectively, does not appear to be presenting with decompensation  into psychosis, with paranoid ideations, and/or overt delusional themes.  Patient endorses that he is seen for his mental health through [facility], states that  he cannot recall how long he has been being seen at [facility], but states that he compliantly takes Invega  6 mg by mouth daily, as well as trazodone  50 mg p.o. nightly.  Patient denies therapy services and affirms history of multiple inpatient mental health hospitalizations for decompensation of his mental health.  Patient endorses his mood currently is, good, and presents with an atypical interpersonal style, with an oddly constricted affect, fair eye contact, a disheveled and unkept presentation, and a delayed speech pattern, with a very superficial, reserved, and infrequently evasive appearing thought process.  Discussed with patient that given the events that transpired, involuntary commitment would be of held at this time, and that the patient would be recommended for inpatient mental health hospitalization, given collateral obtained from [facility] department and the patient's mother, and that it would be advisable for the patient to continue his current medication regimen, to which patient endorsed that he did not feel inpatient was necessary, but endorsed ultimately that he would cooperate.  Per IVC by [facility] department   The respondent is not taking medication as prescribed.  The respondent is very aggressive and punching holes in the walls and is aggressive towards his mother and stating his going to kill her.  The respondent has also harmed himself.  The respondent acted in a way that created a substantial risk of serious bodily harm and there is a reasonable probability that this contact will be repeated.  Patient's mother, Mrs. Donia Centers, attempted to be spoken to at [phone], multiple times without answer, unable to leave voicemail, due to voicemail being full  Review of Systems  Constitutional:  Negative for malaise/fatigue and weight loss.  Cardiovascular:  Negative for chest pain.  Gastrointestinal:  Negative for abdominal pain, constipation, diarrhea, nausea and vomiting.   Neurological:  Negative for dizziness, tremors, seizures, loss of consciousness and headaches.  Psychiatric/Behavioral:  Positive for substance abuse (ETOH). Negative for depression, hallucinations and suicidal ideas. The patient is not nervous/anxious and does not have insomnia.   All other systems reviewed and are negative.    Psychiatric and Social History  Psychiatric History:  Information collected from chart review/patient  Prev Dx/Sx: As above Current Psych Provider: Per patient, [facility] Meds (current): Invega  6 mg p.o. daily and 50 mg trazodone  at nighttime Previous Med Trials: Invega  and trazodone  Therapy: None  Prior Psych Hospitalization: Multiple, most recent July 2025 Prior Self Harm: None reported Prior Violence: Yes, just prior to this incident, per GPD  Family Psych History: None reported Family Hx suicide: None reported  Social History:  Developmental Hx: None reported Educational Hx: None reported Occupational Hx: SSDI Legal Hx: IVC Living Situation: Lives with mother Spiritual Hx: None reported Access to weapons/lethal means: Reported  Substance History Alcohol: Yes, reports heavy abuse just prior to coming in this encounter of malt liquor Type of alcohol: Mild liquor Last Drink: Just prior to coming in this encounter Number of drinks per day: just at times when I want to get drunk and relax History of alcohol withdrawal seizures: Denies History of DT's: Denies Tobacco: Denies, endorses long history of smoking, but reports quit sometime ago Illicit drugs: Denies current use, per chart review, history of cannabis use Prescription drug abuse: Denies Rehab hx: Denies  Exam Findings  Physical Exam: As below Vital Signs:  Temp:  [97.8 F (36.6 C)-98.6  F (37 C)] 98.6 F (37 C) (10/22 1406) Pulse Rate:  [85] 85 (10/22 1406) Resp:  [16-19] 19 (10/22 1406) BP: (113-134)/(72-86) 113/72 (10/22 1406) SpO2:  [100 %] 100 % (10/22 1406) Weight:   [90.7 kg] 90.7 kg (10/22 0551) Blood pressure 113/72, pulse 85, temperature 98.6 F (37 C), resp. rate 19, height 6' 2 (1.88 m), weight 90.7 kg, SpO2 100%. Body mass index is 25.67 kg/m.  Physical Exam Vitals and nursing note reviewed.  Constitutional:      General: He is not in acute distress.    Appearance: He is normal weight. He is not ill-appearing, toxic-appearing or diaphoretic.     Comments: Atypical interpersonal style; disheveled and unkept presentation   Pulmonary:     Effort: Pulmonary effort is normal.  Skin:    General: Skin is warm and dry.     Findings: Laceration Bolling) present.  Neurological:     Mental Status: He is alert and oriented to person, place, and time.     Motor: No weakness, tremor or seizure activity.  Psychiatric:        Attention and Perception: Attention and perception normal. He does not perceive auditory or visual hallucinations.        Speech: Speech is delayed.        Behavior: Behavior is cooperative.        Thought Content: Thought content is not paranoid or delusional. Thought content does not include homicidal or suicidal ideation.        Cognition and Memory: Cognition and memory normal.     Comments: Affect: Oddly constricted Mood: Good Speech: Atypical pattern, frequently halted Judgement: Grossly intact Thought content: normal     Mental Status Exam: General Appearance: Disheveled and unkept Caucasian male with normal bulk and tone with atypical interpersonal style  Orientation:  Full (Time, Place, and Person)  Memory:  Grossly intact  Concentration:  Concentration: Fair and Attention Span: Fair  Recall:  Fair  Attention  Fair  Eye Contact:  Fair  Speech:  Frequently delayed, abnormal pattern  Language:  Fair  Volume:  Normal  Mood: Good  Affect:  Oddly constricted  Thought Process:  Coherent and Goal Directed, superficial, frequently vague, appears evasive with some answering of questions   Thought Content:  Logical   Suicidal Thoughts:  No  Homicidal Thoughts:  No  Judgement:  Other:  Grossly intact  Insight:  Lacking and Shallow  Psychomotor Activity:  Normal  Akathisia:  No  Fund of Knowledge:  Grossly intact      Assets:  Architect Housing Leisure Time Physical Health Resilience Social Support Talents/Skills Transportation Vocational/Educational  Cognition:  WNL  ADL's:  Intact  AIMS (if indicated):   0     Other History   These have been pulled in through the EMR, reviewed, and updated if appropriate.  Family History:  The patient's family history includes Diabetes in his mother.  Medical History: Past Medical History:  Diagnosis Date  . Depression   . 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  [name] Fury affective schizophrenia [facility])     Surgical History: Past Surgical History:  Procedure Laterality Date  . NO PAST SURGERIES    . ORIF RADIAL FRACTURE Right 11/15/2022   Procedure: OPEN REDUCTION INTERNAL FIXATION (ORIF) ULNAR FRACTURE;  Surgeon: Genelle Standing, MD;  Location: [facility] OR;  Service: Orthopedics;  Laterality: Right;     Medications:  Current Facility-Administered Medications:  .  bacitracin ointment, , Topical, BID, Mesner, Selinda, MD, 1 Application at 12/15/23 1530 .  nicotine  polacrilex (NICORETTE ) gum 4 mg, 4 mg, Oral, PRN, Mesner, Jason, MD, 4 mg at 12/15/23 1244 .  OLANZapine  zydis (ZYPREXA ) disintegrating tablet 10 mg, 10 mg, Oral, Q8H PRN **AND** [DISCONTINUED] LORazepam  (ATIVAN ) tablet 1 mg, 1 mg, Oral, PRN **AND** ziprasidone  (GEODON ) injection 20 mg, 20 mg, Intramuscular, PRN, Jerrol Agent, MD .  oxyCODONE -acetaminophen  (PERCOCET/ROXICET) 5-325 MG per tablet 1 tablet, 1 tablet, Oral, Once, Garrick Charleston, MD .  sterile water  (preservative free) injection, , , ,   Current Outpatient Medications:  .  famotidine  (PEPCID ) 20 MG tablet, Take 1 tablet (20 mg  total) by mouth 2 (two) times daily., Disp: 30 tablet, Rfl: 0 .  paliperidone  (INVEGA ) 3 MG 24 hr tablet, Take 3 mg by mouth at bedtime., Disp: , Rfl:  .  sucralfate  (CARAFATE ) 1 g tablet, Take 1 tablet (1 g total) by mouth 4 (four) times daily -  with meals and at bedtime., Disp: 60 tablet, Rfl: 0 .  traZODone  (DESYREL ) 50 MG tablet, Take 50 mg by mouth at bedtime. (Patient not taking: Reported on 08/25/2023), Disp: , Rfl:   Allergies: Allergies  Allergen Reactions  . Haldol [Haloperidol Lactate] Other (See Comments)    Uncontrolled muscle movement  . Maalox [Calcium Carbonate Antacid] Other (See Comments)    Thinks this medication makes his face droop, but it could be another medication.    Jerel JINNY Gravely, NP

## 2023-12-15 NOTE — ED Notes (Signed)
 Pt has 6 visible staples to the right lower tibia

## 2023-12-15 NOTE — ED Notes (Signed)
 [facility] left without notifying this Charity fundraiser. IVC paperwork left at desk.

## 2023-12-15 NOTE — ED Notes (Signed)
 Patient came in IVC'd. First exam performed by Dr Lorette. All IVC docs e-filed. Envelope # [id number].

## 2023-12-15 NOTE — Progress Notes (Signed)
 Inpatient Psychiatric Referral  Patient was recommended inpatient per Jerel Gravely, NP. There are no available beds at [facility], per [facility]. Patient was referred to the following out of network facilities:  Destination  Service Provider Address Phone Fax  [facility]  [address] [place] [address] [phone]  [facility]  [address] [place], [place] [address] [phone]  [facility]  [address], [place] [address] [phone]  [facility]  [address], [place] [address] [phone]  [facility] EFAX  [address] [place], [place] [phone]  [facility]  [address], [place] [address] [phone]  [facility]  [address] [place] [address] [phone]    Situation ongoing, CSW to continue following and update chart as more information becomes available.   Harrie Sofia MSW, [place] 12/15/2023

## 2023-12-15 NOTE — ED Notes (Signed)
 Wallet given to security, security called to wand down pt. Pt refuses to remove shirt but is now wearing paper scrub pants.

## 2023-12-15 NOTE — ED Triage Notes (Addendum)
 Pt BIB [facility] from home c/o aggressive behavior. Pt family states pt is schizophrenic and non-compliant with medications. Per EMS, pt was punching holes in walls and tearing up his apartment. Per EMS, pt is having auditory hallucinations. Upon assessment, pt has lac to RLE.  1L fluids given en route 122/72 94 NSR

## 2023-12-15 NOTE — ED Provider Notes (Addendum)
  Physical Exam  BP 134/86   Pulse 85   Temp 97.8 F (36.6 C) (Oral)   Resp 16   Ht 6' 2 (1.88 m)   Wt 90.7 kg   SpO2 100%   BMI 25.67 kg/m   Physical Exam  Procedures  Procedures  ED Course / MDM    Medical Decision Making Amount and/or Complexity of Data Reviewed Labs: ordered. Radiology: ordered.  Risk OTC drugs. Prescription drug management.   53M, hx of schizophrenia, under IVC, hallucinating, kicking holes in his trailer, lac to leg stapled. Waiting on medical clearance after patient clinically sober for TTS consult. Pt medically cleared for TTS on reassessment, resting comfortably, does not appear intoxicated, occasionally wandering but verbally redirectable.          Jerrol Agent, MD 12/15/23 [phone]

## 2023-12-16 MED ORDER — ACETAMINOPHEN 325 MG PO TABS
650.0000 mg | ORAL_TABLET | Freq: Once | ORAL | Status: AC
  Administered 2023-12-16: 650 mg via ORAL
  Filled 2023-12-16: qty 2

## 2023-12-16 NOTE — ED Notes (Signed)
 Shane Allen - New assignment at [facility]

## 2023-12-16 NOTE — ED Notes (Addendum)
 Belongings given to sheriff wallete,shorts, etc. Pt leaving for [facility] with sheriff.

## 2023-12-16 NOTE — ED Provider Notes (Signed)
 Emergency Medicine Observation Re-evaluation Note  Shane Allen is a 38 y.o. male, seen on rounds today.  Pt initially presented to the ED for complaints of Hallucinations Currently, the patient is recommended for inpatient psychiatry and is under involuntary commitment.  Physical Exam  BP 117/81 (BP Location: Right Arm)   Pulse 78   Temp 97.9 F (36.6 C) (Oral)   Resp 18   Ht 1.88 m (6' 2)   Wt 90.7 kg   SpO2 99%   BMI 25.67 kg/m  Physical Exam General: Well-developed male no acute distress Cardiac: Normal heart rate and blood pressure Lungs: Normal respiratory rate and oxygen saturation Psych: Patient resting at this time  ED Course / MDM  EKG:EKG Interpretation Date/Time:  Wednesday December 15 2023 05:48:14 EDT Ventricular Rate:  79 PR Interval:  155 QRS Duration:  97 QT Interval:  361 QTC Calculation: 414 R Axis:   84  Text Interpretation: Sinus rhythm Confirmed by Jerrol Agent ([id number]) on 12/15/2023 7:22:13 AM  I have reviewed the labs performed to date as well as medications administered while in observation.  Recent changes in the last 24 hours include patient was seen by psychiatry and recommended for inpatient.  Plan  Current plan is for inpatient psychiatry admission and has been accepted to [facility]. EMTALA completed    Levander Houston, MD 12/16/23 [phone]

## 2023-12-16 NOTE — ED Notes (Signed)
 Staffing called, no sitters available at this time. Charge RN aware.

## 2024-01-03 ENCOUNTER — Encounter (HOSPITAL_COMMUNITY): Payer: Self-pay

## 2024-01-03 ENCOUNTER — Emergency Department (HOSPITAL_COMMUNITY)
Admission: EM | Admit: 2024-01-03 | Discharge: 2024-01-03 | Disposition: A | Discharge status: Home / Self Care | Payer: MEDICAID

## 2024-01-03 ENCOUNTER — Other Ambulatory Visit: Payer: Self-pay

## 2024-01-03 DIAGNOSIS — Z5902 Unsheltered homelessness: Secondary | ICD-10-CM | POA: Diagnosis not present

## 2024-01-03 DIAGNOSIS — Z4802 Encounter for removal of sutures: Secondary | ICD-10-CM | POA: Insufficient documentation

## 2024-01-03 MED ORDER — IBUPROFEN 800 MG PO TABS
800.0000 mg | ORAL_TABLET | Freq: Once | ORAL | Status: AC
  Administered 2024-01-03: 800 mg via ORAL
  Filled 2024-01-03: qty 1

## 2024-01-03 MED ORDER — IBUPROFEN 800 MG PO TABS: 800.0000 mg | ORAL_TABLET | Freq: Three times a day (TID) | ORAL | 0 refills | Status: AC | PRN

## 2024-01-03 NOTE — ED Triage Notes (Signed)
 Pt is here to have staples removed to right leg.

## 2024-01-03 NOTE — Discharge Instructions (Addendum)
 Return if any problems.

## 2024-01-03 NOTE — ED Provider Notes (Signed)
   EMERGENCY DEPARTMENT AT [facility] Provider Note   CSN: [id number] Arrival date & time: 01/03/24  1416     Patient presents with: Suture / Staple Removal   Shane Allen is a 38 y.o. male.   Pt here for staple removal.  Pt reports he had staples placed 10/22.  Pt reports he has had some discomfort with walking.  Pt reports the cold weather is causing discomfort as well.  Pt requesting a sandwich.  Pt reports he has been living in his car.   The history is provided by the patient. No language interpreter was used.  Suture / Staple Removal The problem has not changed since onset.Nothing aggravates the symptoms. Nothing relieves the symptoms. He has tried nothing for the symptoms.       Prior to Admission medications   Medication Sig Start Date End Date Taking? Authorizing Provider  paliperidone  (INVEGA ) 3 MG 24 hr tablet Take 3 mg by mouth at bedtime. 07/13/23   [provider]  traZODone  (DESYREL ) 50 MG tablet Take 50 mg by mouth at bedtime. 06/02/23   [provider]  asenapine  (SAPHRIS ) 5 MG SUBL 24 hr tablet Place 1 tablet (5 mg total) under the tongue 2 (two) times daily. 06/15/17 05/18/19  Norris Will PARAS, PA-C  risperiDONE  (RISPERDAL ) 3 MG tablet Take 1 tablet (3 mg total) by mouth 2 (two) times daily for 7 days. 01/29/19 05/18/19  Neldon Hamp RAMAN, PA    Allergies: Haldol [haloperidol lactate] and Maalox [calcium carbonate antacid]    Review of Systems  Skin:  Positive for wound.  All other systems reviewed and are negative.   Updated Vital Signs BP (!) 136/92 (BP Location: Left Arm)   Pulse 83   Temp 98.4 F (36.9 C) (Oral)   Resp 16   SpO2 98%   Physical Exam Vitals reviewed.  Constitutional:      Appearance: Normal appearance.  Musculoskeletal:        General: Normal range of motion.  Skin:    Comments: Healing laceration right leg, no sign of infection   Neurological:     General: No focal deficit present.      Mental Status: He is alert.     (all labs ordered are listed, but only abnormal results are displayed) Labs Reviewed - No data to display  EKG: None  Radiology: No results found.   Procedures   Medications Ordered in the ED - No data to display                                  Medical Decision Making       Final diagnoses:  Encounter for staple removal    ED Discharge Orders     None      An After Visit Summary was printed and given to the patient.     Flint Sonny POUR, PA-C 01/03/24 1450    Ula Prentice SAUNDERS, MD 01/03/24 [phone]

## 2024-03-08 ENCOUNTER — Emergency Department (HOSPITAL_COMMUNITY)
Admission: EM | Admit: 2024-03-08 | Discharge: 2024-03-09 | Disposition: A | Discharge status: Home / Self Care | Payer: MEDICAID | Attending: Emergency Medicine | Admitting: Emergency Medicine

## 2024-03-08 ENCOUNTER — Other Ambulatory Visit: Payer: Self-pay

## 2024-03-08 ENCOUNTER — Encounter (HOSPITAL_COMMUNITY): Payer: Self-pay

## 2024-03-08 DIAGNOSIS — F209 Schizophrenia, unspecified: Secondary | ICD-10-CM | POA: Diagnosis present

## 2024-03-08 DIAGNOSIS — F29 Unspecified psychosis not due to a substance or known physiological condition: Secondary | ICD-10-CM | POA: Insufficient documentation

## 2024-03-08 LAB — CBC
HCT: 43 % (ref 39.0–52.0)
Hemoglobin: 14.7 g/dL (ref 13.0–17.0)
MCH: 32.8 pg (ref 26.0–34.0)
MCHC: 34.2 g/dL (ref 30.0–36.0)
MCV: 96 fL (ref 80.0–100.0)
Platelets: 333 K/uL (ref 150–400)
RBC: 4.48 MIL/uL (ref 4.22–5.81)
RDW: 12.5 % (ref 11.5–15.5)
WBC: 10.2 K/uL (ref 4.0–10.5)
nRBC: 0 % (ref 0.0–0.2)

## 2024-03-08 LAB — COMPREHENSIVE METABOLIC PANEL WITH GFR
ALT: 14 U/L (ref 0–44)
AST: 19 U/L (ref 15–41)
Albumin: 4.6 g/dL (ref 3.5–5.0)
Alkaline Phosphatase: 83 U/L (ref 38–126)
Anion gap: 11 (ref 5–15)
BUN: 8 mg/dL (ref 6–20)
CO2: 27 mmol/L (ref 22–32)
Calcium: 9.9 mg/dL (ref 8.9–10.3)
Chloride: 100 mmol/L (ref 98–111)
Creatinine, Ser: 0.92 mg/dL (ref 0.61–1.24)
GFR, Estimated: >60 mL/min
Glucose, Bld: 87 mg/dL (ref 70–99)
Potassium: 4.2 mmol/L (ref 3.5–5.1)
Sodium: 138 mmol/L (ref 135–145)
Total Bilirubin: 0.4 mg/dL (ref 0.0–1.2)
Total Protein: 7.4 g/dL (ref 6.5–8.1)

## 2024-03-08 LAB — URINE DRUG SCREEN
Amphetamines: NEGATIVE
Barbiturates: NEGATIVE
Benzodiazepines: NEGATIVE
Cocaine: NEGATIVE
Fentanyl: NEGATIVE
Methadone Scn, Ur: NEGATIVE
Opiates: NEGATIVE
Tetrahydrocannabinol: NEGATIVE

## 2024-03-08 LAB — ETHANOL: Alcohol, Ethyl (B): <15 mg/dL

## 2024-03-08 MED ORDER — LORAZEPAM 2 MG/ML IJ SOLN
2.0000 mg | Freq: Once | INTRAMUSCULAR | Status: AC
  Administered 2024-03-08: 2 mg via INTRAMUSCULAR
  Filled 2024-03-08: qty 1

## 2024-03-08 MED ORDER — STERILE WATER FOR INJECTION IJ SOLN
INTRAMUSCULAR | Status: AC
  Administered 2024-03-08: 1.2 mL
  Filled 2024-03-08: qty 10

## 2024-03-08 MED ORDER — ZIPRASIDONE MESYLATE 20 MG IM SOLR
20.0000 mg | Freq: Once | INTRAMUSCULAR | Status: AC
  Administered 2024-03-08: 20 mg via INTRAMUSCULAR
  Filled 2024-03-08: qty 20

## 2024-03-08 NOTE — ED Notes (Signed)
 Belongings placed in locker 2 by tech. Phone and wallet given to security by tech.

## 2024-03-08 NOTE — ED Notes (Signed)
 Awaiting patient from lobby.

## 2024-03-08 NOTE — ED Provider Notes (Signed)
 " [place] EMERGENCY DEPARTMENT AT [facility] Provider Note   CSN: [id number] Arrival date & time: 03/08/24  1714     Patient presents with: Manic Behavior   Shane Allen is a 39 y.o. male.   39 y.o male with a PMH of Psychosis, Schizophrenia presents to the ED via EMS with a chief complaint of behavioral issues per family. When EMS arrived patient told them he had drink three cups of coffee and felt like his heart was beating so fast. He was cooperative to staff but then he was not able to follow directions. He tells me he does not take any of his medications, has not been for sometime because the psychiatrist cleared him. He denies any visual or auditory hallucinations. No SI or Hi reported to me.   The history is provided by the patient.       Prior to Admission medications  Medication Sig Start Date End Date Taking? Authorizing Provider  ibuprofen  (ADVIL ) 800 MG tablet Take 1 tablet (800 mg total) by mouth every 8 (eight) hours as needed. 01/03/24   Sofia, Leslie K, PA-C  paliperidone  (INVEGA ) 3 MG 24 hr tablet Take 3 mg by mouth at bedtime. 07/13/23   [provider]  traZODone  (DESYREL ) 50 MG tablet Take 50 mg by mouth at bedtime. 06/02/23   [provider]  asenapine  (SAPHRIS ) 5 MG SUBL 24 hr tablet Place 1 tablet (5 mg total) under the tongue 2 (two) times daily. 06/15/17 05/18/19  Norris Will PARAS, PA-C  risperiDONE  (RISPERDAL ) 3 MG tablet Take 1 tablet (3 mg total) by mouth 2 (two) times daily for 7 days. 01/29/19 05/18/19  Neldon Hamp RAMAN, PA    Allergies: Haldol [haloperidol lactate] and Maalox [calcium carbonate antacid]    Review of Systems  Constitutional:  Negative for fever.  Respiratory:  Negative for shortness of breath.   Cardiovascular:  Positive for palpitations. Negative for chest pain.  Gastrointestinal:  Negative for abdominal pain, nausea and vomiting.  Neurological:  Negative for light-headedness and headaches.   Psychiatric/Behavioral:  Negative for hallucinations, self-injury and suicidal ideas. The patient is nervous/anxious.   All other systems reviewed and are negative.   Updated Vital Signs BP 117/74 (BP Location: Right Arm)   Pulse 77   Temp 98 F (36.7 C) (Oral)   Resp 18   SpO2 100%   Physical Exam Vitals and nursing note reviewed.  Constitutional:      Appearance: Normal appearance.  HENT:     Head: Normocephalic and atraumatic.     Mouth/Throat:     Mouth: Mucous membranes are moist.  Pulmonary:     Effort: Pulmonary effort is normal.  Abdominal:     General: Abdomen is flat.  Musculoskeletal:     Cervical back: Normal range of motion and neck supple.  Skin:    General: Skin is warm and dry.  Neurological:     Mental Status: He is alert and oriented to person, place, and time.  Psychiatric:        Mood and Affect: Mood is anxious.        Speech: Speech is delayed.        Behavior: Behavior is withdrawn.        Thought Content: Thought content normal.     (all labs ordered are listed, but only abnormal results are displayed) Labs Reviewed  COMPREHENSIVE METABOLIC PANEL WITH GFR  ETHANOL  CBC  URINE DRUG SCREEN    EKG: None  Radiology: No results found.   Procedures   Medications Ordered in the ED  ziprasidone  (GEODON ) injection 20 mg (20 mg Intramuscular Given 03/08/24 1835)  LORazepam  (ATIVAN ) injection 2 mg (2 mg Intramuscular Given 03/08/24 1836)  sterile water  (preservative free) injection (1.2 mLs  Given 03/08/24 1835)                                    Medical Decision Making Amount and/or Complexity of Data Reviewed Labs: ordered.  Risk Prescription drug management.   This patient presents to the ED for concern of acute psychosis, this involves a number of treatment options, and is a complaint that carries with it a high risk of complications and morbidity.  The differential diagnosis includes mental health disorder versus electrolyte  derangement.    Co morbidities: Discussed in HPI   Brief History:  See HPI.   EMR reviewed including pt PMHx, past surgical history and past visits to ER.   See HPI for more details   Lab Tests:  I ordered and independently interpreted labs.  The pertinent results include:    I personally reviewed all laboratory work and imaging. Metabolic panel without any acute abnormality specifically kidney function within normal limits and no significant electrolyte abnormalities. CBC without leukocytosis or significant anemia.UDS is negative for any substance.   Imaging Studies:  No imaging studies ordered for this patient  Medicines ordered:  I ordered medication including geodon , ativan   for agitation Reevaluation of the patient after these medicines showed that the patient improved I have reviewed the patients home medicines and have made adjustments as needed  Consults:  I requested consultation with TTS, pending....  Reevaluation:  After the interventions noted above I re-evaluated patient and found that they have :improved  Social Determinants of Health:  Patient from home with family concerns for psychosis.   Problem List / ED Course:  Patient presents to the ED with an acute episode of psychosis, was drinking multiple cups of coffee and felt his heart rate beating really fast. He has a prior hx of mental health including schizoaffective, psychosis is supposed to be on multiple medications however he has not been taking any medications as he reports I was told I do not need them .  He comes in today due to ongoing palpitations for his increase in caffeine intake.  When asked questions patient does respond, we asked him about medications however he became very agitated in anxious.  We discussed obtaining labs.  Geodon  and Ativan  was given as patient became somewhat anxious and irritated.  Laboratory results are within normal limits today, CMP is unremarkable, CBC with no  leukocytosis. UDS is negative, ethanol is also negative. Patient given food along with TTS consultation placed as patient is medically clear.    Dispostion:  Will need TTS consultation, if patient tries to eloped will need to consider IVC.   Portions of this note were generated with Scientist, clinical (histocompatibility and immunogenetics). Dictation errors may occur despite best attempts at proofreading.   Final diagnoses:  Psychosis, unspecified psychosis type [facility])    ED Discharge Orders     None          Analyce Tavares, PA-C 03/22/24 [id number]    Ruthe Cornet, DO 03/22/24 [phone]  "

## 2024-03-08 NOTE — ED Triage Notes (Signed)
 Pt arrived via [facility] from home for behavioral issues. Pt states he drank 3 cups of coffee and felt like his heart was beating too fast. Per EMS, pt has been acting strange for them. Pt would not sit still and kept trying to wander off when they first arrived. Pt has been sitting quietly in [facility] for me.

## 2024-03-08 NOTE — ED Notes (Signed)
 [place]Tina from staffing was called about getting a sitter down for patient.  Currently no sitters at this time.  Duwaine, charge RN notified and aware as well.

## 2024-03-08 NOTE — ED Notes (Addendum)
Security at bedside- patient wanded.

## 2024-03-09 MED ORDER — NICOTINE POLACRILEX 2 MG MT GUM
2.0000 mg | CHEWING_GUM | OROMUCOSAL | Status: DC | PRN
  Administered 2024-03-09: 2 mg via ORAL
  Filled 2024-03-09: qty 1
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
## 2024-03-09 NOTE — BH Assessment (Signed)
 TTS attempted to see pt. Per John, RN, telehealth cart is currently in use. TTS will be notified once cart is available for assessment.

## 2024-03-09 NOTE — ED Provider Notes (Signed)
" °  Physical Exam  BP 117/74 (BP Location: Right Arm)   Pulse 77   Temp 98 F (36.7 C) (Oral)   Resp 18   SpO2 100%   Physical Exam  Procedures  Procedures  ED Course / MDM    Medical Decision Making Amount and/or Complexity of Data Reviewed Labs: ordered.  Risk OTC drugs. Prescription drug management.   Patient signed out at shift handoff pending TTS evaluation. Patient here voluntarily with possible psychosis.   Patient remains cooperative.  TTS recommends outpatient psychiatric follow-up and has cleared the patient from their point of view for discharge per Richerd Ivans, NP.   Patient stable for discharge home. Patient is requesting social work as patient was transported via EMS from [place] and has no means to return home. TOC consult placed for potential assistance with transportation. Patient will be discharged to the lobby at this time.        Shane Allen Shane Allen 03/09/24 0138    Shane Lenis, MD 03/09/24 0231  "

## 2024-03-09 NOTE — Discharge Instructions (Addendum)
 Please follow up with behavioral health for further management as needed. Return to the emergency department if you develop any life threatening symptoms.
